# Patient Record
Sex: Male | Born: 1945 | Race: White | Hispanic: No | Smoking: Former smoker
Health system: Southern US, Community
[De-identification: ages and names within clinical notes are randomized; demographics above are authoritative.]

## PROBLEM LIST (undated history)

## (undated) DIAGNOSIS — S065XAA Traumatic subdural hemorrhage with loss of consciousness status unknown, initial encounter: Secondary | ICD-10-CM

## (undated) DIAGNOSIS — S065X9A Traumatic subdural hemorrhage with loss of consciousness of unspecified duration, initial encounter: Secondary | ICD-10-CM

## (undated) DIAGNOSIS — Z960 Presence of urogenital implants: Secondary | ICD-10-CM

## (undated) DIAGNOSIS — R569 Unspecified convulsions: Secondary | ICD-10-CM

## (undated) DIAGNOSIS — R339 Retention of urine, unspecified: Secondary | ICD-10-CM

## (undated) DIAGNOSIS — J439 Emphysema, unspecified: Secondary | ICD-10-CM

## (undated) DIAGNOSIS — F419 Anxiety disorder, unspecified: Secondary | ICD-10-CM

## (undated) DIAGNOSIS — Z978 Presence of other specified devices: Secondary | ICD-10-CM

---

## 2017-08-19 ENCOUNTER — Emergency Department
Admission: EM | Admit: 2017-08-19 | Discharge: 2017-08-19 | Disposition: A | Discharge status: Home / Self Care | Payer: Self-pay | Attending: Emergency Medicine | Admitting: Emergency Medicine

## 2017-08-19 ENCOUNTER — Encounter: Payer: Self-pay | Admitting: Emergency Medicine

## 2017-08-19 ENCOUNTER — Other Ambulatory Visit: Payer: Self-pay

## 2017-08-19 DIAGNOSIS — Z87891 Personal history of nicotine dependence: Secondary | ICD-10-CM | POA: Insufficient documentation

## 2017-08-19 DIAGNOSIS — R339 Retention of urine, unspecified: Secondary | ICD-10-CM | POA: Insufficient documentation

## 2017-08-19 LAB — URINALYSIS, COMPLETE (UACMP) WITH MICROSCOPIC
Bacteria, UA: NONE SEEN
Bilirubin Urine: NEGATIVE
GLUCOSE, UA: NEGATIVE mg/dL
HGB URINE DIPSTICK: NEGATIVE
Ketones, ur: NEGATIVE mg/dL
Leukocytes, UA: NEGATIVE
NITRITE: NEGATIVE
PH: 6 (ref 5.0–8.0)
PROTEIN: NEGATIVE mg/dL
SPECIFIC GRAVITY, URINE: 1.006 (ref 1.005–1.030)
Squamous Epithelial / LPF: NONE SEEN

## 2017-08-19 NOTE — ED Notes (Signed)
Pt stated he felt the urge to urinate. Pt taken to restroom but was unable to pass urine at this time. Pt did experience an episode of bowel incontinence before making it to the restroom.

## 2017-08-19 NOTE — ED Notes (Signed)
Bladder scan 273 ml

## 2017-08-19 NOTE — ED Notes (Signed)
  Esign not working. Pt verbalized discharge instructions and has no questions at this time. 

## 2017-08-19 NOTE — ED Triage Notes (Signed)
Pt presents to ED with urinary retention. Pt states he had a catheter removed around 1330 at the [facility] this afternoon and pt has been unable to void since then. Pt denies pain at this time.

## 2017-08-19 NOTE — ED Provider Notes (Signed)
[facility] Emergency Department Provider Note  ____________________________________________  Time seen: Approximately 11:01 PM  I have reviewed the triage vital signs and the nursing notes.   HISTORY  Chief Complaint Urinary Retention  Level 5 Caveat: Portions of the History and Physical are unable to be obtained due to patient being a poor historian   HPI Alexander Lara is a 71 y.o. male comes to the ED due to urinary retention. Patient previously had an indwelling Foley, had his Foley catheter removed today at about 1 PM [facility]. He was told if he didn't urinate within 4 hours he needed to come back to the [facility] for further care. However, after about 6 hours at home without being up to urinate, the patient came here instead. Denies any pain. No fevers chills or sweats. Otherwise at baseline, which does include confusion and PTSD. Patient has a history of enlarged prostate and according to the family member at bedside complicated history of side effects with medications.  He had been on a antibiotic for UTI for the past week, last doses today.  History reviewed. No pertinent past medical history. PTSD Enlarged prostate Urinary retention  There are no active problems to display for this patient.    History reviewed. No pertinent surgical history.   Prior to Admission medications   Not on File  Mirtazapine Tamsulosin   Allergies Patient has no known allergies.   No family history on file.  Social History Social History   Tobacco Use  . Smoking status: Former Games developermoker  . Smokeless tobacco: Never Used  Substance Use Topics  . Alcohol use: No    Frequency: Never  . Drug use: No    Review of Systems  Constitutional:   No fever or chills.  . Cardiovascular:   No chest pain or syncope. Respiratory:   No dyspnea or cough. Gastrointestinal:   Negative for abdominal pain, vomiting and diarrhea.   Musculoskeletal:   Negative for focal pain or swelling All other systems reviewed and are negative except as documented above in ROS and HPI.  ____________________________________________   PHYSICAL EXAM:  VITAL SIGNS: ED Triage Vitals  Enc Vitals Group     BP 08/19/17 2037 132/79     Pulse Rate 08/19/17 2037 78     Resp 08/19/17 2037 18     Temp 08/19/17 2037 (!) 97.5 F (36.4 C)     Temp Source 08/19/17 2037 Oral     SpO2 08/19/17 2037 100 %     Weight 08/19/17 2038 88 lb (39.9 kg)     Height 08/19/17 2038 5\' 6"  (1.676 m)     Head Circumference --      Peak Flow --      Pain Score 08/19/17 2037 0     Pain Loc --      Pain Edu? --      Excl. in GC? --     Vital signs reviewed, nursing assessments reviewed.   Constitutional:   Alert and oriented. Well appearing and in no distress. Eyes:   No scleral icterus.  EOMI. [name]. ENT   Head:   Normocephalic and atraumatic.   Nose:   No congestion/rhinnorhea.    Mouth/Throat:   MMM, no pharyngeal erythema. No peritonsillar mass.    Neck:   No meningismus. Full ROM. Hematological/Lymphatic/Immunilogical:   No cervical lymphadenopathy. Cardiovascular:   RRR. Symmetric bilateral radial and DP pulses.  No murmurs.  Respiratory:  Normal respiratory effort without tachypnea/retractions. Breath sounds are clear and equal bilaterally. No wheezes/rales/rhonchi. Gastrointestinal:   Soft and nontender. Bladder nonpalpable. Non distended. There is no CVA tenderness.  No rebound, rigidity, or guarding. Genitourinary:   deferred Musculoskeletal:   Normal range of motion in all extremities. No joint effusions.  No lower extremity tenderness.  No edema. Neurologic:   Normal speech and language.  Motor grossly intact. No gross focal neurologic deficits are appreciated.  Skin:    Skin is warm, dry and intact. No rash noted.  No petechiae, purpura, or bullae.  ____________________________________________    LABS (pertinent  positives/negatives) (all labs ordered are listed, but only abnormal results are displayed) Labs Reviewed  URINE CULTURE  URINALYSIS, COMPLETE (UACMP) WITH MICROSCOPIC   ____________________________________________   EKG    ____________________________________________    RADIOLOGY  No results found.  ____________________________________________   PROCEDURES Procedures  ____________________________________________     CLINICAL IMPRESSION / ASSESSMENT AND PLAN / ED COURSE  Pertinent labs & imaging results that were available during my care of the patient were reviewed by me and considered in my medical decision making (see chart for details).   Patient presents with urinary retention, going 8 hours without being able to urinate after removing a Foley today. Vital signs are unremarkable, patient is otherwise at apparent baseline. Bladder scan shows about 270 mL's. Patient was assisted to the bathroom a few times, was able to have bowel movements but not able to urinate. Therefore, Foley catheter was placed again. We'll send urinalysis for test of cure. Otherwise patient is suitable [facility] discharge home [facility]. Patient is not septic, he is well appearing and not in distress.      ____________________________________________   FINAL CLINICAL IMPRESSION(S) / ED DIAGNOSES    Final diagnoses:  Urinary retention      This SmartLink is deprecated. Use AVSMEDLIST instead to display the medication list for a patient.   Portions of this note were generated with dragon dictation software. Dictation errors may occur despite best attempts at proofreading.    Sharman CheekStafford, Nakya Weyand, MD 08/19/17 [phone]

## 2017-08-21 LAB — URINE CULTURE

## 2018-04-26 ENCOUNTER — Emergency Department
Admission: EM | Admit: 2018-04-26 | Discharge: 2018-04-26 | Disposition: A | Discharge status: Home / Self Care | Payer: Self-pay | Attending: Emergency Medicine | Admitting: Emergency Medicine

## 2018-04-26 ENCOUNTER — Other Ambulatory Visit: Payer: Self-pay

## 2018-04-26 DIAGNOSIS — Z466 Encounter for fitting and adjustment of urinary device: Secondary | ICD-10-CM | POA: Insufficient documentation

## 2018-04-26 DIAGNOSIS — Z87891 Personal history of nicotine dependence: Secondary | ICD-10-CM | POA: Insufficient documentation

## 2018-04-26 NOTE — ED Provider Notes (Signed)
[facility] Emergency Department Provider Note   ____________________________________________   First MD Initiated Contact with Patient 04/26/18 1341     (approximate)  I have reviewed the triage vital signs and the nursing notes.   HISTORY  Chief Complaint Urinary Retention  Chief complaint is needs Foley replaced HPI Alexander HamburgerRonnie Jamil is a 72 y.o. male who was at the nursing home he was scheduled to have his Foley catheter replaced.  They took out the open but could not get the new went into the center here.  Here in the emergency room we replaced the Foley without difficulty.  Patient tolerated procedure well.  We will send him back.   No past medical history on file.  There are no active problems to display for this patient.   No past surgical history on file.  Prior to Admission medications   Not on File    Allergies Patient has no known allergies.  No family history on file.  Social History Social History   Tobacco Use  . Smoking status: Former Games developermoker  . Smokeless tobacco: Never Used  Substance Use Topics  . Alcohol use: No    Frequency: Never  . Drug use: No    Review of Systems Constitutional patient reports he feels well.  Head patient denies sore throat.   Chest patient denies chest pain or shortness of breath.   Abdomen patient denies any significant abdominal pain.  ____________________________________________   PHYSICAL EXAM:  VITAL SIGNS: ED Triage Vitals  Enc Vitals Group     BP      Pulse      Resp      Temp      Temp src      SpO2      Weight      Height      Head Circumference      Peak Flow      Pain Score      Pain Loc      Pain Edu?      Excl. in GC?     Constitutional: Alert  Well appearing and in no acute distress. Eyes: Conjunctivae are normal. . Head: Atraumatic. Nose: No congestion/rhinnorhea. Mouth/Throat: Mucous membranes are moist.   Neck: No stridor Cardiovascular: Normal rate,  regular rhythm. Grossly normal heart sounds.  Good peripheral circulation. Respiratory: Normal respiratory effort.  No retractions. Lungs CTAB. Gastrointestinal: Soft minimal epigastric pain to deep palpation. No distention. No abdominal bruits.  Musculoskeletal: No lower extremity tenderness nor edema.   Neurologic:  Normal speech and language. No new gross focal neurologic deficits are appreciated. Skin:  Skin is warm, dry and intact.    ____________________________________________   LABS (all labs ordered are listed, but only abnormal results are displayed)  Labs Reviewed - No data to display ____________________________________________  EKG   ____________________________________________  RADIOLOGY  ED MD interpretation:   Official radiology report(s): No results found.  ____________________________________________   PROCEDURES  Procedure(s) performed:   Procedures  Critical Care performed:   ____________________________________________   INITIAL IMPRESSION / ASSESSMENT AND PLAN / ED COURSE  Foley catheter is replaced without difficulty patient tolerated procedure well we will return him to the nursing home for further care.         ____________________________________________   FINAL CLINICAL IMPRESSION(S) / ED DIAGNOSES  Final diagnoses:  Encounter for Foley catheter replacement     ED Discharge Orders    None       Note:  This document  was prepared using Conservation officer, historic buildingsDragon voice recognition software and may include unintentional dictation errors.    Arnaldo NatalMalinda, Sharon Rubis F, MD 04/26/18 [phone]

## 2018-04-26 NOTE — ED Triage Notes (Signed)
Pt arrived via ems with urinary retention in need of foley catheter placement. Pt NAD, respirations and non labored.

## 2018-04-26 NOTE — Discharge Instructions (Addendum)
Patient's Foley catheter has been replaced.  Please resume his regular care.

## 2018-05-14 ENCOUNTER — Encounter: Payer: Self-pay | Admitting: *Deleted

## 2018-05-14 ENCOUNTER — Emergency Department

## 2018-05-14 ENCOUNTER — Inpatient Hospital Stay
Admission: EM | Admit: 2018-05-14 | Discharge: 2018-05-18 | DRG: 698 | Disposition: A | Discharge status: Skilled Nursing Facility | Source: Skilled Nursing Facility | Attending: Internal Medicine | Admitting: Internal Medicine

## 2018-05-14 DIAGNOSIS — R338 Other retention of urine: Secondary | ICD-10-CM | POA: Diagnosis present

## 2018-05-14 DIAGNOSIS — G9341 Metabolic encephalopathy: Secondary | ICD-10-CM | POA: Diagnosis present

## 2018-05-14 DIAGNOSIS — N3000 Acute cystitis without hematuria: Secondary | ICD-10-CM | POA: Diagnosis present

## 2018-05-14 DIAGNOSIS — N390 Urinary tract infection, site not specified: Secondary | ICD-10-CM

## 2018-05-14 DIAGNOSIS — Y738 Miscellaneous gastroenterology and urology devices associated with adverse incidents, not elsewhere classified: Secondary | ICD-10-CM | POA: Diagnosis present

## 2018-05-14 DIAGNOSIS — Z87820 Personal history of traumatic brain injury: Secondary | ICD-10-CM

## 2018-05-14 DIAGNOSIS — E43 Unspecified severe protein-calorie malnutrition: Secondary | ICD-10-CM | POA: Diagnosis present

## 2018-05-14 DIAGNOSIS — Z79899 Other long term (current) drug therapy: Secondary | ICD-10-CM

## 2018-05-14 DIAGNOSIS — Z66 Do not resuscitate: Secondary | ICD-10-CM | POA: Diagnosis present

## 2018-05-14 DIAGNOSIS — D638 Anemia in other chronic diseases classified elsewhere: Secondary | ICD-10-CM | POA: Diagnosis present

## 2018-05-14 DIAGNOSIS — E876 Hypokalemia: Secondary | ICD-10-CM | POA: Diagnosis present

## 2018-05-14 DIAGNOSIS — Z7401 Bed confinement status: Secondary | ICD-10-CM

## 2018-05-14 DIAGNOSIS — B964 Proteus (mirabilis) (morganii) as the cause of diseases classified elsewhere: Secondary | ICD-10-CM | POA: Diagnosis present

## 2018-05-14 DIAGNOSIS — Z681 Body mass index (BMI) 19 or less, adult: Secondary | ICD-10-CM | POA: Diagnosis not present

## 2018-05-14 DIAGNOSIS — J439 Emphysema, unspecified: Secondary | ICD-10-CM | POA: Diagnosis present

## 2018-05-14 DIAGNOSIS — J189 Pneumonia, unspecified organism: Secondary | ICD-10-CM | POA: Diagnosis present

## 2018-05-14 DIAGNOSIS — G40909 Epilepsy, unspecified, not intractable, without status epilepticus: Secondary | ICD-10-CM | POA: Diagnosis present

## 2018-05-14 DIAGNOSIS — F419 Anxiety disorder, unspecified: Secondary | ICD-10-CM | POA: Diagnosis present

## 2018-05-14 DIAGNOSIS — A4159 Other Gram-negative sepsis: Secondary | ICD-10-CM | POA: Diagnosis present

## 2018-05-14 DIAGNOSIS — T83511A Infection and inflammatory reaction due to indwelling urethral catheter, initial encounter: Secondary | ICD-10-CM | POA: Diagnosis present

## 2018-05-14 DIAGNOSIS — Y846 Urinary catheterization as the cause of abnormal reaction of the patient, or of later complication, without mention of misadventure at the time of the procedure: Secondary | ICD-10-CM | POA: Diagnosis present

## 2018-05-14 DIAGNOSIS — A419 Sepsis, unspecified organism: Secondary | ICD-10-CM

## 2018-05-14 DIAGNOSIS — Z87891 Personal history of nicotine dependence: Secondary | ICD-10-CM

## 2018-05-14 DIAGNOSIS — Y95 Nosocomial condition: Secondary | ICD-10-CM | POA: Diagnosis present

## 2018-05-14 HISTORY — DX: Unspecified convulsions: R56.9

## 2018-05-14 HISTORY — DX: Retention of urine, unspecified: R33.9

## 2018-05-14 HISTORY — DX: Presence of urogenital implants: Z96.0

## 2018-05-14 HISTORY — DX: Emphysema, unspecified: J43.9

## 2018-05-14 HISTORY — DX: Presence of other specified devices: Z97.8

## 2018-05-14 HISTORY — DX: Traumatic subdural hemorrhage with loss of consciousness of unspecified duration, initial encounter: S06.5X9A

## 2018-05-14 HISTORY — DX: Anxiety disorder, unspecified: F41.9

## 2018-05-14 HISTORY — DX: Traumatic subdural hemorrhage with loss of consciousness status unknown, initial encounter: S06.5XAA

## 2018-05-14 LAB — COMPREHENSIVE METABOLIC PANEL
ALBUMIN: 2.6 g/dL — AB (ref 3.5–5.0)
ALK PHOS: 64 U/L (ref 38–126)
ALT: 15 U/L (ref 0–44)
ANION GAP: 12 (ref 5–15)
AST: 22 U/L (ref 15–41)
BILIRUBIN TOTAL: 0.5 mg/dL (ref 0.3–1.2)
BUN: 25 mg/dL — AB (ref 8–23)
CALCIUM: 8.2 mg/dL — AB (ref 8.9–10.3)
CO2: 25 mmol/L (ref 22–32)
Chloride: 108 mmol/L (ref 98–111)
Creatinine, Ser: 0.89 mg/dL (ref 0.61–1.24)
GFR calc Af Amer: >60 mL/min (ref >60)
GFR calc non Af Amer: >60 mL/min (ref >60)
GLUCOSE: 136 mg/dL — AB (ref 70–99)
Potassium: 2.6 mmol/L — CL (ref 3.5–5.1)
Sodium: 145 mmol/L (ref 135–145)
TOTAL PROTEIN: 6.2 g/dL — AB (ref 6.5–8.1)

## 2018-05-14 LAB — URINALYSIS, COMPLETE (UACMP) WITH MICROSCOPIC
Bilirubin Urine: NEGATIVE
Glucose, UA: NEGATIVE mg/dL
Ketones, ur: NEGATIVE mg/dL
Nitrite: POSITIVE — AB
PROTEIN: 100 mg/dL — AB
RBC / HPF: >50 RBC/hpf — ABNORMAL HIGH (ref 0–5)
Specific Gravity, Urine: 1.014 (ref 1.005–1.030)
Squamous Epithelial / LPF: NONE SEEN (ref 0–5)
pH: 8 (ref 5.0–8.0)

## 2018-05-14 LAB — LACTIC ACID, PLASMA: Lactic Acid, Venous: 2.3 mmol/L (ref 0.5–1.9)

## 2018-05-14 LAB — CBC WITH DIFFERENTIAL/PLATELET
Basophils Absolute: 0 10*3/uL (ref 0–0.1)
Basophils Relative: 0 %
EOS ABS: 0 10*3/uL (ref 0–0.7)
EOS PCT: 0 %
HCT: 24.3 % — ABNORMAL LOW (ref 40.0–52.0)
Hemoglobin: 8 g/dL — ABNORMAL LOW (ref 13.0–18.0)
LYMPHS ABS: 0.8 10*3/uL — AB (ref 1.0–3.6)
Lymphocytes Relative: 10 %
MCH: 24.2 pg — AB (ref 26.0–34.0)
MCHC: 32.8 g/dL (ref 32.0–36.0)
MCV: 73.8 fL — ABNORMAL LOW (ref 80.0–100.0)
MONO ABS: 0.4 10*3/uL (ref 0.2–1.0)
MONOS PCT: 5 %
Neutro Abs: 7.3 10*3/uL — ABNORMAL HIGH (ref 1.4–6.5)
Neutrophils Relative %: 85 %
Platelets: 289 10*3/uL (ref 150–440)
RBC: 3.29 MIL/uL — ABNORMAL LOW (ref 4.40–5.90)
RDW: 19.5 % — ABNORMAL HIGH (ref 11.5–14.5)
WBC: 8.5 10*3/uL (ref 3.8–10.6)

## 2018-05-14 LAB — PROTIME-INR
INR: 1.24
Prothrombin Time: 15.5 seconds — ABNORMAL HIGH (ref 11.4–15.2)

## 2018-05-14 LAB — LIPASE, BLOOD: Lipase: 23 U/L (ref 11–51)

## 2018-05-14 LAB — APTT: APTT: 38 s — AB (ref 24–36)

## 2018-05-14 LAB — TROPONIN I: Troponin I: 0.03 ng/mL (ref <0.03)

## 2018-05-14 MED ORDER — LIDOCAINE HCL URETHRAL/MUCOSAL 2 % EX GEL
CUTANEOUS | Status: AC
  Filled 2018-05-14: qty 10

## 2018-05-14 MED ORDER — VANCOMYCIN HCL IN DEXTROSE 1-5 GM/200ML-% IV SOLN
1000.0000 mg | Freq: Once | INTRAVENOUS | Status: AC
  Administered 2018-05-14: 1000 mg via INTRAVENOUS
  Filled 2018-05-14: qty 200

## 2018-05-14 MED ORDER — SODIUM CHLORIDE 0.9 % IV SOLN
2.0000 g | Freq: Once | INTRAVENOUS | Status: AC
  Administered 2018-05-14: 2 g via INTRAVENOUS
  Filled 2018-05-14: qty 2

## 2018-05-14 MED ORDER — SODIUM CHLORIDE 0.9 % IV BOLUS
1000.0000 mL | Freq: Once | INTRAVENOUS | Status: AC
  Administered 2018-05-14: 1000 mL via INTRAVENOUS

## 2018-05-14 MED ORDER — LIDOCAINE HCL URETHRAL/MUCOSAL 2 % EX GEL
1.0000 "application " | Freq: Once | CUTANEOUS | Status: AC
  Administered 2018-05-14: 1 via URETHRAL

## 2018-05-14 NOTE — ED Notes (Signed)
Foley catheter in place upon arrival draining tea-colored, cloudy urine that has a strong odor.

## 2018-05-14 NOTE — ED Notes (Signed)
Unable to insert Foley with gentle pressure with a 16Fr catheter. Blood noted on the catheter upon removal. Dr. Scotty CourtStafford aware and ordered Coude catheter.

## 2018-05-14 NOTE — Progress Notes (Signed)
CODE SEPSIS - PHARMACY COMMUNICATION  **Broad Spectrum Antibiotics should be administered within 1 hour of Sepsis diagnosis**  Time Code Sepsis Called/Page Received: @ 2048  Antibiotics Ordered: Cefepime  Time of 1st antibiotic administration: @ 2241  Additional action taken by pharmacy: N/A  If necessary, Name of Provider/Nurse Contacted: N/A  Gardner CandleSheema M Harini Dearmond, PharmD, BCPS Clinical Pharmacist 05/14/2018 10:41 PM

## 2018-05-14 NOTE — ED Provider Notes (Signed)
[facility] Emergency Department Provider Note  ____________________________________________  Time seen: Approximately 10:49 PM  I have reviewed the triage vital signs and the nursing notes.   HISTORY  Chief Complaint Dysuria  Level 5 Caveat: Portions of the History and Physical including HPI and review of systems are unable to be completely obtained due to patient being a poor historian    HPI Alexander Lara is a 72 y.o. 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  No reported acute symptoms.  Patient denies pain.  He reports feeling cold.  Catheter dysfunction started today.  At the facility they reported doing a bladder scan which showed 300 mL's in the bladder.      Past Medical History:  Diagnosis Date  . Anxiety   . Emphysema of lung (HCC)   . Foley catheter in place   . Seizures (HCC)   . Subdural hematoma (HCC)   . Urinary retention      There are no active problems to display for this patient.    History reviewed. No pertinent surgical history.   Prior to Admission medications   Not on File     Allergies Patient has no known allergies.   No family history on file.  Social History Social History   Tobacco Use  . Smoking status: Former Games developermoker  . Smokeless tobacco: Never Used  Substance Use Topics  . Alcohol use: No    Frequency: Never  . Drug use: No    Review of Systems  Constitutional:   No fever or chills.  Cardiovascular:   No chest pain or syncope. Respiratory:   No dyspnea or cough. Gastrointestinal:   Negative for abdominal pain, vomiting and diarrhea.  Musculoskeletal:   Negative for focal pain or swelling All other systems reviewed and are negative except as documented above in ROS and HPI.  ____________________________________________   PHYSICAL EXAM:  VITAL  SIGNS: ED Triage Vitals  Enc Vitals Group     BP 05/14/18 2043 (!) 153/83     Pulse Rate 05/14/18 2043 (!) 107     Resp 05/14/18 2043 20     Temp 05/14/18 2043 (!) 100.4 F (38 C)     Temp Source 05/14/18 2043 Rectal     SpO2 05/14/18 2043 96 %     Weight 05/14/18 2045 99 lb 3.3 oz (45 kg)     Height 05/14/18 2045 5\' 11"  (1.803 m)     Head Circumference --      Peak Flow --      Pain Score 05/14/18 2241 0     Pain Loc --      Pain Edu? --      Excl. in GC? --     Vital signs reviewed, nursing assessments reviewed.   Constitutional:   Alert and oriented.  Ill-appearing.  Emaciated Eyes:   Conjunctivae are normal. EOMI. PERRL. ENT      Head:   Normocephalic and atraumatic.      Nose:   No congestion/rhinnorhea.       Mouth/Throat:   Dry mucous membranes, no pharyngeal erythema. No peritonsillar mass.       Neck:   No meningismus. Full ROM. Hematological/Lymphatic/Immunilogical:   No cervical lymphadenopathy. Cardiovascular:   Tachycardia heart rate 130. Symmetric bilateral radial and DP pulses.  No murmurs. Cap refill less than 2 seconds.  Respiratory:   Normal respiratory effort without tachypnea/retractions. Breath sounds are clear and equal bilaterally. No wheezes/rales/rhonchi. Gastrointestinal:   Soft with suprapubic tenderness. Non distended. There is no CVA tenderness.  No rebound, rigidity, or guarding.  Rectal exam shows brown stool, prostate not significantly enlarged Genitourinary:   Normal, Foley catheter in place Musculoskeletal:   Normal range of motion in all extremities. No joint effusions.  No lower extremity tenderness.  No edema. Neurologic:   Dysarthric speech.  Limited language range Motor grossly intact. No acute focal neurologic deficits are appreciated.  Skin:    Skin is warm, dry and intact. No rash noted.  No petechiae, purpura, or bullae.  ____________________________________________    LABS (pertinent positives/negatives) (all labs ordered are  listed, but only abnormal results are displayed) Labs Reviewed  LACTIC ACID, PLASMA - Abnormal; Notable for the following components:      Result Value   Lactic Acid, Venous 2.3 (*)    All other components within normal limits  COMPREHENSIVE METABOLIC PANEL - Abnormal; Notable for the following components:   Potassium 2.6 (*)    Glucose, Bld 136 (*)    BUN 25 (*)    Calcium 8.2 (*)    Total Protein 6.2 (*)    Albumin 2.6 (*)    All other components within normal limits  TROPONIN I - Abnormal; Notable for the following components:   Troponin I 0.03 (*)    All other components within normal limits  CBC WITH DIFFERENTIAL/PLATELET - Abnormal; Notable for the following components:   RBC 3.29 (*)    Hemoglobin 8.0 (*)    HCT 24.3 (*)    MCV 73.8 (*)    MCH 24.2 (*)    RDW 19.5 (*)    Neutro Abs 7.3 (*)    Lymphs Abs 0.8 (*)    All other components within normal limits  APTT - Abnormal; Notable for the following components:   aPTT 38 (*)    All other components within normal limits  PROTIME-INR - Abnormal; Notable for the following components:   Prothrombin Time 15.5 (*)    All other components within normal limits  CULTURE, BLOOD (ROUTINE X 2)  CULTURE, BLOOD (ROUTINE X 2)  URINE CULTURE  LIPASE, BLOOD  LACTIC ACID, PLASMA  URINALYSIS, COMPLETE (UACMP) WITH MICROSCOPIC   ____________________________________________   EKG    ____________________________________________    RADIOLOGY  Dg Chest Port 1 View  Result Date: 05/14/2018 CLINICAL DATA:  72 year old male with difficulty with Foley catheter for several days. EXAM: PORTABLE CHEST 1 VIEW COMPARISON:  None. FINDINGS: Patchy multifocal ill-defined airspace consolidation in the right mid to lower lung. Left lung is normal. No pleural effusions. No pneumothorax. No definite suspicious appearing pulmonary nodules or masses are noted. No evidence of pulmonary edema. Heart size is normal. Aortic atherosclerosis. IMPRESSION:  1. Patchy multifocal airspace disease throughout the right mid to lower lung concerning for bronchopneumonia likely within predominantly the right lower lobe. Followup PA and lateral chest X-ray is recommended in 3-4 weeks following trial of antibiotic therapy to ensure resolution and exclude underlying malignancy. 2. Aortic atherosclerosis. Electronically Signed   By: Trudie Reed M.D.   On: 05/14/2018 21:57    ____________________________________________   PROCEDURES .Critical Care Performed by: Sharman Cheek, MD Authorized by: Sharman Cheek, MD   Critical care provider statement:    Critical care time (minutes):  35   Critical care time was exclusive of:  Separately billable procedures and treating other patients  Critical care was necessary to treat or prevent imminent or life-threatening deterioration of the following conditions:  Sepsis   Critical care was time spent personally by me on the following activities:  Development of treatment plan with patient or surrogate, discussions with consultants, evaluation of patient's response to treatment, examination of patient, obtaining history from patient or surrogate, ordering and performing treatments and interventions, ordering and review of laboratory studies, ordering and review of radiographic studies, pulse oximetry, re-evaluation of patient's condition and review of old charts   Foley catheter insertion Performed by me at 22:30pm Multiple failed attempts by nursing Under usual sterile technique, attempted to pass 16 french coude tip - unsuccessful.   Attempted to pass 20 french coude tip, which passed easily.   ____________________________________________  DIFFERENTIAL DIAGNOSIS   Urinary tract infection, pneumonia, sepsis, dehydration, Foley catheter obstruction  CLINICAL IMPRESSION / ASSESSMENT AND PLAN / ED COURSE  Pertinent labs & imaging results that were available during my care of the patient were reviewed  by me and considered in my medical decision making (see chart for details).    Patient presents with tachycardia, fever, ill-appearing, likely urinary tract infection with grossly colonized and contaminated Foley catheter tubing.  Plan to replace the catheter, sepsis work-up.  Saline bolus and cefepime for healthcare/catheter related UTI suspected.  Clinical Course as of May 14 2302  Sat May 14, 2018  2152 Labs show hypokalemia, elevated lactic acid without shock. Meets criteria for severe sepsis, likely due to cystitis. Will plan to hospitalize for further management. Prior foley removed, will attempt to replace new foley at this time.    [PS]  2243 Foley catheter inserted by me, 20 french coude tip, due to nursing unable to insert catheter and inability to pass a 16 french coude.   [PS]    Clinical Course User Index [PS] Sharman Cheek, MD     ----------------------------------------- 11:03 PM on 05/14/2018 -----------------------------------------  Chest x-ray shows right-sided pneumonia.  Will cover for H CAP by adding on vancomycin to the cefepime.  Case discussed with hospitalist.  ____________________________________________   FINAL CLINICAL IMPRESSION(S) / ED DIAGNOSES    Final diagnoses:  Acute cystitis without hematuria  Urinary tract infection associated with indwelling urethral catheter, initial encounter (HCC)  Sepsis, due to unspecified organism (HCC)  HCAP (healthcare-associated pneumonia)     ED Discharge Orders    None      Portions of this note were generated with dragon dictation software. Dictation errors may occur despite best attempts at proofreading.    Sharman Cheek, MD 05/14/18 2303

## 2018-05-14 NOTE — ED Notes (Signed)
Report from sonja, rn.  

## 2018-05-14 NOTE — ED Notes (Signed)
Unable to insert Coude due to resistance. Dr. Scotty CourtSTafford aware.

## 2018-05-14 NOTE — ED Notes (Signed)
Patient's significant other is at bedside

## 2018-05-14 NOTE — ED Triage Notes (Signed)
Per EMS report, staff at [facility] states patient has had issues with his Foley Catheter for 3 days. Bladder scan was performed at the facility and obtained findings of 300ml per EMT report.

## 2018-05-14 NOTE — ED Notes (Signed)
Dr. Scotty CourtStafford at bedside to insert Coude catheter.

## 2018-05-14 NOTE — H&P (Signed)
[facility] - [place] at [facility]   PATIENT NAME: Alexander Lara    MR#:  [id number]  DATE OF BIRTH:  [birth year]  DATE OF ADMISSION:  05/14/2018  PRIMARY CARE PHYSICIAN: [facility]   REQUESTING/REFERRING PHYSICIAN:   CHIEF COMPLAINT:   Chief Complaint  Patient presents with  . Dysuria    HISTORY OF PRESENT ILLNESS: Valmore Arabie  is a 72 y.o. male, NH resident, with a known history of COPD, subdural hematoma, chronic urinary catheter, due to urinary retention. Patient was sent to emergency room from nursing home due to urinary catheter dysfunction/obstruction  noted by nursing home staff in the past few hours.  No reports of bleeding, vomiting or diarrhea.  No recent falls or trauma. He is currently confused, obtunded, unable to provide history.  Most of the information was taken from reviewing the medical chart and from discussion with emergency room physician and patient's fiance. Blood test done emergency room are notable for hemoglobin level at 8, potassium level 2.6, lactic acid level at 2.3.  WBCs 8.5.  Temperature was 100.3 in the emergency room. UA is positive for UTI and chest x-ray shows patchy multifocal airspace disease throughout the right mid to lower lung concerning for bronchopneumonia. Patient is admitted for further evaluation and treatment.  PAST MEDICAL HISTORY:   Past Medical History:  Diagnosis Date  . Anxiety   . Emphysema of lung (HCC)   . Foley catheter in place   . Seizures (HCC)   . Subdural hematoma (HCC)   . Urinary retention     PAST SURGICAL HISTORY: History reviewed. No pertinent surgical history.  SOCIAL HISTORY:  Social History   Tobacco Use  . Smoking status: Former Games developer  . Smokeless tobacco: Never Used  Substance Use Topics  . Alcohol use: No    Frequency: Never    FAMILY HISTORY: No family history on file.  DRUG ALLERGIES: No Known Allergies  REVIEW OF SYSTEMS:   Via  MEDICATIONS AT  HOME:  Prior to Admission medications   Medication Sig Start Date End Date Taking? Authorizing Provider  levETIRAcetam (KEPPRA) 500 MG tablet Take 500 mg by mouth 2 (two) times daily.   Yes [provider]      PHYSICAL EXAMINATION:   VITAL SIGNS: Blood pressure (!) 159/74, pulse 88, temperature (!) 100.4 F (38 C), temperature source Rectal, resp. rate 20, height 5\' 11"  (1.803 m), weight 45 kg, SpO2 94 %.  GENERAL:  72 y.o.-year-old patient lying in the bed with no acute distress.  He looks chronically ill, cachectic. EYES: Pupils equal, round, reactive to light and accommodation. No scleral icterus. Extraocular muscles intact.  HEENT: Head atraumatic, normocephalic. Oropharynx and nasopharynx clear.  NECK:  Supple, no jugular venous distention. No thyroid enlargement, no tenderness.  LUNGS: Normal breath sounds bilaterally, no wheezing, rales,rhonchi or crepitation. No use of accessory muscles of respiration.  CARDIOVASCULAR: S1, S2 normal. No S3/S4.  ABDOMEN: Soft, nontender, nondistended. Bowel sounds present. No organomegaly or mass.  EXTREMITIES: No pedal edema, cyanosis, or clubbing.  NEUROLOGIC EXAM: Is limited due to patient's altered mental status.  He is moving all his extremities, no focal weakness appreciated. PSYCHIATRIC: The patient is obtunded. SKIN: No obvious rash, lesion, or ulcer.   LABORATORY PANEL:   CBC Recent Labs  Lab 05/14/18 2056  WBC 8.5  HGB 8.0*  HCT 24.3*  PLT 289  MCV 73.8*  MCH 24.2*  MCHC 32.8  RDW 19.5*  LYMPHSABS 0.8*  MONOABS 0.4  EOSABS 0.0  BASOSABS 0.0   ------------------------------------------------------------------------------------------------------------------  Chemistries  Recent Labs  Lab 05/14/18 2056  NA 145  K 2.6*  CL 108  CO2 25  GLUCOSE 136*  BUN 25*  CREATININE 0.89  CALCIUM 8.2*  AST 22  ALT 15  ALKPHOS 64  BILITOT 0.5    ------------------------------------------------------------------------------------------------------------------ estimated creatinine clearance is 47.1 mL/min (by C-G formula based on SCr of 0.89 mg/dL). ------------------------------------------------------------------------------------------------------------------ No results for input(s): TSH, T4TOTAL, T3FREE, THYROIDAB in the last 72 hours.  Invalid input(s): FREET3   Coagulation profile Recent Labs  Lab 05/14/18 2056  INR 1.24   ------------------------------------------------------------------------------------------------------------------- No results for input(s): DDIMER in the last 72 hours. -------------------------------------------------------------------------------------------------------------------  Cardiac Enzymes Recent Labs  Lab 05/14/18 2056  TROPONINI 0.03*   ------------------------------------------------------------------------------------------------------------------ Invalid input(s): POCBNP  ---------------------------------------------------------------------------------------------------------------  Urinalysis    Component Value Date/Time   COLORURINE AMBER (A) 05/14/2018 2232   APPEARANCEUR CLOUDY (A) 05/14/2018 2232   LABSPEC 1.014 05/14/2018 2232   PHURINE 8.0 05/14/2018 2232   GLUCOSEU NEGATIVE 05/14/2018 2232   HGBUR MODERATE (A) 05/14/2018 2232   BILIRUBINUR NEGATIVE 05/14/2018 2232   KETONESUR NEGATIVE 05/14/2018 2232   PROTEINUR 100 (A) 05/14/2018 2232   NITRITE POSITIVE (A) 05/14/2018 2232   LEUKOCYTESUR LARGE (A) 05/14/2018 2232     RADIOLOGY: Dg Chest Port 1 View  Result Date: 05/14/2018 CLINICAL DATA:  72 year old male with difficulty with Foley catheter for several days. EXAM: PORTABLE CHEST 1 VIEW COMPARISON:  None. FINDINGS: Patchy multifocal ill-defined airspace consolidation in the right mid to lower lung. Left lung is normal. No pleural effusions. No pneumothorax.  No definite suspicious appearing pulmonary nodules or masses are noted. No evidence of pulmonary edema. Heart size is normal. Aortic atherosclerosis. IMPRESSION: 1. Patchy multifocal airspace disease throughout the right mid to lower lung concerning for bronchopneumonia likely within predominantly the right lower lobe. Followup PA and lateral chest X-ray is recommended in 3-4 weeks following trial of antibiotic therapy to ensure resolution and exclude underlying malignancy. 2. Aortic atherosclerosis. Electronically Signed   By: Trudie Reed M.D.   On: 05/14/2018 21:57    EKG: Orders placed or performed during the hospital encounter of 05/14/18  . EKG 12-Lead  . EKG 12-Lead    IMPRESSION AND PLAN:  1.  Acute encephalopathy, likely secondary to sepsis.  We will continue to monitor clinically closely while treating the underlying disorder.  Will start resuscitation with IV fluids and broad-spectrum IV antibiotics. 2.  HCAP, will start treatment with cefepime and vancomycin IV, nebulizer treatments and oxygen therapy. 3.  Acute UTI.  Will treat with broad-spectrum IV antibiotics while waiting for urine culture result. 4.  Hypokalemia.  Will replace potassium per protocol. 5.  History of recent subdural hematoma.  Avoid anticoagulants.   All the records are reviewed and case discussed with ED provider. Management plans discussed with the patient, family and they are in agreement.  CODE STATUS: DNR Advance Directive Documentation     Most Recent Value  Type of Advance Directive  Out of facility DNR (pink MOST or yellow form)  Pre-existing out of facility DNR order (yellow form or pink MOST form)  -  "MOST" Form in Place?  -       TOTAL TIME TAKING CARE OF THIS PATIENT: 50 minutes.    Cammy Copa M.D on 05/14/2018 at 11:30 PM  Between 7am to 6pm - Pager - [phone]  After 6pm go to [email or web address] - password [email or web address] [facility]  [place] [facility]  Regional Office  [phone]  CC: Primary care physician; [facility]

## 2018-05-14 NOTE — ED Notes (Signed)
Bladder scan shows .

## 2018-05-15 ENCOUNTER — Other Ambulatory Visit: Payer: Self-pay

## 2018-05-15 LAB — MAGNESIUM
MAGNESIUM: 2.3 mg/dL (ref 1.7–2.4)
Magnesium: 1.6 mg/dL — ABNORMAL LOW (ref 1.7–2.4)

## 2018-05-15 LAB — CBC
HCT: 22.2 % — ABNORMAL LOW (ref 40.0–52.0)
Hemoglobin: 7.6 g/dL — ABNORMAL LOW (ref 13.0–18.0)
MCH: 25.1 pg — AB (ref 26.0–34.0)
MCHC: 34.1 g/dL (ref 32.0–36.0)
MCV: 73.4 fL — AB (ref 80.0–100.0)
Platelets: 253 10*3/uL (ref 150–440)
RBC: 3.02 MIL/uL — ABNORMAL LOW (ref 4.40–5.90)
RDW: 19.9 % — AB (ref 11.5–14.5)
WBC: 9 10*3/uL (ref 3.8–10.6)

## 2018-05-15 LAB — BASIC METABOLIC PANEL
Anion gap: 8 (ref 5–15)
BUN: 21 mg/dL (ref 8–23)
CO2: 23 mmol/L (ref 22–32)
CREATININE: 0.71 mg/dL (ref 0.61–1.24)
Calcium: 7.9 mg/dL — ABNORMAL LOW (ref 8.9–10.3)
Chloride: 112 mmol/L — ABNORMAL HIGH (ref 98–111)
GFR calc Af Amer: >60 mL/min (ref >60)
GFR calc non Af Amer: >60 mL/min (ref >60)
Glucose, Bld: 98 mg/dL (ref 70–99)
POTASSIUM: 3.9 mmol/L (ref 3.5–5.1)
Sodium: 143 mmol/L (ref 135–145)

## 2018-05-15 LAB — PHOSPHORUS: PHOSPHORUS: 2.2 mg/dL — AB (ref 2.5–4.6)

## 2018-05-15 LAB — MRSA PCR SCREENING: MRSA by PCR: NEGATIVE

## 2018-05-15 LAB — GLUCOSE, CAPILLARY: Glucose-Capillary: 89 mg/dL (ref 70–99)

## 2018-05-15 LAB — LACTIC ACID, PLASMA: LACTIC ACID, VENOUS: 1.7 mmol/L (ref 0.5–1.9)

## 2018-05-15 MED ORDER — DOCUSATE SODIUM 100 MG PO CAPS
100.0000 mg | ORAL_CAPSULE | Freq: Two times a day (BID) | ORAL | Status: DC
  Administered 2018-05-15 – 2018-05-16 (×2): 100 mg via ORAL
  Filled 2018-05-15 (×3): qty 1

## 2018-05-15 MED ORDER — LEVETIRACETAM 100 MG/ML PO SOLN
500.0000 mg | Freq: Two times a day (BID) | ORAL | Status: DC
  Administered 2018-05-15 – 2018-05-18 (×7): 500 mg via ORAL
  Filled 2018-05-15 (×9): qty 5

## 2018-05-15 MED ORDER — IPRATROPIUM-ALBUTEROL 0.5-2.5 (3) MG/3ML IN SOLN
3.0000 mL | Freq: Four times a day (QID) | RESPIRATORY_TRACT | Status: DC
  Administered 2018-05-15 (×2): 3 mL via RESPIRATORY_TRACT
  Filled 2018-05-15 (×2): qty 3

## 2018-05-15 MED ORDER — IPRATROPIUM-ALBUTEROL 0.5-2.5 (3) MG/3ML IN SOLN: 3.0000 mL | RESPIRATORY_TRACT | Status: DC | PRN

## 2018-05-15 MED ORDER — HYDROCODONE-ACETAMINOPHEN 5-325 MG PO TABS
1.0000 | ORAL_TABLET | ORAL | Status: DC | PRN
  Administered 2018-05-16: 2 via ORAL
  Administered 2018-05-16: 1 via ORAL
  Administered 2018-05-16: 2 via ORAL
  Administered 2018-05-17: 1 via ORAL
  Administered 2018-05-17 – 2018-05-18 (×2): 2 via ORAL
  Filled 2018-05-15 (×4): qty 2
  Filled 2018-05-15: qty 1
  Filled 2018-05-15: qty 2

## 2018-05-15 MED ORDER — TAMSULOSIN HCL 0.4 MG PO CAPS
0.4000 mg | ORAL_CAPSULE | Freq: Every day | ORAL | Status: DC
  Administered 2018-05-15 – 2018-05-18 (×4): 0.4 mg via ORAL
  Filled 2018-05-15 (×4): qty 1

## 2018-05-15 MED ORDER — VITAMIN C 500 MG PO TABS
250.0000 mg | ORAL_TABLET | Freq: Two times a day (BID) | ORAL | Status: DC
  Administered 2018-05-15 – 2018-05-18 (×7): 250 mg via ORAL
  Filled 2018-05-15 (×8): qty 0.5

## 2018-05-15 MED ORDER — LEVETIRACETAM 500 MG PO TABS
500.0000 mg | ORAL_TABLET | Freq: Two times a day (BID) | ORAL | Status: DC
  Filled 2018-05-15: qty 1

## 2018-05-15 MED ORDER — POTASSIUM CHLORIDE 10 MEQ/100ML IV SOLN
10.0000 meq | INTRAVENOUS | Status: AC
  Administered 2018-05-15 (×6): 10 meq via INTRAVENOUS
  Filled 2018-05-15 (×6): qty 100

## 2018-05-15 MED ORDER — SODIUM CHLORIDE 0.9 % IV SOLN
2.0000 g | INTRAVENOUS | Status: DC
  Administered 2018-05-15 – 2018-05-16 (×2): 2 g via INTRAVENOUS
  Filled 2018-05-15 (×3): qty 2

## 2018-05-15 MED ORDER — IPRATROPIUM-ALBUTEROL 0.5-2.5 (3) MG/3ML IN SOLN
3.0000 mL | Freq: Three times a day (TID) | RESPIRATORY_TRACT | Status: DC
  Administered 2018-05-15 – 2018-05-18 (×10): 3 mL via RESPIRATORY_TRACT
  Filled 2018-05-15 (×10): qty 3

## 2018-05-15 MED ORDER — BISACODYL 5 MG PO TBEC: 5.0000 mg | DELAYED_RELEASE_TABLET | Freq: Every day | ORAL | Status: DC | PRN

## 2018-05-15 MED ORDER — ADULT MULTIVITAMIN W/MINERALS CH
1.0000 | ORAL_TABLET | Freq: Every day | ORAL | Status: DC
  Administered 2018-05-15 – 2018-05-17 (×2): 1 via ORAL
  Filled 2018-05-15 (×3): qty 1

## 2018-05-15 MED ORDER — TRAZODONE HCL 50 MG PO TABS
25.0000 mg | ORAL_TABLET | Freq: Every evening | ORAL | Status: DC | PRN
  Administered 2018-05-16 – 2018-05-17 (×2): 25 mg via ORAL
  Filled 2018-05-15 (×2): qty 1

## 2018-05-15 MED ORDER — HEPARIN SODIUM (PORCINE) 5000 UNIT/ML IJ SOLN
5000.0000 [IU] | Freq: Three times a day (TID) | INTRAMUSCULAR | Status: DC
  Administered 2018-05-15 – 2018-05-16 (×4): 5000 [IU] via SUBCUTANEOUS
  Filled 2018-05-15 (×8): qty 1

## 2018-05-15 MED ORDER — ONDANSETRON HCL 4 MG PO TABS: 4.0000 mg | ORAL_TABLET | Freq: Four times a day (QID) | ORAL | Status: DC | PRN

## 2018-05-15 MED ORDER — ACETAMINOPHEN 650 MG RE SUPP: 650.0000 mg | Freq: Four times a day (QID) | RECTAL | Status: DC | PRN

## 2018-05-15 MED ORDER — MAGNESIUM SULFATE 2 GM/50ML IV SOLN
2.0000 g | Freq: Once | INTRAVENOUS | Status: AC
  Administered 2018-05-15: 2 g via INTRAVENOUS
  Filled 2018-05-15: qty 50

## 2018-05-15 MED ORDER — ACETAMINOPHEN 325 MG PO TABS
650.0000 mg | ORAL_TABLET | Freq: Four times a day (QID) | ORAL | Status: DC | PRN
  Administered 2018-05-15: 650 mg via ORAL
  Filled 2018-05-15: qty 2

## 2018-05-15 MED ORDER — ONDANSETRON HCL 4 MG/2ML IJ SOLN: 4.0000 mg | Freq: Four times a day (QID) | INTRAMUSCULAR | Status: DC | PRN

## 2018-05-15 MED ORDER — SODIUM CHLORIDE 0.9 % IV SOLN
INTRAVENOUS | Status: DC
  Administered 2018-05-15 – 2018-05-17 (×5): via INTRAVENOUS

## 2018-05-15 MED ORDER — NEPRO/CARBSTEADY PO LIQD
237.0000 mL | Freq: Two times a day (BID) | ORAL | Status: DC
  Administered 2018-05-16 – 2018-05-18 (×5): 237 mL via ORAL

## 2018-05-15 MED ORDER — VANCOMYCIN HCL IN DEXTROSE 750-5 MG/150ML-% IV SOLN
750.0000 mg | INTRAVENOUS | Status: DC
  Administered 2018-05-15: 750 mg via INTRAVENOUS
  Filled 2018-05-15 (×2): qty 150

## 2018-05-15 MED ORDER — POTASSIUM PHOSPHATE MONOBASIC 500 MG PO TABS
500.0000 mg | ORAL_TABLET | ORAL | Status: AC
  Administered 2018-05-15 – 2018-05-16 (×4): 500 mg via ORAL
  Filled 2018-05-15 (×4): qty 1

## 2018-05-15 NOTE — Progress Notes (Signed)
DNR form was sent up from ED - came with patient from facility. Spoke to patient and fiance, Levon Hedger, about the form due to conflicting "full code" order in computer. Patient stated that he did not want to be a DNR - he wanted "full code" status and understood clearly what that meant when discussed. Full code is currently what is in the system - placed DNR form in chart to be sent back with patient upon discharge.

## 2018-05-15 NOTE — Consult Note (Signed)
PHARMACY CONSULT NOTE - INITIAL   Pharmacy Consult for Electrolyte Monitoring and Replacement   Indication: Hypokalemia   Labs: Recent Labs    05/14/18 2056  WBC 8.5  HGB 8.0*  HCT 24.3*  PLT 289  APTT 38*  CREATININE 0.89  MG 1.6*  ALBUMIN 2.6*  PROT 6.2*  AST 22  ALT 15  ALKPHOS 64  BILITOT 0.5   Potassium (mmol/L)  Date Value  05/14/2018 2.6 (LL)   Magnesium (mg/dL)  Date Value  09/98/3382 1.6 (L)   Calcium (mg/dL)  Date Value  50/53/9767 8.2 (L)   Albumin (g/dL)  Date Value  34/19/3790 2.6 (L)  ] Estimated Creatinine Clearance: 47.1 mL/min (by C-G formula based on SCr of 0.89 mg/dL).  Assessment: Pharmacy consulted for electrolyte monitoring and replacement in 1 male admitted with sepsis and found to have hypokalemia.   Goal of Therapy:  Electrolyte WNL   Plan:  8/31 PM Labs: K: 2.6, Mg: 1.6  Will order Magnesium 2g IV x 1 dose and KCL IV x 6.  Will recheck electrolyte @ 1000 on 9/1.  Pharmacy will continue to follow labs and replace electrolyte as needed.   Gardner Candle, PharmD, BCPS Clinical Pharmacist 05/15/2018 12:32 AM

## 2018-05-15 NOTE — Consult Note (Signed)
Pharmacy Antibiotic Note  Alexander Lara is a 72 y.o. male admitted on 05/14/2018 with sepsis secondary to PNA and UTI.  Pharmacy has been consulted for Vancomycin and Cefepime dosing. Patient received Vancomycin 1g IV and Cefepime 2g IV x 2 dose in ED.   Plan: Start Vancomycin 750 IV every 24 hours with 16 hour stack dosing.  Goal trough 15-20 mcg/mL. Calculated trough @ Css is 17. Trough level prior to 4th dose. Will monitor renal function and adjust dose as needed. MRSA PCR ordered- if negative, recommend discontinuation of vancomycin   Start Cefepime 2g IV every 24 hours.   Height: 5\' 11"  (180.3 cm) Weight: 99 lb 3.3 oz (45 kg) IBW/kg (Calculated) : 75.3  Temp (24hrs), Avg:100.4 F (38 C), Min:100.4 F (38 C), Max:100.4 F (38 C)  Recent Labs  Lab 05/14/18 2056  WBC 8.5  CREATININE 0.89  LATICACIDVEN 2.3*    Estimated Creatinine Clearance: 47.1 mL/min (by C-G formula based on SCr of 0.89 mg/dL).    No Known Allergies  Antimicrobials this admission: 8/31  Cefepime >>  8/31 Vancomycin >>   Microbiology results: 8/31 BCx: pending  8/31 UCx: pending 8/31 MRSA PCR: pending  Thank you for allowing pharmacy to be a part of this patient's care.  Gardner Candle, PharmD, BCPS Clinical Pharmacist 05/15/2018 12:27 AM

## 2018-05-15 NOTE — Progress Notes (Signed)
[facility] - [place] at [facility]                                                                                                                                                                                  Patient Demographics   Markevius Trombetta, is a 72 y.o. male, DOB - 10-24-1945, WUJ:[id number]  Admit date - 05/14/2018   Admitting Physician Cammy Copa, MD  Outpatient Primary MD [facility] the [facility]   LOS - 1  Subjective: Pt admitted with urinary retention due to foley being clogged, according to patient's fianc she had asked not to have his Foley changed at the facility due to patient having trouble with Foley placement before.  Patient this morning is more awake complains of just not feeling well.  Does not have any specific complaints    Review of Systems:   CONSTITUTIONAL: No documented fever. No fatigue, weakness. No weight gain, no weight loss.  EYES: No blurry or double vision.  ENT: No tinnitus. No postnasal drip. No redness of the oropharynx.  RESPIRATORY: No cough, no wheeze, no hemoptysis. No dyspnea.  CARDIOVASCULAR: No chest pain. No orthopnea. No palpitations. No syncope.  GASTROINTESTINAL: No nausea, no vomiting or diarrhea. No abdominal pain. No melena or hematochezia.  GENITOURINARY: No dysuria or hematuria.  ENDOCRINE: No polyuria or nocturia. No heat or cold intolerance.  HEMATOLOGY: No anemia. No bruising. No bleeding.  INTEGUMENTARY: No rashes. No lesions.  MUSCULOSKELETAL: No arthritis. No swelling. No gout.  NEUROLOGIC: No numbness, tingling, or ataxia. No seizure-type activity.  PSYCHIATRIC: No anxiety. No insomnia. No ADD.    Vitals:   Vitals:   05/15/18 0119 05/15/18 0425 05/15/18 0809 05/15/18 1218  BP: (!) 151/81 133/72  (!) 141/75  Pulse: 90 94 98 92  Resp: 18 18 16 18   Temp: 97.8 F (36.6 C) 98.5 F (36.9 C)  98.5 F (36.9 C)  TempSrc: Oral Oral    SpO2: 99% 97% 98% 98%  Weight:  43 kg     Height:        Wt Readings from Last 3 Encounters:  05/15/18 43 kg  04/26/18 45.4 kg  08/19/17 39.9 kg     Intake/Output Summary (Last 24 hours) at 05/15/2018 1241 Last data filed at 05/15/2018 9562 Gross per 24 hour  Intake 1674.87 ml  Output 550 ml  Net 1124.87 ml    Physical Exam:   GENERAL:chronically ill-appearing HEAD, EYES, EARS, NOSE AND THROAT: Atraumatic, normocephalic. Extraocular muscles are intact. Pupils equal and reactive to light. Sclerae anicteric. No conjunctival injection. No oro-pharyngeal erythema.  NECK: Supple. There is no jugular venous distention. No bruits, no lymphadenopathy,  no thyromegaly.  HEART: Regular rate and rhythm,. No murmurs, no rubs, no clicks.  LUNGS: Clear to auscultation bilaterally. No rales or rhonchi. No wheezes.  ABDOMEN: Soft, flat, nontender, nondistended. Has good bowel sounds. No hepatosplenomegaly appreciated.  EXTREMITIES: No evidence of any cyanosis, clubbing, or peripheral edema.  +2 pedal and radial pulses bilaterally.  NEUROLOGIC: The patient is alert, awake, and oriented x3 with no focal motor or sensory deficits appreciated bilaterally.  SKIN: Moist and warm with no rashes appreciated.  Psych: Not anxious, depressed LN: No inguinal LN enlargement    Antibiotics   Anti-infectives (From admission, onward)   Start     Dose/Rate Route Frequency Ordered Stop   05/15/18 2200  ceFEPIme (MAXIPIME) 2 g in sodium chloride 0.9 % 100 mL IVPB     2 g 200 mL/hr over 30 Minutes Intravenous Every 24 hours 05/15/18 0022     05/15/18 1600  vancomycin (VANCOCIN) IVPB 750 mg/150 ml premix     750 mg 150 mL/hr over 60 Minutes Intravenous Every 24 hours 05/15/18 0022     05/14/18 2315  vancomycin (VANCOCIN) IVPB 1000 mg/200 mL premix     1,000 mg 200 mL/hr over 60 Minutes Intravenous  Once 05/14/18 2302 05/15/18 0031   05/14/18 2100  ceFEPIme (MAXIPIME) 2 g in sodium chloride 0.9 % 100 mL IVPB     2 g 200 mL/hr over 30 Minutes  Intravenous  Once 05/14/18 2048 05/14/18 2253      Medications   Scheduled Meds: . docusate sodium  100 mg Oral BID  . feeding supplement (NEPRO CARB STEADY)  237 mL Oral BID BM  . heparin  5,000 Units Subcutaneous Q8H  . ipratropium-albuterol  3 mL Nebulization Q6H  . levETIRAcetam  500 mg Oral BID  . multivitamin with minerals  1 tablet Oral Daily  . tamsulosin  0.4 mg Oral Daily  . vitamin C  250 mg Oral BID   Continuous Infusions: . sodium chloride Stopped (05/15/18 0235)  . ceFEPime (MAXIPIME) IV    . potassium chloride 10 mEq (05/15/18 1226)  . vancomycin     PRN Meds:.acetaminophen **OR** acetaminophen, bisacodyl, HYDROcodone-acetaminophen, ondansetron **OR** ondansetron (ZOFRAN) IV, traZODone   Data Review:   Micro Results Recent Results (from the past 240 hour(s))  Blood Culture (routine x 2)     Status: None (Preliminary result)   Collection Time: 05/14/18  8:56 PM  Result Value Ref Range Status   Specimen Description BLOOD LEFT FOREARM  Final   Special Requests   Final    BOTTLES DRAWN AEROBIC AND ANAEROBIC Blood Culture adequate volume   Culture   Final    NO GROWTH < 12 HOURS Performed at [facility], [address] [place], [place] [address]    Report Status PENDING  Incomplete  Blood Culture (routine x 2)     Status: None (Preliminary result)   Collection Time: 05/14/18  9:15 PM  Result Value Ref Range Status   Specimen Description BLOOD RIGHT FOREARM  Final   Special Requests   Final    BOTTLES DRAWN AEROBIC AND ANAEROBIC Blood Culture adequate volume   Culture   Final    NO GROWTH < 12 HOURS Performed at [facility], [address], [place], [place] [address]    Report Status PENDING  Incomplete  MRSA PCR Screening     Status: None   Collection Time: 05/15/18  2:00 AM  Result Value Ref Range Status   MRSA by PCR  NEGATIVE NEGATIVE Final    Comment:        The GeneXpert MRSA Assay (FDA approved for NASAL specimens only),  is one component of a comprehensive MRSA colonization surveillance program. It is not intended to diagnose MRSA infection nor to guide or monitor treatment for MRSA infections. Performed at [facility], [address], [place], [place] [address]     Radiology Reports Dg Chest [place] 1 View  Result Date: 05/14/2018 CLINICAL DATA:  72 year old male with difficulty with Foley catheter for several days. EXAM: PORTABLE CHEST 1 VIEW COMPARISON:  None. FINDINGS: Patchy multifocal ill-defined airspace consolidation in the right mid to lower lung. Left lung is normal. No pleural effusions. No pneumothorax. No definite suspicious appearing pulmonary nodules or masses are noted. No evidence of pulmonary edema. Heart size is normal. Aortic atherosclerosis. IMPRESSION: 1. Patchy multifocal airspace disease throughout the right mid to lower lung concerning for bronchopneumonia likely within predominantly the right lower lobe. Followup PA and lateral chest X-ray is recommended in 3-4 weeks following trial of antibiotic therapy to ensure resolution and exclude underlying malignancy. 2. Aortic atherosclerosis. Electronically Signed   By: Trudie Reed M.D.   On: 05/14/2018 21:57     CBC Recent Labs  Lab 05/14/18 2056  WBC 8.5  HGB 8.0*  HCT 24.3*  PLT 289  MCV 73.8*  MCH 24.2*  MCHC 32.8  RDW 19.5*  LYMPHSABS 0.8*  MONOABS 0.4  EOSABS 0.0  BASOSABS 0.0    Chemistries  Recent Labs  Lab 05/14/18 2056  NA 145  K 2.6*  CL 108  CO2 25  GLUCOSE 136*  BUN 25*  CREATININE 0.89  CALCIUM 8.2*  MG 1.6*  AST 22  ALT 15  ALKPHOS 64  BILITOT 0.5   ------------------------------------------------------------------------------------------------------------------ estimated creatinine clearance is 45 mL/min (by C-G formula based on SCr of 0.89 mg/dL). ------------------------------------------------------------------------------------------------------------------ No results for  input(s): HGBA1C in the last 72 hours. ------------------------------------------------------------------------------------------------------------------ No results for input(s): CHOL, HDL, LDLCALC, TRIG, CHOLHDL, LDLDIRECT in the last 72 hours. ------------------------------------------------------------------------------------------------------------------ No results for input(s): TSH, T4TOTAL, T3FREE, THYROIDAB in the last 72 hours.  Invalid input(s): FREET3 ------------------------------------------------------------------------------------------------------------------ No results for input(s): VITAMINB12, FOLATE, FERRITIN, TIBC, IRON, RETICCTPCT in the last 72 hours.  Coagulation profile Recent Labs  Lab 05/14/18 2056  INR 1.24    No results for input(s): DDIMER in the last 72 hours.  Cardiac Enzymes Recent Labs  Lab 05/14/18 2056  TROPONINI 0.03*   ------------------------------------------------------------------------------------------------------------------ Invalid input(s): POCBNP    Assessment & Plan  Patient is 72 year old admitted with acute encephalopathy  1.  Acute encephalopathy due to sepsis-mental status now improved 2.    Sepsis due to H CAP as well as UTI 3. HCAP,  continue cefepime and vancomycin IV, nebulizer treatments and oxygen therapy. 4.  Acute UTI.    Await urine cultures I will start Flomax 4.  Hypokalemia.    Pharmacy consult for replacement of potassium electrolyte 5.  History of recent subdural hematoma.  Avoid anticoagulants.   Stable     Code Status Orders  (From admission, onward)         Start     Ordered   05/15/18 1121  Full code  Continuous     05/15/18 1120        Code Status History    Date Active Date Inactive Code Status Order ID Comments User Context   05/15/2018 0125 05/15/2018 1120 Full Code [id number]  Cammy Copa, MD Inpatient    Advance Directive  Documentation     Most Recent Value  Type of Advance Directive   Healthcare Power of Attorney  Pre-existing out of facility DNR order (yellow form or pink MOST form)  -  "MOST" Form in Place?  -           Consults none  DVT Prophylaxis SCDs  Lab Results  Component Value Date   PLT 289 05/14/2018     Time Spent in minutes   35 minutes greater than 50% of time spent in care coordination and counseling patient regarding the condition and plan of care.   Auburn Bilberry M.D on 05/15/2018 at 12:41 PM  Between 7am to 6pm - Pager - [phone]  After 6pm go to [email or web address] - Social research officer, government  [facility]   Office  [phone]

## 2018-05-15 NOTE — Consult Note (Signed)
PHARMACY CONSULT NOTE - INITIAL   Pharmacy Consult for Electrolyte Monitoring and Replacement   Indication: Hypokalemia   Labs: Recent Labs    05/14/18 2056 05/15/18 1235  WBC 8.5 9.0  HGB 8.0* 7.6*  HCT 24.3* 22.2*  PLT 289 253  APTT 38*  --   CREATININE 0.89 0.71  MG 1.6*  --   PHOS  --  2.2*  ALBUMIN 2.6*  --   PROT 6.2*  --   AST 22  --   ALT 15  --   ALKPHOS 64  --   BILITOT 0.5  --    Potassium (mmol/L)  Date Value  05/15/2018 3.9   Magnesium (mg/dL)  Date Value  06/06/3006 1.6 (L)   Calcium (mg/dL)  Date Value  62/26/3335 7.9 (L)   Albumin (g/dL)  Date Value  45/62/5638 2.6 (L)   Phosphorus (mg/dL)  Date Value  93/73/4287 2.2 (L)  ] Estimated Creatinine Clearance: 50 mL/min (by C-G formula based on SCr of 0.71 mg/dL).  Assessment: Pharmacy consulted for electrolyte monitoring and replacement in 45 male admitted with sepsis and found to have hypokalemia.   Goal of Therapy:  Electrolyte WNL   Plan:  Repeat BMP shows K now 3.9, phos 2.2 Will give KPhos neutra tab 500 mg po Q4H x 4 doses and recheck all electrolytes tomorrow with AM labs.   Carola Frost, PharmD, BCPS Clinical Pharmacist 05/15/2018 1:43 PM

## 2018-05-15 NOTE — Progress Notes (Signed)
Initial Nutrition Assessment  DOCUMENTATION CODES:   Severe malnutrition in context of chronic illness  INTERVENTION:   Recommend check iron/anemia labs including iron, transferrin, TIBC and ferritin.  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  GOAL:   Patient will meet greater than or equal to 90% of their needs  MONITOR:   PO intake, Supplement acceptance, Labs, Weight trends, Skin, I & O's  REASON FOR ASSESSMENT:   Malnutrition Screening Tool    ASSESSMENT:   72y.o. [facility], with a known history of COPD, subdural hematoma, chronic urinary catheter admitted for UTI, HCAP with sepsis    Met with pt in room today. Pt is a poor historian but reports good appetite and oral intake at baseline. Pt reports that he does drink chocolate Ensure. Per chart, pt appears to have weight gain pta but there is no recent weight history in chart to confirm. RD will order supplements and vitamins to help pt meet his estimated needs. Suspect pt is at high refeeding risk given his severe malnutrition. Pt would likely benefit from PEG tube placement and nutrition support. Pt noted to have microcytic anemia; recommend check iron/anemia labs including iron, transferrin, TIBC and ferritin to r/t deficiency.   Medications reviewed and include: colace, heparin, NaCl _0 /hr, cefepime, vancomycin  Labs reviewed: K 2.6(L), BUN 25(H), Ca 8.2(L), Mg 1.6(L) Hgb 8.0(L), Hct 24.3(L), MCV 73.8(L), MCH 24.2(L)  NUTRITION - FOCUSED PHYSICAL EXAM:    Most Recent Value  Orbital Region  Severe depletion  Upper Arm Region   Severe depletion  Thoracic and Lumbar Region  Severe depletion  Buccal Region  Severe depletion  Temple Region  Severe depletion  Clavicle Bone Region  Severe depletion  Clavicle and Acromion Bone Region  Severe depletion  Scapular Bone Region  Severe depletion  Dorsal Hand  Severe depletion  Patellar Region  Severe depletion  Anterior Thigh Region  Severe depletion  Posterior Calf Region  Severe depletion  Edema (RD Assessment)  None  Hair  Reviewed  Eyes  Reviewed  Mouth  Reviewed  Skin  Reviewed [[email or web address]]  Nails  Reviewed     Diet Order:   Diet Order            DIET - DYS 1 Room service appropriate? Yes; Fluid consistency: Nectar Thick  Diet effective now             EDUCATION NEEDS:   No education needs have been identified at this time  Skin:  Skin Assessment: Reviewed RN Assessment(ecchymosis )  Last BM:  8/29  Height:   Ht Readings from Last 1 Encounters:  05/14/18 _1  (1.803 m)    Weight:   Wt Readings from Last 1 Encounters:  05/15/18 43 kg    Ideal Body Weight:  78.2 kg  BMI:  Body mass index is 13.22 kg/m.  Estimated Nutritional Needs:   Kcal:  1600-1800kcal/day   Protein:  65-73g/day   Fluid:  >1.2L/day   CKoleen DistanceMS, RD, LDN Pager #- [phone]#- [phone] Hours Pager: [phone]

## 2018-05-15 NOTE — Progress Notes (Signed)
Advanced care plan.  Purpose of the Encounter: CODE STATUS  Parties in Attendance: Patient and his fiance  Patient's Decision Capacity: Intact  Subjective/Patient's story: Patient has a history of subdural hematoma, who is at nursing facility  I was requested to review patient's CODE STATUS by patient and fianc he was previously DNR  Objective/Medical story  I discussed with the patient regarding his desire for cardiac and pulmonary resuscitation  Goals of care determination: Full code    CODE STATUS: Patient reversed his CODE STATUS to full code   Time spent discussing advanced care planning: 16 minutes

## 2018-05-16 LAB — BASIC METABOLIC PANEL
Anion gap: 7 (ref 5–15)
BUN: 19 mg/dL (ref 8–23)
CALCIUM: 7.9 mg/dL — AB (ref 8.9–10.3)
CO2: 23 mmol/L (ref 22–32)
CREATININE: 0.69 mg/dL (ref 0.61–1.24)
Chloride: 113 mmol/L — ABNORMAL HIGH (ref 98–111)
GFR calc non Af Amer: >60 mL/min (ref >60)
Glucose, Bld: 95 mg/dL (ref 70–99)
Potassium: 3.8 mmol/L (ref 3.5–5.1)
SODIUM: 143 mmol/L (ref 135–145)

## 2018-05-16 LAB — PHOSPHORUS: PHOSPHORUS: 2.2 mg/dL — AB (ref 2.5–4.6)

## 2018-05-16 LAB — GLUCOSE, CAPILLARY: Glucose-Capillary: 77 mg/dL (ref 70–99)

## 2018-05-16 LAB — MAGNESIUM: MAGNESIUM: 2.1 mg/dL (ref 1.7–2.4)

## 2018-05-16 MED ORDER — POTASSIUM PHOSPHATES 15 MMOLE/5ML IV SOLN
20.0000 mmol | Freq: Once | INTRAVENOUS | Status: AC
  Administered 2018-05-16: 20 mmol via INTRAVENOUS
  Filled 2018-05-16: qty 6.67

## 2018-05-16 MED ORDER — MEGESTROL ACETATE 400 MG/10ML PO SUSP
800.0000 mg | Freq: Every day | ORAL | Status: DC
  Administered 2018-05-16 – 2018-05-18 (×3): 800 mg via ORAL
  Filled 2018-05-16 (×3): qty 20

## 2018-05-16 MED ORDER — DOCUSATE SODIUM 50 MG/5ML PO LIQD
100.0000 mg | Freq: Two times a day (BID) | ORAL | Status: DC
  Administered 2018-05-16 – 2018-05-18 (×4): 100 mg via ORAL
  Filled 2018-05-16 (×5): qty 10

## 2018-05-16 NOTE — Consult Note (Signed)
PHARMACY CONSULT NOTE  Pharmacy Consult for Electrolyte Monitoring and Replacement   Indication: Hypokalemia   Labs: Recent Labs    05/14/18 2056 05/15/18 1235 05/16/18 0359  WBC 8.5 9.0  --   HGB 8.0* 7.6*  --   HCT 24.3* 22.2*  --   PLT 289 253  --   APTT 38*  --   --   CREATININE 0.89 0.71 0.69  MG 1.6* 2.3 2.1  PHOS  --  2.2* 2.2*  ALBUMIN 2.6*  --   --   PROT 6.2*  --   --   AST 22  --   --   ALT 15  --   --   ALKPHOS 64  --   --   BILITOT 0.5  --   --    Potassium (mmol/L)  Date Value  05/16/2018 3.8   Magnesium (mg/dL)  Date Value  26/71/2458 2.1   Calcium (mg/dL)  Date Value  09/98/3382 7.9 (L)   Albumin (g/dL)  Date Value  50/53/9767 2.6 (L)   Phosphorus (mg/dL)  Date Value  34/19/3790 2.2 (L)  ] Estimated Creatinine Clearance: 43.6 mL/min (by C-G formula based on SCr of 0.69 mg/dL).  Assessment: Pharmacy consulted for electrolyte monitoring and replacement in 57 male admitted with sepsis and found to have hypokalemia.   Goal of Therapy:  Electrolyte WNL   Plan:  Repeat BMP shows K now 3.9, phos 2.2 Will give KPhos neutra tab 500 mg po Q4H x 4 doses and recheck all electrolytes tomorrow with AM labs.   9/2  K 3.8 Phos 2.2 Mag 2.1  Scr 0.69 Patient received all doses of previous PO KPHos order. Will order Potassium Phosphate 20 mmol IV x 1. Patient on Dysphagia 1 diet. Will f/u with am labs  Angelique Blonder, PharmD, BCPS Clinical Pharmacist 05/16/2018 7:32 AM

## 2018-05-16 NOTE — NC FL2 (Addendum)
[place] MEDICAID FL2 LEVEL OF CARE SCREENING TOOL     IDENTIFICATION  Patient Name: Alexander Lara Birthdate: [birth year] Sex: male Admission Date (Current Location): 05/14/2018  [place] and [place] Number:  Chiropodist and Address:  [facility], [address], [place], [place] [address]      Provider Number: [id number]  Attending Physician Name and Address:  Auburn Bilberry, MD  Relative Name and Phone Number:       Current Level of Care: Hospital Recommended Level of Care: Skilled Nursing Facility Prior Approval Number:    Date Approved/Denied:   PASRR Number:    Discharge Plan: SNF    Current Diagnoses: Patient Active Problem List   Diagnosis Date Noted  . Sepsis (HCC) 05/14/2018    Orientation RESPIRATION BLADDER Height & Weight     Self  Normal Incontinent Weight: 82 lb 10.8 oz (37.5 kg) Height:  5\' 11"  (180.3 cm)  BEHAVIORAL SYMPTOMS/MOOD NEUROLOGICAL BOWEL NUTRITION STATUS  (none) (none) Incontinent Diet(dysphagia 1)  AMBULATORY STATUS COMMUNICATION OF NEEDS Skin   Extensive Assist Verbally Normal                       Personal Care Assistance Level of Assistance  Dressing, Feeding, Bathing Bathing Assistance: Maximum assistance Feeding assistance: Maximum assistance Dressing Assistance: Maximum assistance     Functional Limitations Info             SPECIAL CARE FACTORS FREQUENCY                       Contractures Contractures Info: Not present    Additional Factors Info  Code Status Code Status Info: full             Current Medications (05/16/2018):  This is the current hospital active medication list Current Facility-Administered Medications  Medication Dose Route Frequency Provider Last Rate Last Dose  . 0.9 %  sodium chloride infusion   Intravenous Continuous Cammy Copa, MD 75 mL/hr at 05/16/18 0747    . acetaminophen (TYLENOL) tablet 650 mg  650 mg Oral Q6H PRN Cammy Copa, MD    650 mg at 05/15/18 1330   Or  . acetaminophen (TYLENOL) suppository 650 mg  650 mg Rectal Q6H PRN Cammy Copa, MD      . bisacodyl (DULCOLAX) EC tablet 5 mg  5 mg Oral Daily PRN Cammy Copa, MD      . ceFEPIme (MAXIPIME) 2 g in sodium chloride 0.9 % 100 mL IVPB  2 g Intravenous Q24H Gardner Candle, RPH   Stopped at 05/15/18 2320  . docusate sodium (COLACE) capsule 100 mg  100 mg Oral BID Cammy Copa, MD   100 mg at 05/16/18 1130  . feeding supplement (NEPRO CARB STEADY) liquid 237 mL  237 mL Oral BID BM Auburn Bilberry, MD   237 mL at 05/16/18 1131  . heparin injection 5,000 Units  5,000 Units Subcutaneous Q8H Cammy Copa, MD   5,000 Units at 05/16/18 1347  . HYDROcodone-acetaminophen (NORCO/VICODIN) 5-325 MG per tablet 1-2 tablet  1-2 tablet Oral Q4H PRN Cammy Copa, MD   2 tablet at 05/16/18 0457  . ipratropium-albuterol (DUONEB) 0.5-2.5 (3) MG/3ML nebulizer solution 3 mL  3 mL Nebulization TID Auburn Bilberry, MD   3 mL at 05/16/18 1343  . ipratropium-albuterol (DUONEB) 0.5-2.5 (3) MG/3ML nebulizer solution 3 mL  3 mL Nebulization Q4H PRN Auburn Bilberry, MD      . levETIRAcetam (KEPPRA)  100 MG/ML solution 500 mg  500 mg Oral BID Auburn Bilberry, MD   500 mg at 05/16/18 1131  . megestrol (MEGACE) 400 MG/10ML suspension 800 mg  800 mg Oral Daily Auburn Bilberry, MD   800 mg at 05/16/18 1130  . multivitamin with minerals tablet 1 tablet  1 tablet Oral Daily Auburn Bilberry, MD   1 tablet at 05/15/18 1224  . ondansetron (ZOFRAN) tablet 4 mg  4 mg Oral Q6H PRN Cammy Copa, MD       Or  . ondansetron [facility] injection 4 mg  4 mg Intravenous Q6H PRN Cammy Copa, MD      . potassium PHOSPHATE 20 mmol in dextrose 5 % 500 mL infusion  20 mmol Intravenous Once Auburn Bilberry, MD 84 mL/hr at 05/16/18 0836 20 mmol at 05/16/18 0836  . tamsulosin (FLOMAX) capsule 0.4 mg  0.4 mg Oral Daily Auburn Bilberry, MD   0.4 mg at 05/16/18 1130  . traZODone (DESYREL) tablet 25 mg  25 mg Oral QHS PRN  Cammy Copa, MD      . vitamin C (ASCORBIC ACID) tablet 250 mg  250 mg Oral BID Auburn Bilberry, MD   250 mg at 05/16/18 1130     Discharge Medications: Please see discharge summary for a list of discharge medications.  Relevant Imaging Results:  Relevant Lab Results:   Additional Information Followed By [facility], LCSW

## 2018-05-16 NOTE — Progress Notes (Signed)
[employer] [facility] [place] [facility]                                                                                                                                                                                  Patient Demographics   Alexander Lara, is a 72 y.o. male, DOB - 1946-02-20, QZE:[id number]  Admit date - 05/14/2018   Admitting Physician Cammy Copa, MD  Outpatient Primary MD for the [facility] is [facility]   LOS - 2  Subjective: Patient doing better currently has no complaints Still has some cough  Review of Systems:   CONSTITUTIONAL: No documented fever. No fatigue, weakness. No weight gain, no weight loss.  EYES: No blurry or double vision.  ENT: No tinnitus. No postnasal drip. No redness of the oropharynx.  RESPIRATORY: No cough, no wheeze, no hemoptysis. No dyspnea.  CARDIOVASCULAR: No chest pain. No orthopnea. No palpitations. No syncope.  GASTROINTESTINAL: No nausea, no vomiting or diarrhea. No abdominal pain. No melena or hematochezia.  GENITOURINARY: No dysuria or hematuria.  ENDOCRINE: No polyuria or nocturia. No heat or cold intolerance.  HEMATOLOGY: No anemia. No bruising. No bleeding.  INTEGUMENTARY: No rashes. No lesions.  MUSCULOSKELETAL: No arthritis. No swelling. No gout.  NEUROLOGIC: No numbness, tingling, or ataxia. No seizure-type activity.  PSYCHIATRIC: No anxiety. No insomnia. No ADD.    Vitals:   Vitals:   05/15/18 2203 05/16/18 0434 05/16/18 0810 05/16/18 1146  BP: 138/79 (!) 159/84  (!) 146/87  Pulse: 85 87  89  Resp: 20 20  12   Temp: 97.8 F (36.6 C) 98.3 F (36.8 C)  97.8 F (36.6 C)  TempSrc: Oral Oral  Oral  SpO2: 99% 99% 99% 97%  Weight:  37.5 kg    Height:        Wt Readings from Last 3 Encounters:  05/16/18 37.5 kg  04/26/18 45.4 kg  08/19/17 39.9 kg     Intake/Output Summary (Last 24 hours) at 05/16/2018 1302 Last data filed at 05/16/2018 1150 Gross per 24 hour  Intake 1645.74 ml  Output 1000  ml  Net 645.74 ml    Physical Exam:   GENERAL:chronically ill-appearing HEAD, EYES, EARS, NOSE AND THROAT: Atraumatic, normocephalic. Extraocular muscles are intact. Pupils equal and reactive to light. Sclerae anicteric. No conjunctival injection. No oro-pharyngeal erythema.  NECK: Supple. There is no jugular venous distention. No bruits, no lymphadenopathy, no thyromegaly.  HEART: Regular rate and rhythm,. No murmurs, no rubs, no clicks.  LUNGS: Clear to auscultation bilaterally. No rales or rhonchi. No wheezes.  ABDOMEN: Soft, flat, nontender, nondistended. Has good bowel sounds. No hepatosplenomegaly appreciated.  EXTREMITIES: No evidence of any cyanosis,  clubbing, or peripheral edema.  +2 pedal and radial pulses bilaterally.  NEUROLOGIC: The patient is alert, awake, and oriented x3 with no focal motor or sensory deficits appreciated bilaterally.  SKIN: Moist and warm with no rashes appreciated.  Psych: Not anxious, depressed LN: No inguinal LN enlargement    Antibiotics   Anti-infectives (From admission, onward)   Start     Dose/Rate Route Frequency Ordered Stop   05/15/18 2200  ceFEPIme (MAXIPIME) 2 g in sodium chloride 0.9 % 100 mL IVPB     2 g 200 mL/hr over 30 Minutes Intravenous Every 24 hours 05/15/18 0022     05/15/18 1600  vancomycin (VANCOCIN) IVPB 750 mg/150 ml premix  Status:  Discontinued     750 mg 150 mL/hr over 60 Minutes Intravenous Every 24 hours 05/15/18 0022 05/16/18 1023   05/14/18 2315  vancomycin (VANCOCIN) IVPB 1000 mg/200 mL premix     1,000 mg 200 mL/hr over 60 Minutes Intravenous  Once 05/14/18 2302 05/15/18 0031   05/14/18 2100  ceFEPIme (MAXIPIME) 2 g in sodium chloride 0.9 % 100 mL IVPB     2 g 200 mL/hr over 30 Minutes Intravenous  Once 05/14/18 2048 05/14/18 2253      Medications   Scheduled Meds: . docusate sodium  100 mg Oral BID  . feeding supplement (NEPRO CARB STEADY)  237 mL Oral BID BM  . heparin  5,000 Units Subcutaneous Q8H  .  ipratropium-albuterol  3 mL Nebulization TID  . levETIRAcetam  500 mg Oral BID  . megestrol  800 mg Oral Daily  . multivitamin with minerals  1 tablet Oral Daily  . tamsulosin  0.4 mg Oral Daily  . vitamin C  250 mg Oral BID   Continuous Infusions: . sodium chloride 75 mL/hr at 05/16/18 0747  . ceFEPime (MAXIPIME) IV Stopped (05/15/18 2320)  . potassium PHOSPHATE IVPB (in mmol) 20 mmol (05/16/18 0836)   PRN Meds:.acetaminophen **OR** acetaminophen, bisacodyl, HYDROcodone-acetaminophen, ipratropium-albuterol, ondansetron **OR** ondansetron (ZOFRAN) IV, traZODone   Data Review:   Micro Results Recent Results (from the past 240 hour(s))  Blood Culture (routine x 2)     Status: None (Preliminary result)   Collection Time: 05/14/18  8:56 PM  Result Value Ref Range Status   Specimen Description BLOOD LEFT FOREARM  Final   Special Requests   Final    BOTTLES DRAWN AEROBIC AND ANAEROBIC Blood Culture adequate volume   Culture   Final    NO GROWTH 2 DAYS Performed at [facility], [address], [place], [place] [address]    Report Status PENDING  Incomplete  Blood Culture (routine x 2)     Status: None (Preliminary result)   Collection Time: 05/14/18  9:15 PM  Result Value Ref Range Status   Specimen Description BLOOD RIGHT FOREARM  Final   Special Requests   Final    BOTTLES DRAWN AEROBIC AND ANAEROBIC Blood Culture adequate volume   Culture   Final    NO GROWTH 2 DAYS Performed at [facility], [address] [place], [place] [address]    Report Status PENDING  Incomplete  Urine culture     Status: Abnormal (Preliminary result)   Collection Time: 05/14/18 10:32 PM  Result Value Ref Range Status   Specimen Description   Final    URINE, RANDOM Performed at [facility], [address] [place], [place] [address]    Special Requests   Final    NONE Performed at [facility], [address], [place], [place] [address]    Culture (A)   Final    >=100,000 COLONIES/mL PROTEUS MIRABILIS SUSCEPTIBILITIES TO FOLLOW Performed at [facility], [address] [place], [place] [address]    Report Status PENDING  Incomplete  MRSA PCR Screening     Status: None   Collection Time: 05/15/18  2:00 AM  Result Value Ref Range Status   MRSA by PCR NEGATIVE NEGATIVE Final    Comment:        The GeneXpert MRSA Assay (FDA approved for NASAL specimens only), is one component of a comprehensive MRSA colonization surveillance program. It is not intended to diagnose MRSA infection nor to guide or monitor treatment for MRSA infections. Performed at [facility], [address], [place], [place] [address]     Radiology Reports Dg Chest [place] 1 View  Result Date: 05/14/2018 CLINICAL DATA:  72 year old male with difficulty with Foley catheter for several days. EXAM: PORTABLE CHEST 1 VIEW COMPARISON:  None. FINDINGS: Patchy multifocal ill-defined airspace consolidation in the right mid to lower lung. Left lung is normal. No pleural effusions. No pneumothorax. No definite suspicious appearing pulmonary nodules or masses are noted. No evidence of pulmonary edema. Heart size is normal. Aortic atherosclerosis. IMPRESSION: 1. Patchy multifocal airspace disease throughout the right mid to lower lung concerning for bronchopneumonia likely within predominantly the right lower lobe. Followup PA and lateral chest X-ray is recommended in 3-4 weeks following trial of antibiotic therapy to ensure resolution and exclude underlying malignancy. 2. Aortic atherosclerosis. Electronically Signed   By: Trudie Reed M.D.   On: 05/14/2018 21:57     CBC Recent Labs  Lab 05/14/18 2056 05/15/18 1235  WBC 8.5 9.0  HGB 8.0* 7.6*  HCT 24.3* 22.2*  PLT 289 253  MCV 73.8* 73.4*  MCH 24.2* 25.1*  MCHC 32.8 34.1  RDW 19.5* 19.9*  LYMPHSABS 0.8*  --   MONOABS 0.4  --   EOSABS 0.0  --   BASOSABS 0.0  --     Chemistries  Recent Labs  Lab  05/14/18 2056 05/15/18 1235 05/16/18 0359  NA 145 143 143  K 2.6* 3.9 3.8  CL 108 112* 113*  CO2 25 23 23   GLUCOSE 136* 98 95  BUN 25* 21 19  CREATININE 0.89 0.71 0.69  CALCIUM 8.2* 7.9* 7.9*  MG 1.6* 2.3 2.1  AST 22  --   --   ALT 15  --   --   ALKPHOS 64  --   --   BILITOT 0.5  --   --    ------------------------------------------------------------------------------------------------------------------ estimated creatinine clearance is 43.6 mL/min (by C-G formula based on SCr of 0.69 mg/dL). ------------------------------------------------------------------------------------------------------------------ No results for input(s): HGBA1C in the last 72 hours. ------------------------------------------------------------------------------------------------------------------ No results for input(s): CHOL, HDL, LDLCALC, TRIG, CHOLHDL, LDLDIRECT in the last 72 hours. ------------------------------------------------------------------------------------------------------------------ No results for input(s): TSH, T4TOTAL, T3FREE, THYROIDAB in the last 72 hours.  Invalid input(s): FREET3 ------------------------------------------------------------------------------------------------------------------ No results for input(s): VITAMINB12, FOLATE, FERRITIN, TIBC, IRON, RETICCTPCT in the last 72 hours.  Coagulation profile Recent Labs  Lab 05/14/18 2056  INR 1.24    No results for input(s): DDIMER in the last 72 hours.  Cardiac Enzymes Recent Labs  Lab 05/14/18 2056  TROPONINI 0.03*   ------------------------------------------------------------------------------------------------------------------ Invalid input(s): POCBNP    Assessment & Plan  Patient is 72 year old admitted with acute encephalopathy  1.  Acute encephalopathy due to sepsis-mental status now improved 2.    Sepsis  due to H CAP as well as UTI 3. HCAP,  continue cefepime discontinue vancomycin. 4.  Acute UTI.     With Proteus sensitive to cefepime continue that 4.  Hypokalemia.    Now replaced continue to monitor 5.  History of recent subdural hematoma.  Avoid full dose anticoagulants.   Stable     Code Status Orders  (From admission, onward)         Start     Ordered   05/15/18 1121  Full code  Continuous     05/15/18 1120        Code Status History    Date Active Date Inactive Code Status Order ID Comments User Context   05/15/2018 0125 05/15/2018 1120 Full Code [id number]  Cammy Copa, MD Inpatient    Advance Directive Documentation     Most Recent Value  Type of Advance Directive  Healthcare Power of Attorney  Pre-existing out of facility DNR order (yellow form or pink MOST form)  -  "MOST" Form in Place?  -           Consults none  DVT Prophylaxis heparin  Lab Results  Component Value Date   PLT 253 05/15/2018     Time Spent in minutes   35 minutes greater than 50% of time spent in care coordination and counseling patient regarding the condition and plan of care.   Auburn Bilberry M.D on 05/16/2018 at 1:02 PM  Between 7am to 6pm - Pager - [phone]  After 6pm go to [email or web address] - Social research officer, government  [employer]   Office  [phone]

## 2018-05-16 NOTE — Care Management (Signed)
Informed that Levon Hedger, significant other, would like Mr. Stupp transferred to [facility]. Spoke with Ms. Womack at the bedside. States that [employer], [place] (son) has power of attorney. States that son lives in [place], unable to find son's telephone number.  States that  Mr Gregori fell last November 2018 and was independent at that time. Larey Seat again March 2018. He became dependant on others for his basic needs at that time. Mr. Mahoney was at [facility] from March 19 th until June 12 th. He could get up and walk around at that time. States he gained 10 pounds, now at 76 pounds. Transferred to [facility] June 12 th.  [facility] is in place at [facility]. States that Hospice is suppose to release their services by the end of September. [facility] could [facility].  Spoke with Mr. Derbyshire at the bedside. Discussed transfer. Unable to make a decision at this time. Mr Bisbee hasn't been declared incompetent .  States that if Mr. Moreta can't transfer to [facility], she is O.K with going back to [facility] with an appetite stimulate. Gwenette Greet RN MSN CCM Care Management [phone]

## 2018-05-16 NOTE — Progress Notes (Signed)
Pt unable to tolerate incentive spirometer.

## 2018-05-16 NOTE — Evaluation (Signed)
Clinical/Bedside Swallow Evaluation Patient Details  Name: Alexander Lara MRN: [id number] Date of Birth: 03/29/1946  Today's Date: 05/16/2018 Time: SLP Start Time (ACUTE ONLY): 1010 SLP Stop Time (ACUTE ONLY): 1110 SLP Time Calculation (min) (ACUTE ONLY): 60 min  Past Medical History:  Past Medical History:  Diagnosis Date  . Anxiety   . Emphysema of lung (HCC)   . Foley catheter in place   . Seizures (HCC)   . Subdural hematoma (HCC)   . Urinary retention    Past Surgical History: History reviewed. No pertinent surgical history. HPI:   Pt is a 72 y.o. male, NH resident, with a known history of COPD, subdural hematoma, chronic urinary catheter, due to urinary retention, seizures, anxiety. UA is positive for UTI and chest x-ray shows patchy multifocal airspace disease throughout the right mid to lower lung concerning for bronchopneumonia. Pt has a baseline of [facility]roph[facility] a dysphagia diet.   Assessment / Plan / Recommendation Clinical Impression  Pt appears to present w/ moderate oropharyngeal phase dysphagia which suspect is pt's baseline as per family's description of his status at the SNF. Per family, pt had been declining since late 2018 then another fall and subdural hematoma w/ chronic urinary catheter due to urinary retention ~5 months ago. Pt has been receiving full care for ADLs since then at SNF; significant weight loss w/ discussion of PEG placement for nutritional support per Dietician note, Hospice services at [facility]. Per family member, pt has been taking both Nectar and Honey consistency liquids at SNF, puree foods w/ feeding assistance. Pt is unable to wear his dentures. Pt consumed few TSP trials of Nectar liquids by TSP and purees w/ no immediate, overt s/s of aspiration and no decline in respiratory presentation during/post po trials. Pt was nonverbal so unable to assess vocal quality. Pt exhibited moderate oral phase deficits as well c/b increased  oral phase time w/ bolus trials, lingual smacking in effort to transfer boluses posteriorly, oral holding and/or pharyngeal holding d/t delay in pharyngeal swallow initiation. Pt was able to achieve oral clearing it appeared given time and increased lingual movements and swallowing. Slight anterior leakage noted d/t lower labial protrusion/forward position at baseline. A pharyngeal swallow was appreciated w/ all boluses but delayed but pt did require verbal/tactile cues x2 to complete/attend to the swallow. Pt was sleepy during session, nonverbal. NO unilateral oromotor weakness noted during bolus trials; generalized weakness and decreased attention to task noted. Recommend continue dysphagia lvel 1 (PUREE) w/ NECTAR liquids by tsp/cup w/ strict aspiration precautions; Pills Crushed in puree; full feeding assistance. Recommend f/u by Dietician for potential need for alternative means of feeding to meet nutritional needs safely, fully (SLP agrees). Recommend f/u by Palliative Care services for goals of care. MD/NSG updated.  SLP Visit Diagnosis: Dysphagia, oropharyngeal phase (R13.12)    Aspiration Risk  Moderate aspiration risk;Risk for inadequate nutrition/hydration(baseline per report)    Diet Recommendation     Medication Administration: Crushed with puree(for safer swallowing)    Other  Recommendations Recommended Consults: (Dietician f/u; Palliative Care/Hospice services) Oral Care Recommendations: Oral care BID;Staff/trained caregiver to provide oral care Other Recommendations: Order thickener from pharmacy;Prohibited food (jello, ice cream, thin soups);Remove water pitcher;Have oral suction available   Follow up Recommendations Skilled Nursing facility      Frequency and Duration min 2x/week  1 week       Prognosis Prognosis for Safe Diet Advancement: Guarded Barriers to Reach Goals: Cognitive deficits;Severity of deficits;Time post onset  Swallow Study   General Date of Onset:  05/14/18 Type of Study: Bedside Swallow Evaluation Previous Swallow Assessment: during his admission to the [place]; then at [facility](SNF) Diet Prior to this Study: Dysphagia 1 (puree);Nectar-thick liquids(baseline ) Temperature Spikes Noted: No(wbc 9.0) Respiratory Status: Room air History of Recent Intubation: No Behavior/Cognition: Cooperative;Confused;Lethargic/Drowsy;Distractible;Requires cueing(non verbal) Oral Cavity Assessment: Dry Oral Care Completed by SLP: Recent completion by staff Oral Cavity - Dentition: Missing dentition(no upper dentition; not wearing dentures d/t weight loss) Vision: (n/a) Self-Feeding Abilities: Total assist Patient Positioning: Upright in bed(positioned w/ pillows) Baseline Vocal Quality: (nonverbal) Volitional Cough: Cognitively unable to elicit Volitional Swallow: Unable to elicit    Oral/Motor/Sensory Function Overall Oral Motor/Sensory Function: Generalized oral weakness(baseline lower lip protrusion, chin forward position) Facial Symmetry: (appeared wfl) Lingual ROM: Within Functional Limits(appeared) Lingual Symmetry: Within Functional Limits(appeared)   Ice Chips Ice chips: Not tested   Thin Liquid Thin Liquid: Not tested    Nectar Thick Nectar Thick Liquid: Impaired Presentation: Spoon(6 trials) Oral Phase Impairments: Poor awareness of bolus(increased lingual smacking movements; holding) Oral phase functional implications: Oral holding;Prolonged oral transit Pharyngeal Phase Impairments: Suspected delayed Swallow(delayed swallowing post pharyngeal holding suspected)   Honey Thick Honey Thick Liquid: Not tested   Puree Puree: Impaired Presentation: Spoon(fed; 4 trials) Oral Phase Impairments: Poor awareness of bolus(increased lingual smacking; similar to trials of Nectar) Oral Phase Functional Implications: Oral holding;Prolonged oral transit Pharyngeal Phase Impairments: Suspected delayed Swallow   Solid     Solid: Not tested       Watson,Katherine 05/16/2018,11:19 AM

## 2018-05-16 NOTE — Clinical Social Work Note (Signed)
Clinical Social Work Assessment  Patient Details  Name: Alexander Lara MRN: [id number] Date of Birth: 1945/10/17  Date of referral:  05/16/18               Reason for consult:  Discharge Planning                Permission sought to share information with:    Permission granted to share information::     Name::        Agency::     Relationship::     Contact Information:     Housing/Transportation Living arrangements for the past 2 months:  Skilled Nursing Facility Source of Information:  Facility Patient Interpreter Needed:  None Criminal Activity/Legal Involvement Pertinent to Current Situation/Hospitalization:  No - Comment as needed Significant Relationships:  Siblings, Significant Other Lives with:  Facility Resident Do you feel safe going back to the place where you live?  Yes Need for family participation in patient care:  Yes (Comment)  Care giving concerns:  Patient is long term care at [facility]. Patient also is followed by hospice.   Social Worker assessment / plan:  Patient is from [facility] and will return at discharge with hospice services already in place. Alexander Lara at [facility] states [facility] and [facility]. Alexander Lara stated that patient's significant other is: Alexander Lara: [phone]. And that his sister is: Alexander Lara: [phone].  Employment status:    Insurance information:    PT Recommendations:  Not assessed at this time Information / Referral to community resources:     Patient/Family's Response to care:  Patient's significant other was wanting to try and get patient transferred to the [place] and spoke to RN CM but was told that the [place] is on diversion.  Patient/Family's Understanding of and Emotional Response to Diagnosis, Current Treatment, and Prognosis:  She understands patient's prognosis.  Emotional Assessment Appearance:  Appears stated age Attitude/Demeanor/Rapport:    Affect (typically observed):    Orientation:     Alcohol / Substance use:  Not Applicable Psych involvement (Current and /or in the community):  No (Comment)  Discharge Needs  Concerns to be addressed:  Care Coordination Readmission within the last 30 days:  No Current discharge risk:  None Barriers to Discharge:  No Barriers Identified   [name], LCSW 05/16/2018, 2:24 PM

## 2018-05-17 DIAGNOSIS — E43 Unspecified severe protein-calorie malnutrition: Secondary | ICD-10-CM

## 2018-05-17 LAB — URINE CULTURE

## 2018-05-17 LAB — BASIC METABOLIC PANEL
Anion gap: 6 (ref 5–15)
BUN: 14 mg/dL (ref 8–23)
CO2: 21 mmol/L — AB (ref 22–32)
CREATININE: 0.73 mg/dL (ref 0.61–1.24)
Calcium: 7.6 mg/dL — ABNORMAL LOW (ref 8.9–10.3)
Chloride: 115 mmol/L — ABNORMAL HIGH (ref 98–111)
GFR calc Af Amer: >60 mL/min (ref >60)
GFR calc non Af Amer: >60 mL/min (ref >60)
GLUCOSE: 89 mg/dL (ref 70–99)
Potassium: 3.8 mmol/L (ref 3.5–5.1)
Sodium: 142 mmol/L (ref 135–145)

## 2018-05-17 LAB — GLUCOSE, CAPILLARY: Glucose-Capillary: 84 mg/dL (ref 70–99)

## 2018-05-17 LAB — PHOSPHORUS: Phosphorus: 2.8 mg/dL (ref 2.5–4.6)

## 2018-05-17 MED ORDER — CEPHALEXIN 500 MG PO CAPS
500.0000 mg | ORAL_CAPSULE | Freq: Two times a day (BID) | ORAL | Status: DC
  Administered 2018-05-17: 500 mg via ORAL
  Filled 2018-05-17: qty 1

## 2018-05-17 MED ORDER — CEFDINIR 300 MG PO CAPS
300.0000 mg | ORAL_CAPSULE | Freq: Two times a day (BID) | ORAL | Status: DC
  Administered 2018-05-17 – 2018-05-18 (×2): 300 mg via ORAL
  Filled 2018-05-17 (×3): qty 1

## 2018-05-17 NOTE — Clinical Social Work Note (Signed)
Patient's girlfriend requested to speak with CSW this morning. CSW met with the girlfriend at patient's bedside. She wanted to discuss the fact that she wanted patient to transfer over the the [facility] hospital for treatment and potential feeding tube because she said "the dietician told me he needs a feeding tube." She kept focusing on patient needing to get to [place], the RN CM, had already spoken with patient's girlfriend about this yesterday and told her that the [place] is on diversion. Patient's girlfriend also brought up the fact that patient's sister would be arriving this afternoon and the sister wanted to assign her as healthcare power of attorney. She then stated that for now, she knew patient could make his own decisions. CSW had noted that patient would answer "yes" to all of patient's girlfriend's questions. CSW asked patient if he knew where he was and he did not respond. I let him know that he was in the hospital. Patient's girlfriend then asked patient what hospital. Patient replied "[place] [facility]." CSW stated that currently patient was confused. CSW informed patient's girlfriend that the responsible party for patient would be his sister and not her because they are not married. CSW informed girlfriend that we would speak with patient's sister to discuss decision making. Patient's girlfriend verbalized understanding. CSW updated patient's physician.  Shela Leff MSW,LCSW [phone]

## 2018-05-17 NOTE — Clinical Social Work Note (Signed)
CSW spoke with patient's sister this afternoon and she admitted that patient's girlfriend is very controlling and that she has felt intimidated by her for many years. Sister has decided at this time that she will not allow her to control the situation with her brother and has spoken with the physician. She is aware patient will discharge tomorrow back to [facility] with hospice. She stated that she does not want patient to transfer to [place] and does not want patient to have a feeding tube. CSW spent much time with patient's sister discussing and allowing her to process with everything she has been dealing with . Patient's sister was very grateful for CSW's visit. [name] MSW,LCSW

## 2018-05-17 NOTE — Consult Note (Signed)
PHARMACY CONSULT NOTE  Pharmacy Consult for Electrolyte Monitoring and Replacement   Indication: Hypokalemia   Labs: Recent Labs    05/14/18 2056 05/15/18 1235 05/16/18 0359 05/17/18 0558  WBC 8.5 9.0  --   --   HGB 8.0* 7.6*  --   --   HCT 24.3* 22.2*  --   --   PLT 289 253  --   --   APTT 38*  --   --   --   CREATININE 0.89 0.71 0.69 0.73  MG 1.6* 2.3 2.1  --   PHOS  --  2.2* 2.2* 2.8  ALBUMIN 2.6*  --   --   --   PROT 6.2*  --   --   --   AST 22  --   --   --   ALT 15  --   --   --   ALKPHOS 64  --   --   --   BILITOT 0.5  --   --   --    Potassium (mmol/L)  Date Value  05/17/2018 3.8   Magnesium (mg/dL)  Date Value  20/35/5974 2.1   Calcium (mg/dL)  Date Value  16/38/4536 7.6 (L)   Albumin (g/dL)  Date Value  46/80/3212 2.6 (L)   Phosphorus (mg/dL)  Date Value  24/82/5003 2.8  ] Estimated Creatinine Clearance: 43.6 mL/min (by C-G formula based on SCr of 0.73 mg/dL).  Assessment: Pharmacy consulted for electrolyte monitoring and replacement in 32 male admitted with sepsis and found to have hypokalemia.   Goal of Therapy:  Electrolyte WNL   Plan:  K 3.8, Phos 2.8 - no supplementation needed at this time.  Will f/u with am labs  Marty Heck, PharmD, BCPS Clinical Pharmacist 05/17/2018 12:06 PM

## 2018-05-17 NOTE — Progress Notes (Signed)
[facility] [place] at [facility]   PATIENT NAME: Alexander Lara    MR#:  [id number]  DATE OF BIRTH:  1945-11-04  SUBJECTIVE:   Pt's sister phyliis in the room. Pt oriented to self and person. Does not know the year. Tells me it is 1970 and keep repeating his name Nim several times Eating little at a time.   REVIEW OF SYSTEMS:   Review of Systems  Unable to perform ROS: Mental acuity   Tolerating Diet:some Tolerating PT: bed bound  DRUG ALLERGIES:  No Known Allergies  VITALS:  Blood pressure (!) 148/80, pulse 90, temperature 98.2 F (36.8 C), temperature source Oral, resp. rate 16, height 5\' 11"  (1.803 m), weight 37.5 kg, SpO2 95 %.  PHYSICAL EXAMINATION:   Physical Exam  GENERAL:  72 y.o.-year-old patient lying in the bed with no acute distress. Thin, cachectic EYES: Pupils equal, round, reactive to light and accommodation. No scleral icterus. Extraocular muscles intact.  HEENT: Head atraumatic, normocephalic. Oropharynx and nasopharynx clear.  NECK:  Supple, no jugular venous distention. No thyroid enlargement, no tenderness.  LUNGS: Normal breath sounds bilaterally, no wheezing, rales, rhonchi. No use of accessory muscles of respiration.  CARDIOVASCULAR: S1, S2 normal. No murmurs, rubs, or gallops.  ABDOMEN: Soft, nontender, nondistended. Bowel sounds present. No organomegaly or mass. Chronic foley+ EXTREMITIES: No cyanosis, clubbing or edema b/l.    NEUROLOGIC: moves all extremities ok PSYCHIATRIC:  patient is alert and oriented x 1  SKIN: No obvious rash, lesion, or ulcer.   LABORATORY PANEL:  CBC Recent Labs  Lab 05/15/18 1235  WBC 9.0  HGB 7.6*  HCT 22.2*  PLT 253    Chemistries  Recent Labs  Lab 05/14/18 2056  05/16/18 0359 05/17/18 0558  NA 145   < > 143 142  K 2.6*   < > 3.8 3.8  CL 108   < > 113* 115*  CO2 25   < > 23 21*  GLUCOSE 136*   < > 95 89  BUN 25*   < > 19 14  CREATININE 0.89   < > 0.69 0.73  CALCIUM 8.2*    < > 7.9* 7.6*  MG 1.6*   < > 2.1  --   AST 22  --   --   --   ALT 15  --   --   --   ALKPHOS 64  --   --   --   BILITOT 0.5  --   --   --    < > = values in this interval not displayed.   Cardiac Enzymes Recent Labs  Lab 05/14/18 2056  TROPONINI 0.03*   RADIOLOGY:  No results found. ASSESSMENT AND PLAN:  Lord Badley  is a 72 y.o. male, NH resident, with a known history of COPD, subdural hematoma, chronic urinary catheter, due to urinary retention. Patient was sent to emergency room from nursing home due to urinary catheter dysfunction/obstruction  noted by nursing home staff in the past few hours.  No reports of bleeding, vomiting or diarrhea.  No recent falls or trauma.  1. Sepsis due to Proteus Mirabilis UTI due to indwelling chronic foley catheter and pneumonia -IV cefepime---po cephalosporins -sats 100% on RA -cont to follow aspiration precautions  2. Severe protein calorie malnutrition -pt seen by dietitian.  -per Hospice who follows pt at [facility] pt is slowly gaining weight -he is currently NOT a candidate for PEG tube placement. This was discussed with his sister  phyllis since she is next of kin. -started on megace also -pt has a working gut and need to cont encourage him to eat.  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 Anemia of chronic disease/ IDA -will cont MVI -hgb stable at 7.6--8.0 -will start po iron polysaccharide - defer further anemia w/u to PCP  5. H/o Subdural hematoma and h/o seizure do Cont keppra  D/w sister at length that I will be discussing any issues with her. Her fiance is legally not married. Sister agrees.  Pt will d/c to [facility] in am if remains stable. Sister acknowledged the plan. CSW notified  Case discussed with Care Management/Social Worker. Management plans discussed with the patient, family and they are in agreement.  CODE STATUS: full  DVT Prophylaxis: sister refuses heparin  TOTAL TIME  TAKING CARE OF THIS PATIENT: *30* minutes.  >50% time spent on counselling and coordination of care  POSSIBLE D/C IN *1* DAYS, DEPENDING ON CLINICAL CONDITION.  Note: This dictation was prepared with Dragon dictation along with smaller phrase technology. Any transcriptional errors that result from this process are unintentional.  Enedina Finner M.D on 05/17/2018 at 5:07 PM  Between 7am to 6pm - Pager - [phone]  After 6pm go to [email or web address] - Social research officer, government  [facility]  Office  [phone]  CC: Primary care physician; [facility] Medical[facility] ID: [facility], male   DOB: Jan 19, 1946, 72 y.o.   MRN: [id number]

## 2018-05-17 NOTE — Progress Notes (Signed)
Visit made. Patient is currently followed by [facility] with a hospice diagnosis of abnormal weight loss. He is a Full code. He was sent to the [facility] ED for evaluation of his foley catheter and was admitted for sepsis and has been receiving IV antibiotics.  Patient seen lying in bed, eyes open, did greet RN, but then closed his eyes and appeared to be sleeping. He did receive a PRN dose of Vicodin prior to writer's visit. Sister [name] at bedside. Writer explained her role and [name] voiced understanding. Per discussion with attending physician Dr. Allena Katz, and chart note review, plan is for  discharge back to [facility] tomorrow. No Peg tube. No transfer to [facility] at this time. He will continue on Megace. Will continue to follow and update hospice team. Dayna Barker RN, BSN, [facility] and [facility] [place], hospital liaison  [phone]

## 2018-05-17 NOTE — Consult Note (Signed)
Pharmacy Antibiotic Note  Alexander Lara is a 72 y.o. male admitted on 05/14/2018 with sepsis secondary to PNA and UTI.  Pharmacy has been consulted for Vancomycin and Cefepime dosing. Patient received Vancomycin 1g IV and Cefepime 2g IV x 2 dose in ED.   Plan: Continue Cefepime 2g IV every 24 hours.   Height: 5\' 11"  (180.3 cm) Weight: 82 lb 10.8 oz (37.5 kg) IBW/kg (Calculated) : 75.3  Temp (24hrs), Avg:98.1 F (36.7 C), Min:97.4 F (36.3 C), Max:98.8 F (37.1 C)  Recent Labs  Lab 05/14/18 2056 05/15/18 0003 05/15/18 1235 05/16/18 0359 05/17/18 0558  WBC 8.5  --  9.0  --   --   CREATININE 0.89  --  0.71 0.69 0.73  LATICACIDVEN 2.3* 1.7  --   --   --     Estimated Creatinine Clearance: 43.6 mL/min (by C-G formula based on SCr of 0.73 mg/dL).    No Known Allergies  Antimicrobials this admission: 8/31  Cefepime >>  8/31 Vancomycin >> 9/2  Microbiology results: 8/31 BCx: NGTD 8/31 UCx: >100k proteus mirabilis 8/31 MRSA PCR: neg  Thank you for allowing pharmacy to be a part of this patient's care.  Marty Heck, PharmD, BCPS Clinical Pharmacist 05/17/2018 12:09 PM

## 2018-05-18 LAB — BASIC METABOLIC PANEL
Anion gap: 6 (ref 5–15)
BUN: 11 mg/dL (ref 8–23)
CALCIUM: 7.7 mg/dL — AB (ref 8.9–10.3)
CO2: 22 mmol/L (ref 22–32)
CREATININE: 0.72 mg/dL (ref 0.61–1.24)
Chloride: 112 mmol/L — ABNORMAL HIGH (ref 98–111)
GFR calc Af Amer: >60 mL/min (ref >60)
GFR calc non Af Amer: >60 mL/min (ref >60)
Glucose, Bld: 80 mg/dL (ref 70–99)
Potassium: 3.6 mmol/L (ref 3.5–5.1)
Sodium: 140 mmol/L (ref 135–145)

## 2018-05-18 LAB — CBC
HCT: 19.9 % — ABNORMAL LOW (ref 40.0–52.0)
Hemoglobin: 6.7 g/dL — ABNORMAL LOW (ref 13.0–18.0)
MCH: 24.7 pg — AB (ref 26.0–34.0)
MCHC: 33.6 g/dL (ref 32.0–36.0)
MCV: 73.6 fL — ABNORMAL LOW (ref 80.0–100.0)
Platelets: 240 10*3/uL (ref 150–440)
RBC: 2.7 MIL/uL — ABNORMAL LOW (ref 4.40–5.90)
RDW: 20.2 % — AB (ref 11.5–14.5)
WBC: 6 10*3/uL (ref 3.8–10.6)

## 2018-05-18 LAB — HEMOGLOBIN AND HEMATOCRIT, BLOOD
HEMATOCRIT: 29.5 % — AB (ref 40.0–52.0)
HEMOGLOBIN: 10 g/dL — AB (ref 13.0–18.0)

## 2018-05-18 LAB — PREPARE RBC (CROSSMATCH)

## 2018-05-18 LAB — ABO/RH: ABO/RH(D): A POS

## 2018-05-18 LAB — GLUCOSE, CAPILLARY
GLUCOSE-CAPILLARY: 82 mg/dL (ref 70–99)
GLUCOSE-CAPILLARY: 130 mg/dL — AB (ref 70–99)
Glucose-Capillary: 91 mg/dL (ref 70–99)

## 2018-05-18 LAB — HEMOGLOBIN: Hemoglobin: 7.8 g/dL — ABNORMAL LOW (ref 13.0–18.0)

## 2018-05-18 LAB — MAGNESIUM: MAGNESIUM: 1.7 mg/dL (ref 1.7–2.4)

## 2018-05-18 LAB — PHOSPHORUS: PHOSPHORUS: 2.2 mg/dL — AB (ref 2.5–4.6)

## 2018-05-18 MED ORDER — SODIUM CHLORIDE 0.9% IV SOLUTION: Freq: Once | INTRAVENOUS | Status: DC

## 2018-05-18 MED ORDER — ASCORBIC ACID 250 MG PO TABS: 250.0000 mg | ORAL_TABLET | Freq: Two times a day (BID) | ORAL | 1 refills | Status: AC

## 2018-05-18 MED ORDER — MAGNESIUM OXIDE 400 (241.3 MG) MG PO TABS
400.0000 mg | ORAL_TABLET | Freq: Every day | ORAL | Status: DC
  Administered 2018-05-18: 400 mg via ORAL
  Filled 2018-05-18: qty 1

## 2018-05-18 MED ORDER — MAGNESIUM SULFATE 2 GM/50ML IV SOLN
2.0000 g | Freq: Once | INTRAVENOUS | Status: AC
  Administered 2018-05-18: 2 g via INTRAVENOUS
  Filled 2018-05-18: qty 50

## 2018-05-18 MED ORDER — NEPRO/CARBSTEADY PO LIQD: 237.0000 mL | Freq: Two times a day (BID) | ORAL | 0 refills | Status: DC

## 2018-05-18 MED ORDER — MAGNESIUM OXIDE 400 (241.3 MG) MG PO TABS: 400.0000 mg | ORAL_TABLET | Freq: Every day | ORAL | 0 refills | Status: AC

## 2018-05-18 MED ORDER — POTASSIUM PHOSPHATES 15 MMOLE/5ML IV SOLN
15.0000 mmol | Freq: Once | INTRAVENOUS | Status: DC
  Administered 2018-05-18: 15 mmol via INTRAVENOUS
  Filled 2018-05-18: qty 5

## 2018-05-18 MED ORDER — SODIUM CHLORIDE 0.9% IV SOLUTION
Freq: Once | INTRAVENOUS | Status: AC
  Administered 2018-05-18: 11:00:00 via INTRAVENOUS

## 2018-05-18 MED ORDER — K PHOS MONO-SOD PHOS DI & MONO 155-852-130 MG PO TABS: 500.0000 mg | ORAL_TABLET | Freq: Every day | ORAL | 0 refills | Status: AC

## 2018-05-18 MED ORDER — ADULT MULTIVITAMIN W/MINERALS CH: 1.0000 | ORAL_TABLET | Freq: Every day | ORAL | 0 refills | Status: DC

## 2018-05-18 MED ORDER — K PHOS MONO-SOD PHOS DI & MONO 155-852-130 MG PO TABS: 500.0000 mg | ORAL_TABLET | Freq: Every day | ORAL | Status: DC

## 2018-05-18 MED ORDER — CEFDINIR 300 MG PO CAPS: 300.0000 mg | ORAL_CAPSULE | Freq: Two times a day (BID) | ORAL | 0 refills | Status: DC

## 2018-05-18 MED ORDER — MEGESTROL ACETATE 400 MG/10ML PO SUSP: 800.0000 mg | Freq: Every day | ORAL | 0 refills | Status: AC

## 2018-05-18 MED ORDER — TAMSULOSIN HCL 0.4 MG PO CAPS: 0.4000 mg | ORAL_CAPSULE | Freq: Every day | ORAL | 0 refills | Status: AC

## 2018-05-18 NOTE — Progress Notes (Signed)
Visit made. Patient seen sitting up in bed, eyes closed, easily awakened to voice. No family present at bedside. Patient given a small sip of Nepro at his request, delayed cough noted. He denied pain, no nonverbal s/s of pain noted. Per chart note review he did take oral medications as scheduled this morning. Per chart review one dose of PRN Vicodin was given overnight. Plan per discussion with attending physician Dr. Allena Katz is for patient to receive one unit of blood for a hemoglobin of 6.7 and electrolytes prior to discharge today. Will continue to follow through discharge and update hospice team. Dayna Barker RN, BSN, [facility] and [facility] [place], hospital Liaison [phone]

## 2018-05-18 NOTE — Progress Notes (Signed)
Gave report to Johnny Bridge at [facility] [phone]

## 2018-05-18 NOTE — Consult Note (Signed)
PHARMACY CONSULT NOTE  Pharmacy Consult for Electrolyte Monitoring and Replacement   Indication: Hypokalemia   Labs: Recent Labs    05/15/18 1235 05/16/18 0359 05/17/18 0558 05/18/18 0449  WBC 9.0  --   --  6.0  HGB 7.6*  --   --  6.7*  HCT 22.2*  --   --  19.9*  PLT 253  --   --  240  CREATININE 0.71 0.69 0.73 0.72  MG 2.3 2.1  --  1.7  PHOS 2.2* 2.2* 2.8 2.2*   Potassium (mmol/L)  Date Value  05/18/2018 3.6   Magnesium (mg/dL)  Date Value  36/14/4315 1.7   Calcium (mg/dL)  Date Value  40/04/6760 7.7 (L)   Albumin (g/dL)  Date Value  95/05/3266 2.6 (L)   Phosphorus (mg/dL)  Date Value  12/45/8099 2.2 (L)  ] Estimated Creatinine Clearance: 43.6 mL/min (by C-G formula based on SCr of 0.72 mg/dL).  Assessment: Pharmacy consulted for electrolyte monitoring and replacement in 79 male admitted with sepsis and found to have hypokalemia.   Pt previously received PO Phos on 9/1 with no change in phos level, then received IV KPhos on 9/2.  Goal of Therapy:  Electrolyte WNL   Plan:  K 3.6, Mag 1.7, Phos 2.2 Will order KPhos 15 mmol IV x1 (low body weight), will provide ~20 mEq K as well Will order Mag sulfate 2 g IV x1 ( lab trending down to borderline) Will f/u with am labs   Marty Heck, PharmD, BCPS Clinical Pharmacist 05/18/2018 7:39 AM

## 2018-05-18 NOTE — Discharge Summary (Addendum)
[facility] - [place] at [facility]   PATIENT NAME: Alexander Lara    MR#:  [id number]  DATE OF BIRTH:  [birth year]  DATE OF ADMISSION:  05/14/2018 ADMITTING PHYSICIAN: Cammy Copa, MD  DATE OF DISCHARGE: 05/18/2018  PRIMARY CARE PHYSICIAN: [facility]    ADMISSION DIAGNOSIS:  Acute cystitis without hematuria [N30.00] HCAP (healthcare-associated pneumonia) [J18.9] Sepsis, due to unspecified organism [facility]) [A41.9] Urinary tract infection associated with indwelling urethral catheter, initial encounter (HCC) [T83.511A, N39.0]  DISCHARGE DIAGNOSIS:  Sepsis due to Acute UTI and Right side pneumonia Protein calorie malnutrition Bed bound Chronic Foley catheter SECONDARY DIAGNOSIS:   Past Medical History:  Diagnosis Date  . Anxiety   . Emphysema of lung (HCC)   . Foley catheter in place   . Seizures (HCC)   . Subdural hematoma (HCC)   . Urinary retention     HOSPITAL COURSE:   RonnieHaleyis a72 y.o.male, NHresident,with a known history of COPD, subdural hematoma,chronic urinary catheter, due to urinary retention. Patient was sent to emergency room from nursing home due to urinary catheter dysfunction/obstruction noted by nursing home staff in the past few hours. No reports of bleeding, vomiting or diarrhea. No recent falls or trauma.  1. Sepsis on admission--resolved now due to Proteus Mirabilis UTI due to indwelling chronic foley catheter and pneumonia -IV cefepime--- change to po cephalosporins -sats 100% on RA -cont to follow aspiration precautions  2. Severe protein calorie malnutrition with electrolyte abnormality -pt seen by dietitian.  -per Hospice who follows pt at [facility] pt is slowly gaining weight -he is currently NOT a candidate for PEG tube placement. This was discussed with his sister phyllis since she is next of kin. -started on megace also -pt has a working gut and need to cont encourage him to eat. 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 Anemia of chronic disease/ IDA -will cont MVI -hgb stable at 6.7 today -will start po iron polysaccharide - defer further anemia w/u to PCP -d/w sister phyllis about giving 1 unit BT and she is ok with it. Risks and benefits discussed with sister  5. H/o Subdural hematoma and h/o seizure do Cont keppra  D/w sister at length that I will be discussing any issues with her about the patient Her fiance is legally not married. Sister agrees. HOSPICE WILL CONTINUE TO FOLLOW AT [facility].  Pt will d/c to [facility] later today if remains stable. Sister acknowledged the plan. CSW notified CONSULTS OBTAINED:    DRUG ALLERGIES:  No Known Allergies  DISCHARGE MEDICATIONS:   Allergies as of 05/18/2018   No Known Allergies     Medication List    TAKE these medications   ascorbic acid 250 MG tablet Commonly known as:  VITAMIN C Take 1 tablet (250 mg total) by mouth 2 (two) times daily.   cefdinir 300 MG capsule Commonly known as:  OMNICEF Take 1 capsule (300 mg total) by mouth every 12 (twelve) hours.   feeding supplement (NEPRO CARB STEADY) Liqd Take 237 mLs by mouth 2 (two) times daily between meals. Start taking on:  05/19/2018   levETIRAcetam 500 MG tablet Commonly known as:  KEPPRA Take 500 mg by mouth 2 (two) times daily.   magnesium oxide 400 (241.3 Mg) MG tablet Commonly known as:  MAG-OX Take 1 tablet (400 mg  total) by mouth daily. Start taking on:  05/19/2018   megestrol 400 MG/10ML suspension Commonly known as:  MEGACE Take 20 mLs (800 mg total) by mouth daily. Start taking on:  05/19/2018   multivitamin with minerals Tabs tablet Take 1 tablet by mouth daily. Start taking on:  05/19/2018   phosphorus 155-852-130 MG tablet Commonly known as:  K PHOS NEUTRAL Take 2 tablets (500 mg total) by mouth  daily. Start taking on:  05/19/2018   tamsulosin 0.4 MG Caps capsule Commonly known as:  FLOMAX Take 1 capsule (0.4 mg total) by mouth daily. Start taking on:  05/19/2018       If you experience worsening of your admission symptoms, develop shortness of breath, life threatening emergency, suicidal or homicidal thoughts you must seek medical attention immediately by calling 911 or calling your MD immediately  if symptoms less severe.  You Must read complete instructions/literature along with all the possible adverse reactions/side effects for all the Medicines you take and that have been prescribed to you. Take any new Medicines after you have completely understood and accept all the possible adverse reactions/side effects.   Please note  You were cared for by a hospitalist during your hospital stay. If you have any questions about your discharge medications or the care you received while you were in the hospital after you are discharged, you can call the unit and asked to speak with the hospitalist on call if the hospitalist that took care of you is not available. Once you are discharged, your primary care physician will handle any further medical issues. Please note that NO REFILLS for any discharge medications will be authorized once you are discharged, as it is imperative that you return to your primary care physician (or establish a relationship with a primary care physician if you do not have one) for your aftercare needs so that they can reassess your need for medications and monitor your lab values.  DATA REVIEW:   CBC  Recent Labs  Lab 05/18/18 0449 05/18/18 0930  WBC 6.0  --   HGB 6.7* 7.8*  HCT 19.9*  --   PLT 240  --     Chemistries  Recent Labs  Lab 05/14/18 2056  05/18/18 0449  NA 145   < > 140  K 2.6*   < > 3.6  CL 108   < > 112*  CO2 25   < > 22  GLUCOSE 136*   < > 80  BUN 25*   < > 11  CREATININE 0.89   < > 0.72  CALCIUM 8.2*   < > 7.7*  MG 1.6*   < > 1.7   AST 22  --   --   ALT 15  --   --   ALKPHOS 64  --   --   BILITOT 0.5  --   --    < > = values in this interval not displayed.    Microbiology Results   Recent Results (from the past 240 hour(s))  Blood Culture (routine x 2)     Status: None (Preliminary result)   Collection Time: 05/14/18  8:56 PM  Result Value Ref Range Status   Specimen Description BLOOD LEFT FOREARM  Final   Special Requests   Final    BOTTLES DRAWN AEROBIC AND ANAEROBIC Blood Culture adequate volume   Culture   Final    NO GROWTH 4 DAYS Performed at [facility], [address], [place], [place] [address]  Report Status PENDING  Incomplete  Blood Culture (routine x 2)     Status: None (Preliminary result)   Collection Time: 05/14/18  9:15 PM  Result Value Ref Range Status   Specimen Description BLOOD RIGHT FOREARM  Final   Special Requests   Final    BOTTLES DRAWN AEROBIC AND ANAEROBIC Blood Culture adequate volume   Culture   Final    NO GROWTH 4 DAYS Performed at [facility], [address], [place], [place] [address]    Report Status PENDING  Incomplete  Urine culture     Status: Abnormal   Collection Time: 05/14/18 10:32 PM  Result Value Ref Range Status   Specimen Description   Final    URINE, RANDOM Performed at [facility], [address], [place], [place] [address]    Special Requests   Final    NONE Performed at [facility], [address], [place], [place] [address]    Culture >=100,000 COLONIES/mL PROTEUS MIRABILIS (A)  Final   Report Status 05/17/2018 FINAL  Final   Organism ID, Bacteria PROTEUS MIRABILIS (A)  Final      Susceptibility   Proteus mirabilis - MIC*    AMPICILLIN <=2 SENSITIVE Sensitive     CEFAZOLIN <=4 SENSITIVE Sensitive     CEFTRIAXONE <=1 SENSITIVE Sensitive     CIPROFLOXACIN <=0.25 SENSITIVE Sensitive     GENTAMICIN <=1 SENSITIVE Sensitive     IMIPENEM 4 SENSITIVE Sensitive     NITROFURANTOIN 128 RESISTANT  Resistant     TRIMETH/SULFA <=20 SENSITIVE Sensitive     AMPICILLIN/SULBACTAM <=2 SENSITIVE Sensitive     PIP/TAZO <=4 SENSITIVE Sensitive     * >=100,000 COLONIES/mL PROTEUS MIRABILIS  MRSA PCR Screening     Status: None   Collection Time: 05/15/18  2:00 AM  Result Value Ref Range Status   MRSA by PCR NEGATIVE NEGATIVE Final    Comment:        The GeneXpert MRSA Assay (FDA approved for NASAL specimens only), is one component of a comprehensive MRSA colonization surveillance program. It is not intended to diagnose MRSA infection nor to guide or monitor treatment for MRSA infections. Performed at [facility], [address], [place], [place] [address]     RADIOLOGY:  No results found.   Management plans discussed with the patient, family and they are in agreement.  CODE STATUS:     Code Status Orders  (From admission, onward)         Start     Ordered   05/15/18 1121  Full code  Continuous     05/15/18 1120        Code Status History    Date Active Date Inactive Code Status Order ID Comments User Context   05/15/2018 0125 05/15/2018 1120 Full Code [id number]  Cammy Copa, MD Inpatient    [place] Directive Documentation     Most Recent Value  Type of [place] Directive  Healthcare Power of Attorney  Pre-existing out of facility DNR order (yellow form or pink MOST form)  -  "MOST" Form in Place?  -      TOTAL TIME TAKING CARE OF THIS PATIENT: *40 minutes.    Enedina Finner M.D on 05/18/2018 at 4:06 PM  Between 7am to 6pm - Pager - [phone] After 6pm go to [email or web address] - Social research officer, government  [facility]  Office  [phone]  CC: Primary care physician; [facility]

## 2018-05-18 NOTE — Progress Notes (Signed)
Patient's sister called to ask how Alexander Lara was doing. Relayed to sister that patient was tolerating blood infusion and would be discharging back to [facility] after infusion was complete and Hgb was stable.

## 2018-05-18 NOTE — Progress Notes (Signed)
Patient will D/C back to [facility] today. Clinical Child psychotherapist (CSW) sent D/C summary to [place]. RN will call report and arrange EMS for transport. Deborah admissions coordinator at [facility] is aware of above.   [employer], LCSW [phone]

## 2018-05-18 NOTE — Progress Notes (Signed)
[facility] - [place] at [facility] [place]   PATIENT NAME: Alexander Lara    MR#:  [id number]  DATE OF BIRTH:  22-Aug-1946  SUBJECTIVE:    Pt oriented to self and person. Does not know the year. Eating little at a time.  Intermittently gets irritable  REVIEW OF SYSTEMS:   Review of Systems  Unable to perform ROS: Mental acuity   Tolerating Diet:some Tolerating PT: bed bound  DRUG ALLERGIES:  No Known Allergies  VITALS:  Blood pressure 136/77, pulse 84, temperature 99.1 F (37.3 C), temperature source Oral, resp. rate 18, height 5\' 11"  (1.803 m), weight 37.5 kg, SpO2 93 %.  PHYSICAL EXAMINATION:   Physical Exam  GENERAL:  72 y.o.-year-old patient lying in the bed with no acute distress. Thin, cachectic EYES: Pupils equal, round, reactive to light and accommodation. No scleral icterus. Extraocular muscles intact.  HEENT: Head atraumatic, normocephalic. Oropharynx and nasopharynx clear.  NECK:  Supple, no jugular venous distention. No thyroid enlargement, no tenderness.  LUNGS: Normal breath sounds bilaterally, no wheezing, rales, rhonchi. No use of accessory muscles of respiration.  CARDIOVASCULAR: S1, S2 normal. No murmurs, rubs, or gallops.  ABDOMEN: Soft, nontender, nondistended. Bowel sounds present. No organomegaly or mass. Chronic foley+ EXTREMITIES: No cyanosis, clubbing or edema b/l.    NEUROLOGIC: moves all extremities ok PSYCHIATRIC:  patient is alert and oriented x 1  SKIN: No obvious rash, lesion, or ulcer.   LABORATORY PANEL:  CBC Recent Labs  Lab 05/18/18 0449  WBC 6.0  HGB 6.7*  HCT 19.9*  PLT 240    Chemistries  Recent Labs  Lab 05/14/18 2056  05/18/18 0449  NA 145   < > 140  K 2.6*   < > 3.6  CL 108   < > 112*  CO2 25   < > 22  GLUCOSE 136*   < > 80  BUN 25*   < > 11  CREATININE 0.89   < > 0.72  CALCIUM 8.2*   < > 7.7*  MG 1.6*   < > 1.7  AST 22  --   --   ALT 15  --   --   ALKPHOS 64  --   --   BILITOT 0.5  --   --     < > = values in this interval not displayed.   Cardiac Enzymes Recent Labs  Lab 05/14/18 2056  TROPONINI 0.03*   RADIOLOGY:  No results found. ASSESSMENT AND PLAN:  Alexander Lara  is a 72 y.o. male, NH resident, with a known history of COPD, subdural hematoma, chronic urinary catheter, due to urinary retention. Patient was sent to emergency room from nursing home due to urinary catheter dysfunction/obstruction  noted by nursing home staff in the past few hours.  No reports of bleeding, vomiting or diarrhea.  No recent falls or trauma.  1. Sepsis on admission--resolved now due to Proteus Mirabilis UTI due to indwelling chronic foley catheter and pneumonia -IV cefepime---po cephalosporins -sats 100% on RA -cont to follow aspiration precautions  2. Severe protein calorie malnutrition with electrolyte abnormality -pt seen by dietitian.  -per Hospice who follows pt at [facility] pt is slowly gaining weight -he is currently NOT a candidate for PEG tube placement. This was discussed with his sister phyllis since she is next of kin. -started on megace also -pt has a working gut and need to cont encourage him to eat. 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 Anemia of chronic disease/ IDA -will cont MVI -hgb stable at 6.7 today -will start po iron polysaccharide - defer further anemia w/u to PCP -d/w sister phyllis about giving 1 unit BT and she is ok with it. Risks and benefits discussed with sister  5. H/o Subdural hematoma and h/o seizure do Cont keppra  D/w sister at length that I will be discussing any issues with her about the patient Her fiance is legally not married. Sister agrees.  Pt will d/c to [facility] later today if remains stable. Sister acknowledged the plan. CSW notified  Case discussed with Care Management/Social Worker. Management  plans discussed with the patient, family and they are in agreement.  CODE STATUS: full  DVT Prophylaxis: sister refuses heparin  TOTAL TIME TAKING CARE OF THIS PATIENT: *30* minutes.  >50% time spent on counselling and coordination of care  POSSIBLE D/C IN *1* DAYS, DEPENDING ON CLINICAL CONDITION.  Note: This dictation was prepared with Dragon dictation along with smaller phrase technology. Any transcriptional errors that result from this process are unintentional.  Enedina Finner M.D on 05/18/2018 at 9:12 AM  Between 7am to 6pm - Pager - [phone]  After 6pm go to [email or web address] - Social research officer, government  [facility] [place] [facility]  Office  [phone]  CC: Primary care physician; [facility] ID: Jeevan Lara, male   DOB: Feb 20, 1946, 72 y.o.   MRN: [id number]

## 2018-05-18 NOTE — Progress Notes (Signed)
Called EMS for transport patient to [facility]

## 2018-05-19 LAB — TYPE AND SCREEN
ABO/RH(D): A POS
Antibody Screen: NEGATIVE
Unit division: 0

## 2018-05-19 LAB — BPAM RBC
Blood Product Expiration Date: 201909282359
ISSUE DATE / TIME: 201909041427
Unit Type and Rh: 6200

## 2018-05-19 LAB — CULTURE, BLOOD (ROUTINE X 2)
CULTURE: NO GROWTH
CULTURE: NO GROWTH
SPECIAL REQUESTS: ADEQUATE
Special Requests: ADEQUATE

## 2018-06-08 ENCOUNTER — Other Ambulatory Visit: Payer: Self-pay

## 2018-06-08 ENCOUNTER — Encounter: Payer: Self-pay | Admitting: *Deleted

## 2018-06-08 ENCOUNTER — Emergency Department: Payer: Medicare Other

## 2018-06-08 ENCOUNTER — Inpatient Hospital Stay
Admission: EM | Admit: 2018-06-08 | Discharge: 2018-07-15 | DRG: 981 | Disposition: E | Payer: Medicare Other | Attending: Internal Medicine | Admitting: Internal Medicine

## 2018-06-08 DIAGNOSIS — M879 Osteonecrosis, unspecified: Secondary | ICD-10-CM | POA: Diagnosis present

## 2018-06-08 DIAGNOSIS — R1110 Vomiting, unspecified: Secondary | ICD-10-CM

## 2018-06-08 DIAGNOSIS — T82868A Thrombosis of vascular prosthetic devices, implants and grafts, initial encounter: Secondary | ICD-10-CM | POA: Diagnosis not present

## 2018-06-08 DIAGNOSIS — Z66 Do not resuscitate: Secondary | ICD-10-CM | POA: Diagnosis present

## 2018-06-08 DIAGNOSIS — N210 Calculus in bladder: Secondary | ICD-10-CM | POA: Diagnosis present

## 2018-06-08 DIAGNOSIS — N390 Urinary tract infection, site not specified: Secondary | ICD-10-CM | POA: Diagnosis present

## 2018-06-08 DIAGNOSIS — J95821 Acute postprocedural respiratory failure: Secondary | ICD-10-CM | POA: Diagnosis not present

## 2018-06-08 DIAGNOSIS — K567 Ileus, unspecified: Secondary | ICD-10-CM

## 2018-06-08 DIAGNOSIS — D509 Iron deficiency anemia, unspecified: Secondary | ICD-10-CM | POA: Diagnosis present

## 2018-06-08 DIAGNOSIS — T83518A Infection and inflammatory reaction due to other urinary catheter, initial encounter: Secondary | ICD-10-CM | POA: Diagnosis not present

## 2018-06-08 DIAGNOSIS — F419 Anxiety disorder, unspecified: Secondary | ICD-10-CM | POA: Diagnosis present

## 2018-06-08 DIAGNOSIS — L89152 Pressure ulcer of sacral region, stage 2: Secondary | ICD-10-CM | POA: Diagnosis present

## 2018-06-08 DIAGNOSIS — Z8701 Personal history of pneumonia (recurrent): Secondary | ICD-10-CM

## 2018-06-08 DIAGNOSIS — B964 Proteus (mirabilis) (morganii) as the cause of diseases classified elsewhere: Secondary | ICD-10-CM | POA: Diagnosis present

## 2018-06-08 DIAGNOSIS — I82B12 Acute embolism and thrombosis of left subclavian vein: Secondary | ICD-10-CM | POA: Diagnosis not present

## 2018-06-08 DIAGNOSIS — Y95 Nosocomial condition: Secondary | ICD-10-CM | POA: Diagnosis present

## 2018-06-08 DIAGNOSIS — D638 Anemia in other chronic diseases classified elsewhere: Secondary | ICD-10-CM | POA: Diagnosis present

## 2018-06-08 DIAGNOSIS — A4159 Other Gram-negative sepsis: Secondary | ICD-10-CM | POA: Diagnosis present

## 2018-06-08 DIAGNOSIS — L8990 Pressure ulcer of unspecified site, unspecified stage: Secondary | ICD-10-CM

## 2018-06-08 DIAGNOSIS — T380X5A Adverse effect of glucocorticoids and synthetic analogues, initial encounter: Secondary | ICD-10-CM | POA: Diagnosis present

## 2018-06-08 DIAGNOSIS — I469 Cardiac arrest, cause unspecified: Secondary | ICD-10-CM | POA: Diagnosis not present

## 2018-06-08 DIAGNOSIS — I480 Paroxysmal atrial fibrillation: Secondary | ICD-10-CM | POA: Diagnosis present

## 2018-06-08 DIAGNOSIS — R05 Cough: Secondary | ICD-10-CM

## 2018-06-08 DIAGNOSIS — Z87440 Personal history of urinary (tract) infections: Secondary | ICD-10-CM

## 2018-06-08 DIAGNOSIS — J439 Emphysema, unspecified: Secondary | ICD-10-CM | POA: Diagnosis present

## 2018-06-08 DIAGNOSIS — Z01818 Encounter for other preprocedural examination: Secondary | ICD-10-CM

## 2018-06-08 DIAGNOSIS — Z87891 Personal history of nicotine dependence: Secondary | ICD-10-CM

## 2018-06-08 DIAGNOSIS — R4702 Dysphasia: Secondary | ICD-10-CM | POA: Diagnosis present

## 2018-06-08 DIAGNOSIS — J690 Pneumonitis due to inhalation of food and vomit: Secondary | ICD-10-CM | POA: Diagnosis present

## 2018-06-08 DIAGNOSIS — S3633XA Laceration of stomach, initial encounter: Secondary | ICD-10-CM | POA: Diagnosis present

## 2018-06-08 DIAGNOSIS — Z880 Allergy status to penicillin: Secondary | ICD-10-CM

## 2018-06-08 DIAGNOSIS — Z515 Encounter for palliative care: Secondary | ICD-10-CM

## 2018-06-08 DIAGNOSIS — K631 Perforation of intestine (nontraumatic): Secondary | ICD-10-CM

## 2018-06-08 DIAGNOSIS — Z681 Body mass index (BMI) 19 or less, adult: Secondary | ICD-10-CM

## 2018-06-08 DIAGNOSIS — Z978 Presence of other specified devices: Secondary | ICD-10-CM

## 2018-06-08 DIAGNOSIS — E876 Hypokalemia: Secondary | ICD-10-CM | POA: Diagnosis present

## 2018-06-08 DIAGNOSIS — Z9911 Dependence on respirator [ventilator] status: Secondary | ICD-10-CM

## 2018-06-08 DIAGNOSIS — R627 Adult failure to thrive: Secondary | ICD-10-CM

## 2018-06-08 DIAGNOSIS — K9429 Other complications of gastrostomy: Secondary | ICD-10-CM | POA: Diagnosis not present

## 2018-06-08 DIAGNOSIS — K653 Choleperitonitis: Secondary | ICD-10-CM | POA: Diagnosis not present

## 2018-06-08 DIAGNOSIS — R6520 Severe sepsis without septic shock: Secondary | ICD-10-CM | POA: Diagnosis present

## 2018-06-08 DIAGNOSIS — E871 Hypo-osmolality and hyponatremia: Secondary | ICD-10-CM | POA: Diagnosis present

## 2018-06-08 DIAGNOSIS — N400 Enlarged prostate without lower urinary tract symptoms: Secondary | ICD-10-CM | POA: Diagnosis present

## 2018-06-08 DIAGNOSIS — R0902 Hypoxemia: Secondary | ICD-10-CM

## 2018-06-08 DIAGNOSIS — J9690 Respiratory failure, unspecified, unspecified whether with hypoxia or hypercapnia: Secondary | ICD-10-CM

## 2018-06-08 DIAGNOSIS — E872 Acidosis: Secondary | ICD-10-CM | POA: Diagnosis not present

## 2018-06-08 DIAGNOSIS — Z09 Encounter for follow-up examination after completed treatment for conditions other than malignant neoplasm: Secondary | ICD-10-CM

## 2018-06-08 DIAGNOSIS — N3289 Other specified disorders of bladder: Secondary | ICD-10-CM | POA: Diagnosis present

## 2018-06-08 DIAGNOSIS — Y846 Urinary catheterization as the cause of abnormal reaction of the patient, or of later complication, without mention of misadventure at the time of the procedure: Secondary | ICD-10-CM | POA: Diagnosis present

## 2018-06-08 DIAGNOSIS — J9600 Acute respiratory failure, unspecified whether with hypoxia or hypercapnia: Secondary | ICD-10-CM

## 2018-06-08 DIAGNOSIS — Z0189 Encounter for other specified special examinations: Secondary | ICD-10-CM

## 2018-06-08 DIAGNOSIS — S065X9A Traumatic subdural hemorrhage with loss of consciousness of unspecified duration, initial encounter: Secondary | ICD-10-CM | POA: Diagnosis present

## 2018-06-08 DIAGNOSIS — K668 Other specified disorders of peritoneum: Secondary | ICD-10-CM

## 2018-06-08 DIAGNOSIS — R059 Cough, unspecified: Secondary | ICD-10-CM

## 2018-06-08 DIAGNOSIS — E43 Unspecified severe protein-calorie malnutrition: Secondary | ICD-10-CM | POA: Diagnosis present

## 2018-06-08 DIAGNOSIS — K255 Chronic or unspecified gastric ulcer with perforation: Secondary | ICD-10-CM | POA: Diagnosis present

## 2018-06-08 DIAGNOSIS — K9171 Accidental puncture and laceration of a digestive system organ or structure during a digestive system procedure: Secondary | ICD-10-CM | POA: Diagnosis not present

## 2018-06-08 DIAGNOSIS — R109 Unspecified abdominal pain: Secondary | ICD-10-CM

## 2018-06-08 DIAGNOSIS — Z7401 Bed confinement status: Secondary | ICD-10-CM

## 2018-06-08 DIAGNOSIS — R600 Localized edema: Secondary | ICD-10-CM

## 2018-06-08 DIAGNOSIS — J479 Bronchiectasis, uncomplicated: Secondary | ICD-10-CM | POA: Diagnosis present

## 2018-06-08 DIAGNOSIS — N136 Pyonephrosis: Secondary | ICD-10-CM | POA: Diagnosis present

## 2018-06-08 DIAGNOSIS — R64 Cachexia: Secondary | ICD-10-CM | POA: Diagnosis present

## 2018-06-08 DIAGNOSIS — E878 Other disorders of electrolyte and fluid balance, not elsewhere classified: Secondary | ICD-10-CM | POA: Diagnosis present

## 2018-06-08 DIAGNOSIS — R0602 Shortness of breath: Secondary | ICD-10-CM

## 2018-06-08 DIAGNOSIS — E860 Dehydration: Secondary | ICD-10-CM | POA: Diagnosis present

## 2018-06-08 DIAGNOSIS — R609 Edema, unspecified: Secondary | ICD-10-CM

## 2018-06-08 DIAGNOSIS — Z79899 Other long term (current) drug therapy: Secondary | ICD-10-CM

## 2018-06-08 DIAGNOSIS — Y848 Other medical procedures as the cause of abnormal reaction of the patient, or of later complication, without mention of misadventure at the time of the procedure: Secondary | ICD-10-CM | POA: Diagnosis not present

## 2018-06-08 DIAGNOSIS — Y92238 Other place in hospital as the place of occurrence of the external cause: Secondary | ICD-10-CM | POA: Diagnosis not present

## 2018-06-08 DIAGNOSIS — Z452 Encounter for adjustment and management of vascular access device: Secondary | ICD-10-CM

## 2018-06-08 DIAGNOSIS — G40909 Epilepsy, unspecified, not intractable, without status epilepticus: Secondary | ICD-10-CM | POA: Diagnosis present

## 2018-06-08 LAB — CBC WITH DIFFERENTIAL/PLATELET
Basophils Absolute: 0 10*3/uL (ref 0–0.1)
Basophils Relative: 0 %
EOS ABS: 0 10*3/uL (ref 0–0.7)
EOS PCT: 0 %
HCT: 28.9 % — ABNORMAL LOW (ref 40.0–52.0)
Hemoglobin: 9.9 g/dL — ABNORMAL LOW (ref 13.0–18.0)
Lymphocytes Relative: 11 %
Lymphs Abs: 1.3 10*3/uL (ref 1.0–3.6)
MCH: 26.5 pg (ref 26.0–34.0)
MCHC: 34.2 g/dL (ref 32.0–36.0)
MCV: 77.7 fL — ABNORMAL LOW (ref 80.0–100.0)
MONO ABS: 0.5 10*3/uL (ref 0.2–1.0)
MONOS PCT: 5 %
Neutro Abs: 9.7 10*3/uL — ABNORMAL HIGH (ref 1.4–6.5)
Neutrophils Relative %: 84 %
PLATELETS: 316 10*3/uL (ref 150–440)
RBC: 3.72 MIL/uL — ABNORMAL LOW (ref 4.40–5.90)
RDW: 25.8 % — AB (ref 11.5–14.5)
WBC: 11.5 10*3/uL — ABNORMAL HIGH (ref 3.8–10.6)

## 2018-06-08 LAB — URINALYSIS, COMPLETE (UACMP) WITH MICROSCOPIC
BILIRUBIN URINE: NEGATIVE
GLUCOSE, UA: NEGATIVE mg/dL
HGB URINE DIPSTICK: NEGATIVE
Ketones, ur: NEGATIVE mg/dL
NITRITE: POSITIVE — AB
PH: 8 (ref 5.0–8.0)
Protein, ur: >=300 mg/dL — AB
Specific Gravity, Urine: 1.02 (ref 1.005–1.030)
Squamous Epithelial / HPF: NONE SEEN (ref 0–5)
WBC, UA: >50 WBC/hpf — ABNORMAL HIGH (ref 0–5)

## 2018-06-08 LAB — BASIC METABOLIC PANEL
ANION GAP: 10 (ref 5–15)
BUN: 38 mg/dL — ABNORMAL HIGH (ref 8–23)
CALCIUM: 8.5 mg/dL — AB (ref 8.9–10.3)
CHLORIDE: 112 mmol/L — AB (ref 98–111)
CO2: 22 mmol/L (ref 22–32)
Creatinine, Ser: 1 mg/dL (ref 0.61–1.24)
GFR calc non Af Amer: >60 mL/min (ref >60)
GLUCOSE: 108 mg/dL — AB (ref 70–99)
POTASSIUM: 3.8 mmol/L (ref 3.5–5.1)
Sodium: 144 mmol/L (ref 135–145)

## 2018-06-08 MED ORDER — SODIUM CHLORIDE 0.9 % IV SOLN
1.0000 g | Freq: Once | INTRAVENOUS | Status: AC
  Administered 2018-06-09: 1 g via INTRAVENOUS
  Filled 2018-06-08: qty 10

## 2018-06-08 NOTE — ED Provider Notes (Signed)
[facility] Emergency Department Provider Note   ____________________________________________   I have reviewed the triage vital signs and the nursing notes.   HISTORY  Chief Complaint Urinary Retention   History limited by and level 5 caveat due to: patients inability to give history   HPI Alexander Lara is a 72 y.o. male who presents to the emergency department today because of family concern for change in urination. [facility] coming from [facility]. The patient himself cannot give any history. He does have a history of acute cystitis.     Per medical record review patient has a history of seizures, subdural hematoma.   Past Medical History:  Diagnosis Date  . Anxiety   . Emphysema of lung (HCC)   . Foley catheter in place   . Seizures (HCC)   . Subdural hematoma (HCC)   . Urinary retention     Patient Active Problem List   Diagnosis Date Noted  . Protein-calorie malnutrition, severe 05/17/2018  . Sepsis (HCC) 05/14/2018    History reviewed. No pertinent surgical history.  Prior to Admission medications   Medication Sig Start Date End Date Taking? Authorizing Provider  cefdinir (OMNICEF) 300 MG capsule Take 1 capsule (300 mg total) by mouth every 12 (twelve) hours. 05/18/18   Enedina Finner, MD  levETIRAcetam (KEPPRA) 500 MG tablet Take 500 mg by mouth 2 (two) times daily.    [provider]  magnesium oxide (MAG-OX) 400 (241.3 Mg) MG tablet Take 1 tablet (400 mg total) by mouth daily. 05/19/18   Enedina Finner, MD  megestrol (MEGACE) 400 MG/10ML suspension Take 20 mLs (800 mg total) by mouth daily. 05/19/18   Enedina Finner, MD  Multiple Vitamin (MULTIVITAMIN WITH MINERALS) TABS tablet Take 1 tablet by mouth daily. 05/19/18   Enedina Finner, MD  Nutritional Supplements (FEEDING SUPPLEMENT, NEPRO CARB STEADY,) LIQD Take 237 mLs by mouth 2 (two) times daily between meals. 05/19/18   Enedina Finner, MD  phosphorus (K PHOS NEUTRAL) [id number] MG tablet  Take 2 tablets (500 mg total) by mouth daily. 05/19/18   Enedina Finner, MD  tamsulosin (FLOMAX) 0.4 MG CAPS capsule Take 1 capsule (0.4 mg total) by mouth daily. 05/19/18   Enedina Finner, MD  vitamin C (VITAMIN C) 250 MG tablet Take 1 tablet (250 mg total) by mouth 2 (two) times daily. 05/18/18   Enedina Finner, MD    Allergies Patient has no known allergies.  No family history on file.  Social History Social History   Tobacco Use  . Smoking status: Former Games developer  . Smokeless tobacco: Never Used  Substance Use Topics  . Alcohol use: No    Frequency: Never  . Drug use: No    Review of Systems Constitutional: No fever/chills Eyes: No visual changes. ENT: No sore throat. Cardiovascular: Denies chest pain. Respiratory: Denies shortness of breath. Gastrointestinal: No abdominal pain.  No nausea, no vomiting.  No diarrhea.   Genitourinary: Negative for dysuria. Musculoskeletal: Negative for back pain. Skin: Negative for rash. Neurological: Negative for headaches, focal weakness or numbness.  ____________________________________________   PHYSICAL EXAM:  VITAL SIGNS: ED Triage Vitals  Enc Vitals Group     BP 05/27/2018 1942 (!) 128/96     Pulse Rate 05/25/2018 1942 92     Resp 06/04/2018 1942 16     Temp 05/25/2018 1942 98.6 F (37 C)     Temp Source 06/04/2018 1942 Oral     SpO2 06/13/2018 1942 97 %  Weight 06/07/2018 1948 82 lb 10.8 oz (37.5 kg)     Height 05/25/2018 1948 5\' 11"  (1.803 m)     Head Circumference --      Peak Flow --      Pain Score 06/03/2018 1948 0   Constitutional: Awake and alert.  Eyes: Conjunctivae are normal.  ENT      Head: Normocephalic and atraumatic.      Nose: No congestion/rhinnorhea.      Mouth/Throat: Mucous membranes are moist.      Neck: No stridor. Hematological/Lymphatic/Immunilogical: No cervical lymphadenopathy. Cardiovascular: Normal rate, regular rhythm.  No murmurs, rubs, or gallops.  Respiratory: Normal respiratory effort without tachypnea nor  retractions. Breath sounds are clear and equal bilaterally. No wheezes/rales/rhonchi. Gastrointestinal: Soft and non tender. No rebound. No guarding.  Genitourinary: Deferred Musculoskeletal: Atrophic extremities. Neurologic:  Sequelae of brain trauma. No gross focal neurologic deficits are appreciated.  Skin:  Skin is warm, dry and intact. No rash noted. ____________________________________________    LABS (pertinent positives/negatives)  BMP na 144, k 3.8, glu 108, cr 1.00 CBC wbc 11.5, hgb 9.9, plt 316 UA turbid, >300, nitrite positive, leukocytes large ____________________________________________   EKG  None  ____________________________________________    RADIOLOGY  CXR Clearing of recent pneumonia  ____________________________________________   PROCEDURES  Procedures  ____________________________________________   INITIAL IMPRESSION / ASSESSMENT AND PLAN / ED COURSE  Pertinent labs & imaging results that were available during my care of the patient were reviewed by me and considered in my medical decision making (see chart for details).   Presented to the emergency department today because of concerns for recurrent urinary tract infection.  Urine is concerning for urinary tract infection.  It sounds like patient is having some failure to thrive per family.  Will plan on IV antibiotics and admission.  Discussed with hospitalist service for admission.  ____________________________________________   FINAL CLINICAL IMPRESSION(S) / ED DIAGNOSES  Final diagnoses:  Lower urinary tract infectious disease     Note: This dictation was prepared with Dragon dictation. Any transcriptional errors that result from this process are unintentional     [name], MD 06/10/2018 2344

## 2018-06-08 NOTE — ED Triage Notes (Signed)
Per EMS pt had dark cloudy urine and family wanted him seen. He is a resident at [facility]

## 2018-06-08 NOTE — ED Notes (Signed)
Family came out to desk and states pts face and head got really red and it scared them. Blood pressure is elevated and pt states his head hurts. Dr Derrill Kay notified.

## 2018-06-09 ENCOUNTER — Other Ambulatory Visit: Payer: Self-pay

## 2018-06-09 ENCOUNTER — Encounter: Payer: Self-pay | Admitting: Internal Medicine

## 2018-06-09 DIAGNOSIS — E43 Unspecified severe protein-calorie malnutrition: Secondary | ICD-10-CM | POA: Diagnosis present

## 2018-06-09 DIAGNOSIS — M879 Osteonecrosis, unspecified: Secondary | ICD-10-CM | POA: Diagnosis present

## 2018-06-09 DIAGNOSIS — J95821 Acute postprocedural respiratory failure: Secondary | ICD-10-CM | POA: Diagnosis not present

## 2018-06-09 DIAGNOSIS — K567 Ileus, unspecified: Secondary | ICD-10-CM | POA: Diagnosis present

## 2018-06-09 DIAGNOSIS — Y846 Urinary catheterization as the cause of abnormal reaction of the patient, or of later complication, without mention of misadventure at the time of the procedure: Secondary | ICD-10-CM | POA: Diagnosis present

## 2018-06-09 DIAGNOSIS — K255 Chronic or unspecified gastric ulcer with perforation: Secondary | ICD-10-CM | POA: Diagnosis present

## 2018-06-09 DIAGNOSIS — D638 Anemia in other chronic diseases classified elsewhere: Secondary | ICD-10-CM | POA: Diagnosis present

## 2018-06-09 DIAGNOSIS — E871 Hypo-osmolality and hyponatremia: Secondary | ICD-10-CM | POA: Diagnosis present

## 2018-06-09 DIAGNOSIS — R64 Cachexia: Secondary | ICD-10-CM | POA: Diagnosis present

## 2018-06-09 DIAGNOSIS — Z515 Encounter for palliative care: Secondary | ICD-10-CM

## 2018-06-09 DIAGNOSIS — T83518A Infection and inflammatory reaction due to other urinary catheter, initial encounter: Secondary | ICD-10-CM | POA: Diagnosis present

## 2018-06-09 DIAGNOSIS — K653 Choleperitonitis: Secondary | ICD-10-CM | POA: Diagnosis not present

## 2018-06-09 DIAGNOSIS — Y92238 Other place in hospital as the place of occurrence of the external cause: Secondary | ICD-10-CM | POA: Diagnosis not present

## 2018-06-09 DIAGNOSIS — E872 Acidosis: Secondary | ICD-10-CM | POA: Diagnosis not present

## 2018-06-09 DIAGNOSIS — B964 Proteus (mirabilis) (morganii) as the cause of diseases classified elsewhere: Secondary | ICD-10-CM | POA: Diagnosis present

## 2018-06-09 DIAGNOSIS — S065X9A Traumatic subdural hemorrhage with loss of consciousness of unspecified duration, initial encounter: Secondary | ICD-10-CM | POA: Diagnosis present

## 2018-06-09 DIAGNOSIS — Y848 Other medical procedures as the cause of abnormal reaction of the patient, or of later complication, without mention of misadventure at the time of the procedure: Secondary | ICD-10-CM | POA: Diagnosis not present

## 2018-06-09 DIAGNOSIS — N136 Pyonephrosis: Secondary | ICD-10-CM | POA: Diagnosis present

## 2018-06-09 DIAGNOSIS — R6520 Severe sepsis without septic shock: Secondary | ICD-10-CM | POA: Diagnosis present

## 2018-06-09 DIAGNOSIS — D509 Iron deficiency anemia, unspecified: Secondary | ICD-10-CM | POA: Diagnosis present

## 2018-06-09 DIAGNOSIS — R627 Adult failure to thrive: Secondary | ICD-10-CM

## 2018-06-09 DIAGNOSIS — Z681 Body mass index (BMI) 19 or less, adult: Secondary | ICD-10-CM | POA: Diagnosis not present

## 2018-06-09 DIAGNOSIS — J690 Pneumonitis due to inhalation of food and vomit: Secondary | ICD-10-CM | POA: Diagnosis present

## 2018-06-09 DIAGNOSIS — N390 Urinary tract infection, site not specified: Secondary | ICD-10-CM

## 2018-06-09 DIAGNOSIS — A4159 Other Gram-negative sepsis: Secondary | ICD-10-CM | POA: Diagnosis present

## 2018-06-09 DIAGNOSIS — S3633XA Laceration of stomach, initial encounter: Secondary | ICD-10-CM | POA: Diagnosis present

## 2018-06-09 DIAGNOSIS — T82868A Thrombosis of vascular prosthetic devices, implants and grafts, initial encounter: Secondary | ICD-10-CM | POA: Diagnosis not present

## 2018-06-09 DIAGNOSIS — I82B12 Acute embolism and thrombosis of left subclavian vein: Secondary | ICD-10-CM | POA: Diagnosis not present

## 2018-06-09 DIAGNOSIS — K9171 Accidental puncture and laceration of a digestive system organ or structure during a digestive system procedure: Secondary | ICD-10-CM | POA: Diagnosis not present

## 2018-06-09 DIAGNOSIS — Y95 Nosocomial condition: Secondary | ICD-10-CM | POA: Diagnosis present

## 2018-06-09 LAB — BLOOD CULTURE ID PANEL (REFLEXED)
ACINETOBACTER BAUMANNII: NOT DETECTED
CANDIDA ALBICANS: NOT DETECTED
CANDIDA KRUSEI: NOT DETECTED
CANDIDA PARAPSILOSIS: NOT DETECTED
Candida glabrata: NOT DETECTED
Candida tropicalis: NOT DETECTED
ENTEROCOCCUS SPECIES: NOT DETECTED
Enterobacter cloacae complex: NOT DETECTED
Enterobacteriaceae species: NOT DETECTED
Escherichia coli: NOT DETECTED
Haemophilus influenzae: NOT DETECTED
KLEBSIELLA OXYTOCA: NOT DETECTED
KLEBSIELLA PNEUMONIAE: NOT DETECTED
LISTERIA MONOCYTOGENES: NOT DETECTED
Neisseria meningitidis: NOT DETECTED
Proteus species: NOT DETECTED
Pseudomonas aeruginosa: NOT DETECTED
SERRATIA MARCESCENS: NOT DETECTED
STAPHYLOCOCCUS AUREUS BCID: NOT DETECTED
STREPTOCOCCUS PYOGENES: NOT DETECTED
Staphylococcus species: NOT DETECTED
Streptococcus agalactiae: NOT DETECTED
Streptococcus pneumoniae: NOT DETECTED
Streptococcus species: NOT DETECTED

## 2018-06-09 LAB — HEPATIC FUNCTION PANEL
ALK PHOS: 51 U/L (ref 38–126)
ALT: 11 U/L (ref 0–44)
AST: 14 U/L — AB (ref 15–41)
Albumin: 2.7 g/dL — ABNORMAL LOW (ref 3.5–5.0)
BILIRUBIN DIRECT: 0.2 mg/dL (ref 0.0–0.2)
BILIRUBIN INDIRECT: 0.4 mg/dL (ref 0.3–0.9)
BILIRUBIN TOTAL: 0.6 mg/dL (ref 0.3–1.2)
Total Protein: 6.5 g/dL (ref 6.5–8.1)

## 2018-06-09 LAB — PHOSPHORUS: Phosphorus: 3.9 mg/dL (ref 2.5–4.6)

## 2018-06-09 LAB — MAGNESIUM
Magnesium: 1.9 mg/dL (ref 1.7–2.4)
Magnesium: 1.8 mg/dL (ref 1.7–2.4)

## 2018-06-09 LAB — POTASSIUM: POTASSIUM: 3.9 mmol/L (ref 3.5–5.1)

## 2018-06-09 LAB — PREALBUMIN: Prealbumin: 21.6 mg/dL (ref 18–38)

## 2018-06-09 MED ORDER — LACTATED RINGERS IV SOLN
INTRAVENOUS | Status: AC
  Administered 2018-06-09: 02:00:00 via INTRAVENOUS

## 2018-06-09 MED ORDER — BISACODYL 5 MG PO TBEC
5.0000 mg | DELAYED_RELEASE_TABLET | Freq: Every day | ORAL | Status: DC | PRN
  Administered 2018-06-10 – 2018-06-13 (×3): 5 mg via ORAL
  Filled 2018-06-09 (×4): qty 1

## 2018-06-09 MED ORDER — NEPRO/CARBSTEADY PO LIQD
237.0000 mL | Freq: Two times a day (BID) | ORAL | Status: DC
  Administered 2018-06-09 (×2): 237 mL via ORAL

## 2018-06-09 MED ORDER — ALBUTEROL SULFATE (2.5 MG/3ML) 0.083% IN NEBU
2.5000 mg | INHALATION_SOLUTION | RESPIRATORY_TRACT | Status: DC | PRN
  Administered 2018-06-09 – 2018-06-24 (×6): 2.5 mg via RESPIRATORY_TRACT
  Filled 2018-06-09 (×7): qty 3

## 2018-06-09 MED ORDER — NEPRO/CARBSTEADY PO LIQD
237.0000 mL | Freq: Three times a day (TID) | ORAL | Status: DC
  Administered 2018-06-09 – 2018-06-15 (×10): 237 mL via ORAL

## 2018-06-09 MED ORDER — TAMSULOSIN HCL 0.4 MG PO CAPS
0.4000 mg | ORAL_CAPSULE | Freq: Every day | ORAL | Status: DC
  Administered 2018-06-09 – 2018-06-10 (×2): 0.4 mg via ORAL
  Filled 2018-06-09 (×2): qty 1

## 2018-06-09 MED ORDER — LEVETIRACETAM 500 MG PO TABS
500.0000 mg | ORAL_TABLET | Freq: Two times a day (BID) | ORAL | Status: DC
  Filled 2018-06-09: qty 1

## 2018-06-09 MED ORDER — ENOXAPARIN SODIUM 40 MG/0.4ML ~~LOC~~ SOLN
30.0000 mg | SUBCUTANEOUS | Status: DC
  Filled 2018-06-09 (×2): qty 0.4

## 2018-06-09 MED ORDER — MEGESTROL ACETATE 400 MG/10ML PO SUSP
800.0000 mg | Freq: Every day | ORAL | Status: DC
  Administered 2018-06-09 – 2018-06-10 (×2): 800 mg via ORAL
  Filled 2018-06-09 (×3): qty 20

## 2018-06-09 MED ORDER — SODIUM CHLORIDE 0.9 % IV SOLN
1.0000 g | INTRAVENOUS | Status: DC
  Administered 2018-06-09: 1 g via INTRAVENOUS
  Filled 2018-06-09: qty 1
  Filled 2018-06-09: qty 10

## 2018-06-09 MED ORDER — ONDANSETRON HCL 4 MG/2ML IJ SOLN
4.0000 mg | Freq: Four times a day (QID) | INTRAMUSCULAR | Status: DC | PRN
  Administered 2018-06-26 – 2018-06-29 (×2): 4 mg via INTRAVENOUS
  Filled 2018-06-09 (×3): qty 2

## 2018-06-09 MED ORDER — ACETAMINOPHEN 650 MG RE SUPP: 650.0000 mg | Freq: Four times a day (QID) | RECTAL | Status: DC | PRN

## 2018-06-09 MED ORDER — LEVETIRACETAM 100 MG/ML PO SOLN
500.0000 mg | Freq: Two times a day (BID) | ORAL | Status: DC
  Administered 2018-06-09 – 2018-06-15 (×15): 500 mg via ORAL
  Filled 2018-06-09 (×18): qty 5

## 2018-06-09 MED ORDER — ACETAMINOPHEN 325 MG PO TABS
650.0000 mg | ORAL_TABLET | Freq: Four times a day (QID) | ORAL | Status: DC | PRN
  Administered 2018-06-09 – 2018-06-14 (×5): 650 mg via ORAL
  Filled 2018-06-09 (×8): qty 2

## 2018-06-09 MED ORDER — LEVETIRACETAM 100 MG/ML PO SOLN
500.0000 mg | Freq: Two times a day (BID) | ORAL | Status: DC
  Filled 2018-06-09 (×3): qty 5

## 2018-06-09 MED ORDER — ADULT MULTIVITAMIN W/MINERALS CH
1.0000 | ORAL_TABLET | Freq: Every day | ORAL | Status: DC
  Administered 2018-06-09 – 2018-06-10 (×2): 1 via ORAL
  Filled 2018-06-09 (×2): qty 1

## 2018-06-09 MED ORDER — SENNOSIDES-DOCUSATE SODIUM 8.6-50 MG PO TABS
1.0000 | ORAL_TABLET | Freq: Every evening | ORAL | Status: DC | PRN
  Administered 2018-06-10: 1 via ORAL
  Filled 2018-06-09: qty 1

## 2018-06-09 MED ORDER — ONDANSETRON HCL 4 MG PO TABS: 4.0000 mg | ORAL_TABLET | Freq: Four times a day (QID) | ORAL | Status: DC | PRN

## 2018-06-09 MED ORDER — MELATONIN 5 MG PO TABS
5.0000 mg | ORAL_TABLET | Freq: Every day | ORAL | Status: DC
  Administered 2018-06-09 – 2018-06-14 (×6): 5 mg via ORAL
  Filled 2018-06-09 (×9): qty 1

## 2018-06-09 MED ORDER — SODIUM CHLORIDE 0.9% FLUSH
3.0000 mL | Freq: Two times a day (BID) | INTRAVENOUS | Status: DC
  Administered 2018-06-10 – 2018-06-18 (×8): 3 mL via INTRAVENOUS

## 2018-06-09 NOTE — Progress Notes (Signed)
Talked to Dr. Cherlynn Kaiser about patient's hcpoa (sister) requesting for his DNR status to be change to Full code. Dr. Cherlynn Kaiser talked to patient's sister and explained his status and will keep his DNR status for now. RN will continue to monitor.

## 2018-06-09 NOTE — Progress Notes (Signed)
Patient was transferred to the unit following diagnosis of UTI. Although patient was unable to provide much information about what is going on, he was A&O X4 ,had a chronic foley  catheter in place. Most admission documentation was provided by the patient fiancee and sisters. malodorous odor of urine was dumped from patient's foley. Per family patient was suppose to be on a puree diet and nectar thick liquid. Patient was proved some nectar thick liquid and he tolerated it well. Patient was repositioned Q2.

## 2018-06-09 NOTE — Progress Notes (Signed)
Talked to Dr. Cherlynn Kaiser about patient's HR went to 170, also has wide QRS was non sustaining, was in AF RVR, other VSS, BP was 133/75. Patient's HR now was fluctuating to 120's-110's unable to assess patient if his in pain, only answer yes or now question. Orders to check magnesium and potassium. RN will continue to monitor.

## 2018-06-09 NOTE — Progress Notes (Signed)
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  GOAL:   Patient will meet greater than or equal to 90% of their needs  MONITOR:   PO intake, Weight trends, Labs, Supplement acceptance  REASON FOR ASSESSMENT:   Malnutrition Screening Tool    ASSESSMENT:   Pt is resident of [facility] admitted 9/25 for urinary retention w/ foley catheter in place and failure to thrive. Previous admit 8/31PMH: emphysema, seizures, subdural heamtoma   Pt awake at time of visit w/ lunch at bedside. Pt is non-verbal but was able to respond to a few questions by nodding. Pt appears very malnourished and when I asked if pt was hungry he shook his head no, pt did shake head yes to liking the Nepro that sat on his lunch tray. Per MD note, pt is not a candidate for PEG placement and pt taking appetite stimulant to assist with PO intake. Wt continues to trend down since last admission.  Palliative Care note reports pts partner is considering alternate SNF placement or taking pt home with out of pocket caregivers.   Medications Reviewed: Megace, MVI, Nepro Labs Reviewed: Glucose 108, Cr 38 (H), Ca for low albumin corrects 8.8 (L), Mag 1.9 (L). Phos 3.9(L)  NUTRITION - FOCUSED PHYSICAL EXAM:    Most Recent Value  Orbital Region  Severe depletion  Upper Arm Region  Severe depletion  Thoracic and Lumbar Region  Severe depletion  Buccal Region  Severe depletion  Temple Region  Severe depletion  Clavicle Bone Region  Severe depletion  Clavicle and Acromion Bone Region  Unable to assess  Scapular Bone Region  Unable to assess  Dorsal Hand  Moderate depletion  Patellar  Region  Severe depletion  Anterior Thigh Region  Unable to assess  Posterior Calf Region  Unable to assess  Edema (RD Assessment)  None  Hair  Reviewed  Eyes  Reviewed  Mouth  Reviewed  Skin  Reviewed  Nails  Reviewed       Diet Order:   Diet Order            DIET DYS 2 Room service appropriate? Yes; Fluid consistency: Nectar Thick  Diet effective now              EDUCATION NEEDS:      Skin:  Skin Assessment: Reviewed RN Assessment(bilateral arm ecchymosis)  Last BM:  9/24(Per 9/26 RN note )  Height:   Ht Readings from Last 1 Encounters:  05/17/2018 5\' 11"  (1.803 m)    Weight:   Wt Readings from Last 1 Encounters:  06/09/18 28.9 kg    Ideal Body Weight:  78.2 kg  BMI:  Body mass index is 8.9 kg/m.  Estimated Nutritional Needs:   Kcal:  1600-1800kcal/day  Protein:  65-73g/day  Fluid:  1.2L/day    Lars Masson, RD, LDN  After Hours/Weekend Pager: [phone]

## 2018-06-09 NOTE — Progress Notes (Signed)
   [facility] - [place] at [facility]   Advance care planning  Hospital Day: 0 days Alexander Lara is a 72 y.o. 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   Had a discussion with the patient's power of attorney about goals of care.  Discussed with the patient's POA about difference between DNR, full code and comfort care.  Patient's POA wanted to reverse the CODE STATUS to full code but after my discussions patient remains a DNR.  Palliative care consult has been placed, patient would benefit from hospice home services and this was discussed with the POA.  Will re-discuss this tomorrow morning with patient's POA and patient's girlfriend.  Advance care planning discussed with patient  with additional Family at bedside. All questions in regards to overall condition and expected prognosis answered. The decision was made to continue current code status  CODE STATUS: dnr Time spent: 16 minutes

## 2018-06-09 NOTE — H&P (Signed)
[facility] - [place] at [facility]   PATIENT NAME: Alexander Lara    MR#:  [id number]  DATE OF BIRTH:  14-Dec-1945  DATE OF ADMISSION:  05/25/2018  PRIMARY CARE PHYSICIAN: [facility]   REQUESTING/REFERRING PHYSICIAN: [name], MD  CHIEF COMPLAINT:   Chief Complaint  Patient presents with  . Urinary Retention    HISTORY OF PRESENT ILLNESS:  Cleaven Lara  is a 72 y.o. 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 Pt resides at [facility]. I am told his family was with him, but they are not present at the time of my assessment. The pt does not provide Hx/ROS. He is asleep, but arouses easily and remains alert. When I ask him questions, he says, "Leave me alone." He is cooperative for examination and follows commands. He denies pain, but does not answer any other questions.  Per my understanding of pt's Hx, pt was fairly well up until ~57mo ago, at which time he sustained a fall w/ subdural hematoma. I am told he was hospitalized in the [place] ICU shortly thereafter. He has apparently deteriorated dramatically since that time. I believe pt's sister Alexander Lara is next of kin. Pt apperas severely malnourished. On his last admission, pt was determined to not be a PEG tube candidate.  Pt appears comfortable and is not in distress. He appears chronically ill, but does not appear septic/toxic.  PAST MEDICAL HISTORY:   Past Medical History:  Diagnosis Date  . Anxiety   . Emphysema of lung (HCC)   . Foley catheter in place   . Seizures (HCC)   . Subdural hematoma (HCC)   . Urinary retention     PAST SURGICAL HISTORY:  History reviewed. No pertinent surgical history.  SOCIAL HISTORY:   Social History   Tobacco Use  . Smoking status: Former Games developer    . Smokeless tobacco: Never Used  Substance Use Topics  . Alcohol use: No    Frequency: Never    FAMILY HISTORY:  History reviewed. No pertinent family history.  DRUG ALLERGIES:  No Known Allergies  REVIEW OF SYSTEMS:   Review of Systems  Unable to perform ROS: Patient nonverbal   Pt says, "Leave me alone," and does not answer questions. Possibly volitional, unclear. MEDICATIONS AT HOME:   Prior to Admission medications   Medication Sig Start Date End Date Taking? Authorizing Provider  acetaminophen (TYLENOL) 325 MG tablet Take 650 mg by mouth every 6 (six) hours as needed for mild pain.   Yes [provider]  docusate (COLACE) 50 MG/5ML liquid Take 100 mg by mouth daily.   Yes [provider]  ENSURE PLUS (ENSURE PLUS) LIQD Take 237 mLs by mouth 2 (two) times daily between meals.   Yes [provider]  ferrous sulfate 220 (44 Fe) MG/5ML solution Take 330 mg by mouth 2 (two) times daily with a meal.   Yes [provider]  levETIRAcetam (KEPPRA) 100 MG/ML solution Take 500 mg by mouth 2 (two) times daily.   Yes [provider]  magnesium oxide (MAG-OX) 400 (241.3 Mg) MG tablet Take 1 tablet (400 mg total) by mouth daily. 05/19/18  Yes Enedina Finner, MD  megestrol (MEGACE) 400 MG/10ML suspension Take 20 mLs (800 mg total) by mouth daily. 05/19/18  Yes Enedina Finner, MD  Multiple Vitamin (MULTIVITAMIN) LIQD Take 15 mLs by mouth daily.   Yes [provider]  phosphorus (K PHOS NEUTRAL) 155-852-130 MG tablet Take 2 tablets (500 mg total) by mouth daily. 05/19/18  Yes Enedina Finner, MD  tamsulosin (FLOMAX) 0.4 MG CAPS capsule Take 1 capsule (0.4 mg total) by mouth daily. 05/19/18  Yes Enedina Finner, MD  vitamin C (VITAMIN C) 250 MG tablet Take 1 tablet (250 mg total) by mouth 2 (two) times daily. 05/18/18  Yes Enedina Finner, MD      VITAL SIGNS:  Blood pressure 138/76, pulse (!) 105, temperature 98.7 F (37.1 C), temperature source Oral, resp. rate  15, height 5\' 11"  (1.803 m), weight 37.5 kg, SpO2 97 %.  PHYSICAL EXAMINATION:  Physical Exam  Constitutional: He appears well-developed. He appears cachectic. He is active and cooperative.  Non-toxic appearance. He has a sickly appearance. He appears ill. No distress. He is not intubated.  HENT:  Head: Atraumatic.  Mouth/Throat: Oropharynx is clear and moist. No oropharyngeal exudate.  Eyes: Conjunctivae, EOM and lids are normal. No scleral icterus.  Neck: Neck supple. No JVD present. No thyromegaly present.  Cardiovascular: Regular rhythm, S1 normal, S2 normal and normal heart sounds. Tachycardia present. Exam reveals no gallop, no S3, no S4, no distant heart sounds and no friction rub.  No murmur heard. Pulmonary/Chest: Effort normal. No accessory muscle usage or stridor. No apnea, no tachypnea and no bradypnea. He is not intubated. No respiratory distress. He has decreased breath sounds in the right lower field and the left lower field. He has no wheezes. He has no rhonchi. He has no rales.  Abdominal: Soft. He exhibits no distension. Bowel sounds are decreased. There is no tenderness. There is no rigidity, no rebound and no guarding.  Musculoskeletal: He exhibits no edema or tenderness.  Lymphadenopathy:    He has no cervical adenopathy.  Neurological: He is alert.  Skin: Skin is warm and dry. He is not diaphoretic. No erythema. There is pallor.  Psychiatric: His affect is not angry. He is withdrawn. He is not agitated, not aggressive and not combative. He is noncommunicative. He is attentive.   LABORATORY PANEL:   CBC Recent Labs  Lab 05/26/2018 2045  WBC 11.5*  HGB 9.9*  HCT 28.9*  PLT 316   ------------------------------------------------------------------------------------------------------------------  Chemistries  Recent Labs  Lab 06/01/2018 2045  NA 144  K 3.8  CL 112*  CO2 22  GLUCOSE 108*  BUN 38*  CREATININE 1.00  CALCIUM 8.5*  MG 1.9  AST 14*  ALT 11    ALKPHOS 51  BILITOT 0.6   ------------------------------------------------------------------------------------------------------------------  Cardiac Enzymes No results for input(s): TROPONINI in the last 168 hours. ------------------------------------------------------------------------------------------------------------------  RADIOLOGY:  Dg Chest Portable 1 View  Result Date: 05/22/2018 CLINICAL DATA:  Cough today with cloudy urine and recent diagnosis of right lower lobe pneumonia 3 weeks ago. History of emphysema and former smoker. EXAM: PORTABLE CHEST 1 VIEW COMPARISON:  None. FINDINGS: Emphysematous hyperinflation of lungs. The patient is slightly rotated on this study accentuating the paramediastinal soft tissues on the right. Partial clearing of previously noted right lower lobe pneumonia. Minimal right upper lobe pneumonia is also noted, similar in appearance to prior abutting the minor fissure. No new pulmonary consolidations. No effusion or pneumothorax. Mild aortic atherosclerosis. No acute osseous abnormality. IMPRESSION: Partial clearing of right sided pneumonia in the background of emphysema. Electronically Signed   By: Tollie Eth M.D.   On: 05/30/2018 23:32   IMPRESSION AND  PLAN:   A/P: 11M recurrent UTI (complicated; in setting of urinary retention, w/ chronic indwelling urinary catheter), severe protein calorie malnutrition/FTT. Hyperchloremia, prerenal azotemia, dehydration, hypocalcemia, hypoalbuminemia, leukocytosis, microcytic anemia. -Recurrent UTI, complicated UTI, chronic urinary retention, chronic indwelling urinary catheter: Recent admit for UTI. Reported to have dark cloudy urine. U/A (+) nitrite, leukocyte esterase, protein, bacteria, RBC, WBC. (+) leukocytosis, WBC < 12, afebrile, tachycardic, SIRS (-). BCx, UCx pending. Ceftriaxone. May benefit from chronic ABx suppressive therapy on D/C. -Protein calorie malnutrition/FTT, hypoalbuminemia: Prealbumin. Not a PEG  candidate per recent D/C documentation. On Megace, cont'd. -Hyperchloremia, prerenal azotemia, dehydration: Clinically dehydrated. Cr 1.0 on present admission, baseline 0.6-0.8, (-) AKI, (+) dehydration, BUN/Cr ratio > 20. Gentle IVF. -Hypocalcemia: Ionized calcium. -Microcytic anemia: Hgb 9.9, stable. MCV 77.7. Likely anemia of chronic disease + iron deficiency anemia. May benefit from iron infusion. -c/w home meds. -FEN/GI: Regular diet. -DVT PPx: Lovenox. -Code status: DNR/DNI. -Disposition: Admission, > 2 midnights.   All the records are reviewed and case discussed with ED provider. Management plans discussed with the patient, family and they are in agreement.  CODE STATUS: DNR/DNI.  TOTAL TIME TAKING CARE OF THIS PATIENT: 75 minutes.    Barbaraann Rondo M.D on 06/09/2018 at 1:41 AM  Between 7am to 6pm - Pager - [phone]  After 6pm go to [email or web address] - Social research officer, government  [facility] [place] [facility]  Office  [phone]  CC: Primary care physician; [facility]   Note: This dictation was prepared with Dragon dictation along with smaller phrase technology. Any transcriptional errors that result from this process are unintentional.

## 2018-06-09 NOTE — Clinical Social Work Note (Signed)
Clinical Social Work Assessment  Patient Details  Name: Dat Derksen MRN: [id number] Date of Birth: [birth year]  Date of referral:  06/09/18               Reason for consult:  End of Life/Hospice, Other (Comment Required)(From [facility] )                Permission sought to share information with:  Facility Industrial/product designer granted to share information::  Yes, Verbal Permission Granted  Name::      [facility]   Agency::     Relationship::     Contact Information:     Housing/Transportation Living arrangements for the past 2 months:  Skilled Nursing Facility Source of Information:  Other (Comment Required)(Fiance ) Patient Interpreter Needed:  None Criminal Activity/Legal Involvement Pertinent to Current Situation/Hospitalization:  No - Comment as needed Significant Relationships:  Siblings, Significant Other Lives with:  Facility Resident Do you feel safe going back to the place where you live?  Yes Need for family participation in patient care:  Yes (Comment)  Care giving concerns:  Patient is a resident at [facility].    Social Worker assessment / plan:  Visual merchandiser (CSW) reviewed chart and noted that patient is from [facility]. Per Gavin Pound admissions coordinator at [facility] patient was under a Management consultant at [employer] followed by [employer]. Per Gavin Pound hospice discharged patient and then [facility] SN[facility] services. Per Gavin Pound patient has medicaid pending. Per Gavin Pound they are willing to work with patient and his family to bring him back into [place]. CSW attempted to meet with patient however he was alone at bedside and was not alert and oriented. CSW contacted patient's fiance [name]. Per [name] she does not want patient to go back to [facility]. Per [name] she is working on bringing patient home to her house. Per [name] she is moving to a apartment on the first floor October 1st and wants to bring him  home then. CSW explained to [name] that patient will likely be stable for D/C prior to October 1st. Per [name] if that is the case she would like for patient to return to [facility].   [name] called CSW for a second time this afternoon stating that patient's sister spoke with the MD and he recommended hospice services. [name] reported that she and patient's sister are agreeable for patient to D/C to [facility]. CSW explained that a referral can be made and then it will be determined if and when he can transfer. [name] reported that she wants patient to be kept on keppra for seizures and wants to make sure is catheter is not clogged. [facility] liaison is aware of above. CSW will continue to follow and assist as needed.   Employment status:  Disabled (Comment on whether or not currently receiving Disability) Insurance information:  VA Benefit, Self Pay (Medicaid Pending) PT Recommendations:  Not assessed at this time Information / Referral to community resources:  Other (Comment Required)(Hospice Facility vs. SNF )  Patient/Family's Response to care:  Patient's fiance chose [facility].   Patient/Family's Understanding of and Emotional Response to Diagnosis, Current Treatment, and Prognosis:  Patient's fiance was very pleasant and thanked CSW for assistance.   Emotional Assessment Appearance:  Appears stated age Attitude/Demeanor/Rapport:  Unable to Assess Affect (typically observed):  Unable to Assess Orientation:  Oriented to Self, Fluctuating Orientation (Suspected and/or reported Sundowners) Alcohol /  Substance use:  Not Applicable Psych involvement (Current and /or in the community):  No (Comment)  Discharge Needs  Concerns to be addressed:  Discharge Planning Concerns Readmission within the last 30 days:  No Current discharge risk:  Dependent with Mobility, Cognitively Impaired, Chronically ill Barriers to Discharge:  Continued Medical Work  up   [employer], Darleen Crocker, LCSW 06/09/2018, 5:44 PM

## 2018-06-09 NOTE — Progress Notes (Signed)
[facility] - [place] at [facility]   PATIENT NAME: Alexander Lara    MR#:  [id number]  DATE OF BIRTH:  [birth year]  SUBJECTIVE:   Patient here due to suspected urinary retention and a UTI.  Patient is nonverbal and cannot provide a history.  Patient is afebrile, Hemodynamically stable.   REVIEW OF SYSTEMS:    Review of Systems  Unable to perform ROS: Mental acuity    Nutrition: Dysphagia II, Nectar Thick Tolerating Diet: Little Tolerating PT: Bedbound.  DRUG ALLERGIES:  No Known Allergies  VITALS:  Blood pressure (!) 151/86, pulse 98, temperature 98.1 F (36.7 C), temperature source Oral, resp. rate 20, height 5\' 11"  (1.803 m), weight 28.9 kg, SpO2 97 %.  PHYSICAL EXAMINATION:   Physical Exam  GENERAL:  72 y.o.-year-old cachectic, emaciated patient lying in bed non-verbal in NAD.  EYES: Pupils equal, round, reactive to light. No scleral icterus. Extraocular muscles intact.  HEENT: Head atraumatic, normocephalic. Oropharynx and nasopharynx clear. Dry oral Mucosa.  NECK:  Supple, no jugular venous distention. No thyroid enlargement, no tenderness.  LUNGS: Normal breath sounds bilaterally, no wheezing, rales, rhonchi. No use of accessory muscles of respiration.  CARDIOVASCULAR: S1, S2 normal. No murmurs, rubs, or gallops.  ABDOMEN: Soft, nontender, nondistended. Bowel sounds present. No organomegaly or mass.  EXTREMITIES: No cyanosis, clubbing or edema b/l.    NEUROLOGIC: Cranial nerves II through XII are intact. No focal Motor or sensory deficits b/l.  Globally weak PSYCHIATRIC: The patient is alert and oriented x 1.  SKIN: No obvious rash, lesion, or ulcer.    LABORATORY PANEL:   CBC Recent Labs  Lab 27-Jun-2018 2045  WBC 11.5*  HGB 9.9*  HCT 28.9*  PLT 316   ------------------------------------------------------------------------------------------------------------------  Chemistries  Recent Labs  Lab 2018/06/27 2045  NA 144  K 3.8  CL 112*   CO2 22  GLUCOSE 108*  BUN 38*  CREATININE 1.00  CALCIUM 8.5*  MG 1.9  AST 14*  ALT 11  ALKPHOS 51  BILITOT 0.6   ------------------------------------------------------------------------------------------------------------------  Cardiac Enzymes No results for input(s): TROPONINI in the last 168 hours. ------------------------------------------------------------------------------------------------------------------  RADIOLOGY:  Dg Chest Portable 1 View  Result Date: 27-Jun-2018 CLINICAL DATA:  Cough today with cloudy urine and recent diagnosis of right lower lobe pneumonia 3 weeks ago. History of emphysema and former smoker. EXAM: PORTABLE CHEST 1 VIEW COMPARISON:  None. FINDINGS: Emphysematous hyperinflation of lungs. The patient is slightly rotated on this study accentuating the paramediastinal soft tissues on the right. Partial clearing of previously noted right lower lobe pneumonia. Minimal right upper lobe pneumonia is also noted, similar in appearance to prior abutting the minor fissure. No new pulmonary consolidations. No effusion or pneumothorax. Mild aortic atherosclerosis. No acute osseous abnormality. IMPRESSION: Partial clearing of right sided pneumonia in the background of emphysema. Electronically Signed   By: Tollie Eth M.D.   On: 27-Jun-2018 23:32     ASSESSMENT AND PLAN:   72 year old male with past medical history of subdural hematoma, seizures, urinary retention, chronic Foley catheter, anxiety who presents from a skilled nursing facility due to urinary retention and suspected UTI.  1.  Urinary retention/UTI-patient has a chronic indwelling Foley and therefore his urinalysis is always abnormal.  Previously patient has had a Proteus UTI.  He now presents with pyuria but is afebrile and hemodynamically stable. - Unclear if he is truly has a UTI, continue empiric ceftriaxone for now.  Will likely DC antibiotics by tomorrow if urine cultures remain negative.  2.  History  of seizures-continue Keppra.  3.  BPH-continue Flomax.  4.  Adult failure to thrive-patient is cachectic and emaciated with recurrent hospitalizations.  Prognosis is quite poor. -Continue Megace, nutritional supplements.     All the records are reviewed and case discussed with Care Management/Social Worker. Management plans discussed with the patient, family and they are in agreement.  CODE STATUS: DNR  DVT Prophylaxis: Lovenox  TOTAL TIME TAKING CARE OF THIS PATIENT: 30 minutes.   POSSIBLE D/C IN 1-2 DAYS, DEPENDING ON CLINICAL CONDITION.   Houston Siren M.D on 06/09/2018 at 3:42 PM  Between 7am to 6pm - Pager - [phone]  After 6pm go to [email or web address] - Therapist, nutritional Hospitalists  Office  [phone]  CC: Primary care physician; [facility]

## 2018-06-09 NOTE — Consult Note (Signed)
Consultation Note Date: 06/09/2018   Patient Name: Alexander Lara  DOB: June 03, 1946  MRN: [id number]  Age / Sex: 72 y.o., male  PCP: [facility] Referring Physician: Houston Siren, MD  Reason for Consultation: Establishing goals of care and Psychosocial/spiritual support  HPI/Patient Profile: 72 y.o. male   admitted on 05/18/2018 with past medical history significant subdural hematoma and its sequelae.   Patient currently is a resident of skilled nursing facility/[facility].  He has had several hospitalizations in the last 6 months secondary to overall failure to thrive.  Frequent urinary tract infections, severe protein calorie malnutrition, HCAP.  Apparently artificial feeding was discussed in the past and decision for no PEG was made at that time.  His Sister Leonie Man is the main decision maker, however his significant other of over 30 years Levon Hedger strong impact on decisions made for this patient's healthcare.    He is a lifetime Sales executive from [employer] and has gotten much of his health care in the past through the [place].  Does have 3 children who are estranged and apparently live in [place].  Important decisions regarding treatment options, advance directives, and anticipatory care needs are evident.    Clinical Assessment and Goals of Care:  This NP Lorinda Creed reviewed medical records, received report from team, assessed the patient and then meet at the patient's bedside and spojke by telephone with sister/ Leonie Man   to discuss diagnosis, prognosis, GOC, EOL wishes disposition and options.  Concept of Hospice and Palliative Care were discussed  A detailed discussion was had today regarding advanced directives.  Concepts specific to code status, artifical feeding and hydration, continued IV antibiotics and rehospitalization was had.  The difference between a  aggressive medical intervention path  and a palliative comfort care path for this patient at this time was had.  Values and goals of care important to patient and family were attempted to be elicited.  Discussed limitations of medical interventions to prolong quality of life for this patient at this time in this situation and discussed the concept of human mortality.  Natural trajectory and expectations at end of life were discussed.  Questions and concerns were addressed.  Patient's sister asked that I call Levon Hedger to discuss and have the same conversation with her.  Similar conversation to have been had in the past but Ms. Womack holds onto all offered and available medical interventions to prolong life in spite of high risk to decompensate, limitations of medical interventions to prolong quality of life and long-term poor prognosis  I attempted to call his significant other Levon Hedger multiple times without success.  I did leave a callback number  PMT will continue to support holistically.     HCPOA/next of kin    SUMMARY OF RECOMMENDATIONS    Code Status/Advance Care Planning:  DNR   Palliative Prophylaxis:   Aspiration, Bowel Regimen, Delirium Protocol, Frequent Pain Assessment and Oral Care  Additional Recommendations (Limitations, Scope, Preferences):  Full Scope Treatment  Psycho-social/Spiritual:   Desire for further Chaplaincy support:no  Additional Recommendations: Education on Hospice  Prognosis:   Unable to determine  Discharge Planning: His sister mentions to me that patient's significant other is considering taking the patient home and having out-of-pocket caregivers.  At a minimum they are requesting to consider an alternate SNF placement.  Patient did have hospice services at his facility up until 3 weeks ago.   To Be Determined      Primary Diagnoses: Present on Admission: . Recurrent UTI (urinary tract infection)   I have reviewed the  medical record, interviewed the patient and family, and examined the patient. The following aspects are pertinent.  Past Medical History:  Diagnosis Date  . Anxiety   . Emphysema of lung (HCC)   . Foley catheter in place   . Seizures (HCC)   . Subdural hematoma (HCC)   . Urinary retention    Social History   Socioeconomic History  . Marital status: Divorced    Spouse name: Not on file  . Number of children: Not on file  . Years of education: Not on file  . Highest education level: Not on file  Occupational History  . Not on file  Social Needs  . Financial resource strain: Not on file  . Food insecurity:    Worry: Not on file    Inability: Not on file  . Transportation needs:    Medical: Not on file    Non-medical: Not on file  Tobacco Use  . Smoking status: Former Games developer  . Smokeless tobacco: Never Used  Substance and Sexual Activity  . Alcohol use: No    Frequency: Never  . Drug use: No  . Sexual activity: Not on file  Lifestyle  . Physical activity:    Days per week: Not on file    Minutes per session: Not on file  . Stress: Not on file  Relationships  . Social connections:    Talks on phone: Not on file    Gets together: Not on file    Attends religious service: Not on file    Active member of club or organization: Not on file    Attends meetings of clubs or organizations: Not on file    Relationship status: Not on file  Other Topics Concern  . Not on file  Social History Narrative  . Not on file   History reviewed. No pertinent family history. Scheduled Meds: . enoxaparin (LOVENOX) injection  30 mg Subcutaneous Q24H  . feeding supplement (NEPRO CARB STEADY)  237 mL Oral BID BM  . levETIRAcetam  500 mg Oral BID  . megestrol  800 mg Oral Daily  . multivitamin with minerals  1 tablet Oral Daily  . tamsulosin  0.4 mg Oral Daily   Continuous Infusions: . cefTRIAXone (ROCEPHIN)  IV    . lactated ringers 50 mL/hr at 06/09/18 0218   PRN  Meds:.acetaminophen **OR** acetaminophen, bisacodyl, ondansetron **OR** ondansetron (ZOFRAN) IV, senna-docusate Medications Prior to Admission:  Prior to Admission medications   Medication Sig Start Date End Date Taking? Authorizing Provider  acetaminophen (TYLENOL) 325 MG tablet Take 650 mg by mouth every 6 (six) hours as needed for mild pain.   Yes [provider]  docusate (COLACE) 50 MG/5ML liquid Take 100 mg by mouth daily.   Yes [provider]  ENSURE PLUS (ENSURE PLUS) LIQD Take 237 mLs by mouth 2 (two) times daily between meals.   Yes [provider]  ferrous sulfate 220 (  44 Fe) MG/5ML solution Take 330 mg by mouth 2 (two) times daily with a meal.   Yes [provider]  levETIRAcetam (KEPPRA) 100 MG/ML solution Take 500 mg by mouth 2 (two) times daily.   Yes [provider]  magnesium oxide (MAG-OX) 400 (241.3 Mg) MG tablet Take 1 tablet (400 mg total) by mouth daily. 05/19/18  Yes Enedina Finner, MD  megestrol (MEGACE) 400 MG/10ML suspension Take 20 mLs (800 mg total) by mouth daily. 05/19/18  Yes Enedina Finner, MD  Multiple Vitamin (MULTIVITAMIN) LIQD Take 15 mLs by mouth daily.   Yes [provider]  phosphorus (K PHOS NEUTRAL) 155-852-130 MG tablet Take 2 tablets (500 mg total) by mouth daily. 05/19/18  Yes Enedina Finner, MD  tamsulosin (FLOMAX) 0.4 MG CAPS capsule Take 1 capsule (0.4 mg total) by mouth daily. 05/19/18  Yes Enedina Finner, MD  vitamin C (VITAMIN C) 250 MG tablet Take 1 tablet (250 mg total) by mouth 2 (two) times daily. 05/18/18  Yes Enedina Finner, MD   No Known Allergies Review of Systems  Unable to perform ROS: Acuity of condition    Physical Exam  Constitutional: He appears lethargic. He appears cachectic. He appears ill.  Cardiovascular: Tachycardia present.  Pulmonary/Chest: He has decreased breath sounds.  Musculoskeletal:  -Overall muscle weakness and atrophy  Neurological: He appears lethargic.  -Nonverbal and unable to  follow commands  Skin: Skin is warm and dry.    Vital Signs: BP (!) 151/86 (BP Location: Left Leg)   Pulse 98   Temp 98.1 F (36.7 C) (Oral)   Resp 20   Ht 5\' 11"  (1.803 m)   Wt 28.9 kg   SpO2 97%   BMI 8.90 kg/m  Pain Scale: 0-10   Pain Score: 0-No pain   SpO2: SpO2: 97 % O2 Device:SpO2: 97 % O2 Flow Rate: .   IO: Intake/output summary:   Intake/Output Summary (Last 24 hours) at 06/09/2018 1019 Last data filed at 06/09/2018 0900 Gross per 24 hour  Intake 474.58 ml  Output 200 ml  Net 274.58 ml    LBM:   Baseline Weight: Weight: 37.5 kg Most recent weight: Weight: 28.9 kg     Palliative Assessment/Data: 30 % at best   Discussed with Dr Cherlynn Kaiser  Time In: 1000 Time Out: 1100 Time Total: 60 minutes Greater than 50%  of this time was spent counseling and coordinating care related to the above assessment and plan.  Signed by: Lorinda Creed, NP   Please contact Palliative Medicine Team phone at [phone] for questions and concerns.  For individual provider: See Loretha Stapler

## 2018-06-09 NOTE — Plan of Care (Signed)

## 2018-06-09 NOTE — Progress Notes (Signed)
Talked to Dr. Elpidio Anis about patient's being wheezy, order for PRN Albuterol given. Also patient's family requested for melatonin, order given. RN will continue to monitor.

## 2018-06-09 NOTE — NC FL2 (Signed)
[place] MEDICAID FL2 LEVEL OF CARE SCREENING TOOL     IDENTIFICATION  Patient Name: Alexander Lara Birthdate: 01-Mar-1946 Sex: male Admission Date (Current Location): 05/17/2018  [place] and [place] Number:  Chiropodist and Address:  [facility], [address], [place], [place] [address]      Provider Number: [id number]  Attending Physician Name and Address:  Houston Siren, MD  Relative Name and Phone Number:       Current Level of Care: Hospital Recommended Level of Care: Skilled Nursing Facility Prior Approval Number:    Date Approved/Denied:   PASRR Number: [id number])  Discharge Plan: SNF    Current Diagnoses: Patient Active Problem List   Diagnosis Date Noted  . Recurrent UTI (urinary tract infection) 06/09/2018  . Adult failure to thrive   . Palliative care by specialist   . Protein-calorie malnutrition, severe 05/17/2018  . Sepsis (HCC) 05/14/2018    Orientation RESPIRATION BLADDER Height & Weight     Self  Normal Continent Weight: 63 lb 12.8 oz (28.9 kg) Height:  5\' 11"  (180.3 cm)  BEHAVIORAL SYMPTOMS/MOOD NEUROLOGICAL BOWEL NUTRITION STATUS      Incontinent Diet(Diet: DYS 2 )  AMBULATORY STATUS COMMUNICATION OF NEEDS Skin   Extensive Assist Verbally Normal                       Personal Care Assistance Level of Assistance  Bathing, Feeding, Dressing Bathing Assistance: Limited assistance Feeding assistance: Independent Dressing Assistance: Limited assistance     Functional Limitations Info  Sight, Hearing, Speech Sight Info: Adequate Hearing Info: Adequate Speech Info: Adequate    SPECIAL CARE FACTORS FREQUENCY  PT (By licensed PT)     PT Frequency: (5)              Contractures      Additional Factors Info  Code Status, Allergies Code Status Info: (DNR ) Allergies Info: (No Known Allergies. )           Current Medications (06/09/2018):  This is the current hospital active  medication list Current Facility-Administered Medications  Medication Dose Route Frequency Provider Last Rate Last Dose  . acetaminophen (TYLENOL) tablet 650 mg  650 mg Oral Q6H PRN Barbaraann Rondo, MD       Or  . acetaminophen (TYLENOL) suppository 650 mg  650 mg Rectal Q6H PRN Barbaraann Rondo, MD      . bisacodyl (DULCOLAX) EC tablet 5 mg  5 mg Oral Daily PRN Barbaraann Rondo, MD      . cefTRIAXone (ROCEPHIN) 1 g in sodium chloride 0.9 % 100 mL IVPB  1 g Intravenous Q24H Sridharan, Prasanna, MD      . enoxaparin (LOVENOX) injection 30 mg  30 mg Subcutaneous Q24H Sridharan, Prasanna, MD      . feeding supplement (NEPRO CARB STEADY) liquid 237 mL  237 mL Oral TID BM Sainani, Rolly Pancake, MD      . levETIRAcetam (KEPPRA) 100 MG/ML solution 500 mg  500 mg Oral BID Barbaraann Rondo, MD   500 mg at 06/09/18 1157  . megestrol (MEGACE) 400 MG/10ML suspension 800 mg  800 mg Oral Daily Barbaraann Rondo, MD   800 mg at 06/09/18 1157  . multivitamin with minerals tablet 1 tablet  1 tablet Oral Daily Barbaraann Rondo, MD   1 tablet at 06/09/18 1018  . ondansetron (ZOFRAN) tablet 4 mg  4 mg Oral Q6H PRN Barbaraann Rondo, MD  Or  . ondansetron (ZOFRAN) injection 4 mg  4 mg Intravenous Q6H PRN Barbaraann Rondo, MD      . senna-docusate (Senokot-S) tablet 1 tablet  1 tablet Oral QHS PRN Barbaraann Rondo, MD      . tamsulosin (FLOMAX) capsule 0.4 mg  0.4 mg Oral Daily Barbaraann Rondo, MD   0.4 mg at 06/09/18 1018     Discharge Medications: Please see discharge summary for a list of discharge medications.  Relevant Imaging Results:  Relevant Lab Results:   Additional Information (SSN: [id number])  Nonie Lochner, Darleen Crocker, LCSW

## 2018-06-10 LAB — BLOOD CULTURE ID PANEL (REFLEXED)
ACINETOBACTER BAUMANNII: NOT DETECTED
CANDIDA ALBICANS: NOT DETECTED
CANDIDA GLABRATA: NOT DETECTED
CANDIDA KRUSEI: NOT DETECTED
Candida parapsilosis: NOT DETECTED
Candida tropicalis: NOT DETECTED
Carbapenem resistance: NOT DETECTED
ENTEROBACTER CLOACAE COMPLEX: NOT DETECTED
ENTEROBACTERIACEAE SPECIES: DETECTED — AB
ENTEROCOCCUS SPECIES: NOT DETECTED
ESCHERICHIA COLI: NOT DETECTED
Haemophilus influenzae: NOT DETECTED
KLEBSIELLA OXYTOCA: NOT DETECTED
Klebsiella pneumoniae: NOT DETECTED
LISTERIA MONOCYTOGENES: NOT DETECTED
Neisseria meningitidis: NOT DETECTED
PSEUDOMONAS AERUGINOSA: NOT DETECTED
Proteus species: DETECTED — AB
STAPHYLOCOCCUS AUREUS BCID: NOT DETECTED
STREPTOCOCCUS PNEUMONIAE: NOT DETECTED
STREPTOCOCCUS PYOGENES: NOT DETECTED
Serratia marcescens: NOT DETECTED
Staphylococcus species: NOT DETECTED
Streptococcus agalactiae: NOT DETECTED
Streptococcus species: NOT DETECTED

## 2018-06-10 LAB — BASIC METABOLIC PANEL
Anion gap: 10 (ref 5–15)
BUN: 34 mg/dL — AB (ref 8–23)
CO2: 21 mmol/L — ABNORMAL LOW (ref 22–32)
Calcium: 8.4 mg/dL — ABNORMAL LOW (ref 8.9–10.3)
Chloride: 114 mmol/L — ABNORMAL HIGH (ref 98–111)
Creatinine, Ser: 0.83 mg/dL (ref 0.61–1.24)
GFR calc non Af Amer: >60 mL/min (ref >60)
Glucose, Bld: 103 mg/dL — ABNORMAL HIGH (ref 70–99)
POTASSIUM: 3.7 mmol/L (ref 3.5–5.1)
SODIUM: 145 mmol/L (ref 135–145)

## 2018-06-10 LAB — CBC
HEMATOCRIT: 25.4 % — AB (ref 40.0–52.0)
Hemoglobin: 8.7 g/dL — ABNORMAL LOW (ref 13.0–18.0)
MCH: 26.6 pg (ref 26.0–34.0)
MCHC: 34.3 g/dL (ref 32.0–36.0)
MCV: 77.6 fL — ABNORMAL LOW (ref 80.0–100.0)
Platelets: 274 10*3/uL (ref 150–440)
RBC: 3.27 MIL/uL — AB (ref 4.40–5.90)
RDW: 25.6 % — AB (ref 11.5–14.5)
WBC: 8.9 10*3/uL (ref 3.8–10.6)

## 2018-06-10 MED ORDER — HALOPERIDOL LACTATE 2 MG/ML PO CONC
0.5000 mg | ORAL | Status: DC | PRN
  Filled 2018-06-10: qty 0.3

## 2018-06-10 MED ORDER — LORAZEPAM 2 MG/ML PO CONC
1.0000 mg | ORAL | Status: DC | PRN
  Filled 2018-06-10: qty 0.5

## 2018-06-10 MED ORDER — POLYVINYL ALCOHOL 1.4 % OP SOLN
1.0000 [drp] | Freq: Four times a day (QID) | OPHTHALMIC | Status: DC | PRN
  Administered 2018-06-18: 1 [drp] via OPHTHALMIC
  Filled 2018-06-10 (×2): qty 15

## 2018-06-10 MED ORDER — SODIUM CHLORIDE 0.9 % IV SOLN
2.0000 g | INTRAVENOUS | Status: DC
  Administered 2018-06-10 – 2018-06-12 (×3): 2 g via INTRAVENOUS
  Filled 2018-06-10: qty 20
  Filled 2018-06-10 (×3): qty 2

## 2018-06-10 MED ORDER — LORAZEPAM 1 MG PO TABS: 1.0000 mg | ORAL_TABLET | ORAL | Status: DC | PRN

## 2018-06-10 MED ORDER — HALOPERIDOL LACTATE 5 MG/ML IJ SOLN: 0.5000 mg | INTRAMUSCULAR | Status: DC | PRN

## 2018-06-10 MED ORDER — HALOPERIDOL 0.5 MG PO TABS
0.5000 mg | ORAL_TABLET | ORAL | Status: DC | PRN
  Filled 2018-06-10: qty 1

## 2018-06-10 MED ORDER — TAMSULOSIN HCL 0.4 MG PO CAPS
0.4000 mg | ORAL_CAPSULE | Freq: Every day | ORAL | Status: DC
  Administered 2018-06-11 – 2018-06-14 (×4): 0.4 mg via ORAL
  Filled 2018-06-10 (×4): qty 1

## 2018-06-10 MED ORDER — GLYCOPYRROLATE 0.2 MG/ML IJ SOLN
0.2000 mg | INTRAMUSCULAR | Status: DC | PRN
  Filled 2018-06-10: qty 1

## 2018-06-10 MED ORDER — LORAZEPAM 2 MG/ML IJ SOLN: 1.0000 mg | INTRAMUSCULAR | Status: DC | PRN

## 2018-06-10 MED ORDER — GLYCOPYRROLATE 1 MG PO TABS
1.0000 mg | ORAL_TABLET | ORAL | Status: DC | PRN
  Filled 2018-06-10: qty 1

## 2018-06-10 MED ORDER — SODIUM CHLORIDE 0.9% FLUSH: 3.0000 mL | Freq: Two times a day (BID) | INTRAVENOUS | Status: DC

## 2018-06-10 MED ORDER — ONDANSETRON 4 MG PO TBDP: 4.0000 mg | ORAL_TABLET | Freq: Four times a day (QID) | ORAL | Status: DC | PRN

## 2018-06-10 MED ORDER — ACETAMINOPHEN 650 MG RE SUPP: 650.0000 mg | Freq: Four times a day (QID) | RECTAL | Status: DC | PRN

## 2018-06-10 MED ORDER — MORPHINE SULFATE (PF) 2 MG/ML IV SOLN: 1.0000 mg | INTRAVENOUS | Status: DC | PRN

## 2018-06-10 MED ORDER — SODIUM CHLORIDE 0.9 % IV SOLN: 250.0000 mL | INTRAVENOUS | Status: DC | PRN

## 2018-06-10 MED ORDER — ACETAMINOPHEN 325 MG PO TABS: 650.0000 mg | ORAL_TABLET | Freq: Four times a day (QID) | ORAL | Status: DC | PRN

## 2018-06-10 MED ORDER — BIOTENE DRY MOUTH MT LIQD: 15.0000 mL | OROMUCOSAL | Status: DC | PRN

## 2018-06-10 MED ORDER — ADULT MULTIVITAMIN W/MINERALS CH
1.0000 | ORAL_TABLET | Freq: Every day | ORAL | Status: DC
  Administered 2018-06-11 – 2018-06-15 (×5): 1 via ORAL
  Filled 2018-06-10 (×5): qty 1

## 2018-06-10 MED ORDER — SODIUM CHLORIDE 0.9% FLUSH: 3.0000 mL | INTRAVENOUS | Status: DC | PRN

## 2018-06-10 MED ORDER — ONDANSETRON HCL 4 MG/2ML IJ SOLN: 4.0000 mg | Freq: Four times a day (QID) | INTRAMUSCULAR | Status: DC | PRN

## 2018-06-10 NOTE — Progress Notes (Signed)
Plan is for patient to D/C back to [facility] room 320 tomorrow pending medical clearance. Per Tiffany admissions coordinator at [facility] patient can return tomorrow as long as the D/C summary is sent to [facility] by 3:30 pm tomorrow. Patient's sister [name] and patient's fiance [name] are in agreement with plan and no longer want comfort care. MD aware of above.   [employer], LCSW [phone]

## 2018-06-10 NOTE — Progress Notes (Signed)
[id number]- New referral for inpatient hospice.  Called and spoke to Alexander Lara, patient's sister to set up time to meet with her and patient's significant other, Alexander Lara.  I notified Alexander Lara that I left a message with Alexander Lara, but have not received a call back.  Alexander Lara had many questions, which I took time to answer completely.  Discussed hospice home criteria and comfort care at length, as well as financial obligation that could incure as she was very concerned about the cost.  I notified Alexander Lara that  I needed both her and Alexander Lara present for the discussion of inpatient hospice due to the reported inconsistencies in what each of them wanted for the patient.  Alexander Lara verbalized understanding and will call Alexander Lara to notify her of meeting. 1410- Received a call back from Alexander Lara stating that she had talked to Alexander Lara.  She states that she and Alexander Lara agree that they want Alexander Lara to go back to Alexander Lara with Palliative medicine.  I notified her that he could get hospice services, but she states they do not want hospice services, because they want to still seek aggressive treatment.  I called and notified Alexander Lara and also talked to [name] regarding my conversation with Alexander Lara.

## 2018-06-10 NOTE — Progress Notes (Signed)
Clinical Child psychotherapist (CSW) received call from [facility] liaison stating that per patient's sister [name] patient's fiance [name] has now changed her mind and wants aggressive treatment and wants patient to go back to [facility]. CSW attempted to contact patient's sister [name] however she did not answer and her voicemail was full. CSW contacted patient's fiance [name] and left her a Engineer, technical sales.   [employer], LCSW [phone]

## 2018-06-10 NOTE — Progress Notes (Signed)
I have reviewed the assessment done by the student nurse.  I agree with her findings, or I have made corrections

## 2018-06-10 NOTE — Progress Notes (Signed)
[facility] [place] [facility]   PATIENT NAME: Alexander Lara    MR#:  [id number]  DATE OF BIRTH:  23-Oct-1945  SUBJECTIVE:   Urine cultures are positive for 100,000 colonies of gram-negative rod and growing Proteus, blood cultures positive for gram-positive rod but has not been identified yet.  Patient remains afebrile and hemodynamically stable.  REVIEW OF SYSTEMS:    Review of Systems  Unable to perform ROS: Mental acuity    Nutrition: Dysphagia II, Nectar Thick Tolerating Diet: Little Tolerating PT: Bedbound.  DRUG ALLERGIES:  No Known Allergies  VITALS:  Blood pressure (!) 142/79, pulse 96, temperature 98.1 F (36.7 C), temperature source Oral, resp. rate 12, height 5\' 11"  (1.803 m), weight 29.9 kg, SpO2 97 %.  PHYSICAL EXAMINATION:   Physical Exam  GENERAL:  72 y.o.-year-old cachectic, emaciated patient lying in bed non-verbal in NAD.  EYES: Pupils equal, round, reactive to light. No scleral icterus. Extraocular muscles intact.  HEENT: Head atraumatic, normocephalic. Oropharynx and nasopharynx clear. Dry oral Mucosa.  NECK:  Supple, no jugular venous distention. No thyroid enlargement, no tenderness.  LUNGS: Normal breath sounds bilaterally, no wheezing, rales, rhonchi. No use of accessory muscles of respiration.  CARDIOVASCULAR: S1, S2 normal. No murmurs, rubs, or gallops.  ABDOMEN: Soft, nontender, nondistended. Bowel sounds present. No organomegaly or mass.  EXTREMITIES: No cyanosis, clubbing or edema b/l.    NEUROLOGIC: Cranial nerves II through XII are intact. No focal Motor or sensory deficits b/l.  Globally weak PSYCHIATRIC: The patient is alert and oriented x 1.  SKIN: No obvious rash, lesion, or ulcer.    LABORATORY PANEL:   CBC Recent Labs  Lab 06/10/18 0432  WBC 8.9  HGB 8.7*  HCT 25.4*  PLT 274   ------------------------------------------------------------------------------------------------------------------  Chemistries   Recent Labs  Lab 08-Jul-2018 2045 06/09/18 1737 06/10/18 0432  NA 144  --  145  K 3.8 3.9 3.7  CL 112*  --  114*  CO2 22  --  21*  GLUCOSE 108*  --  103*  BUN 38*  --  34*  CREATININE 1.00  --  0.83  CALCIUM 8.5*  --  8.4*  MG 1.9 1.8  --   AST 14*  --   --   ALT 11  --   --   ALKPHOS 51  --   --   BILITOT 0.6  --   --    ------------------------------------------------------------------------------------------------------------------  Cardiac Enzymes No results for input(s): TROPONINI in the last 168 hours. ------------------------------------------------------------------------------------------------------------------  RADIOLOGY:  Dg Chest Portable 1 View  Result Date: July 08, 2018 CLINICAL DATA:  Cough today with cloudy urine and recent diagnosis of right lower lobe pneumonia 3 weeks ago. History of emphysema and former smoker. EXAM: PORTABLE CHEST 1 VIEW COMPARISON:  None. FINDINGS: Emphysematous hyperinflation of lungs. The patient is slightly rotated on this study accentuating the paramediastinal soft tissues on the right. Partial clearing of previously noted right lower lobe pneumonia. Minimal right upper lobe pneumonia is also noted, similar in appearance to prior abutting the minor fissure. No new pulmonary consolidations. No effusion or pneumothorax. Mild aortic atherosclerosis. No acute osseous abnormality. IMPRESSION: Partial clearing of right sided pneumonia in the background of emphysema. Electronically Signed   By: Tollie Eth M.D.   On: 07-08-18 23:32     ASSESSMENT AND PLAN:   72 year old male with past medical history of subdural hematoma, seizures, urinary retention, chronic Foley catheter, anxiety who presents from a skilled nursing facility due to  urinary retention and suspected UTI.  1.  Urinary retention/UTI-patient has a chronic indwelling Foley and therefore his urinalysis is always abnormal.  Previously patient has had a Proteus UTI.  He now presents with  pyuria but is afebrile and hemodynamically stable. -Patient's urine cultures are positive again for Proteus.  Cont. Ceftriaxone and await sensitivies.   2.  History of seizures-continue Keppra.  3.  BPH-continue Flomax.  4.  Adult failure to thrive- patient is cachectic and emaciated with recurrent hospitalizations.  Prognosis is quite poor. -Continue Megace, nutritional supplements.  His prognosis is quite poor given his severe cachexia recurrent hospitalizations and failure to thrive.  Palliative care consult obtained to discuss goals of care.  Patient initially was made comfort care and plan was to go to hospice home but this afternoon after speaking with palliative care the patient's girlfriend has reversed her decision and now wants to continue aggressive care.  Patient remains a DNR but will continue to get antibiotics and supportive care and plan is to discharge to skilled nursing facility with hospice to follow.   All the records are reviewed and case discussed with Care Management/Social Worker. Management plans discussed with the patient, family and they are in agreement.  CODE STATUS: DNR  DVT Prophylaxis: Lovenox  TOTAL TIME TAKING CARE OF THIS PATIENT: 30 minutes.   POSSIBLE D/C IN 1-2 DAYS, DEPENDING ON CLINICAL CONDITION.   Houston Siren M.D on 06/10/2018 at 3:22 PM  Between 7am to 6pm - Pager - [phone]  After 6pm go to [email or web address] - Therapist, nutritional Hospitalists  Office  [phone]  CC: Primary care physician; [facility]

## 2018-06-10 NOTE — Progress Notes (Addendum)
Daily Progress Note   Patient Name: Alexander Lara       Date: 06/10/2018 DOB: 05-26-1946  Age: 72 y.o. MRN#: [id number] Attending Physician: Alexander Siren, MD Primary Care Physician: [facility] Admit Date: 2018-07-04  Reason for Consultation/Follow-up: Establishing goals of care  Subjective:  Patient is resting in bed. He is alert and looks at Clinical research associate but does not speak. Significant other Alexander Lara is at bedside and voices concerns reguarding previous experience with hospice.  Breakfast tray at bedside, and he has eaten a few bites. No distress noted.   Spoke with sister Alexander Lara via phone who states she would like to pursue  hospice home placement and comfort care. RN Alexander Lara witnessed conversation and spoke with Alexander Lara to confirm her wishes for comfort care, stopping all medications except ones for comfort, and no excalation in care, with desire for a natural death.    Alexander Lara states he has lost around 30 pounds and now weighs 65 pounds despite Megace. She states he has declined in the facility. She no longer wants to see him come to the hospital, or be stuck with needles. She would like him to be comfortable for what time he has left. She asks if he will receive medication for comfort and asked that Keppra be continued; she was reassured. She states she will be here at 2:00 to visit; plan to come to bedside to answer any questions.    Addendum: Alexander Lara was not present at 2:00. I called her. She states she could not make it to the meeting. She states she talked to [facility] and Alexander Lara has changed her mind about hospice. She states they spoke with hospice liaison and they cannot afford the hospice home. She states Alexander Lara wants him brought back to the hospital at any time they  believe he needs it and will not give up. Alexander Lara does not agree with Alexander Lara, but because Alexander Lara pays his bills she will agree with Alexander Lara's decision. Alexander Lara DOES confirm she would like to continue with DNR status. Called SW Alexander Lara to update. Called pharmacist to update as medications were discontinued with plan for comfort care. Spoke with primary MD and Alexander Lara with palliative/hospice.     Length of Stay: 1  Current Medications: Scheduled Meds:  . enoxaparin (LOVENOX) injection  30 mg Subcutaneous Q24H  .  feeding supplement (NEPRO CARB STEADY)  237 mL Oral TID BM  . levETIRAcetam  500 mg Oral BID  . megestrol  800 mg Oral Daily  . Melatonin  5 mg Oral QHS  . multivitamin with minerals  1 tablet Oral Daily  . sodium chloride flush  3 mL Intravenous Q12H  . tamsulosin  0.4 mg Oral Daily    Continuous Infusions: . cefTRIAXone (ROCEPHIN)  IV 1 g (06/09/18 2253)    PRN Meds: acetaminophen **OR** acetaminophen, albuterol, bisacodyl, ondansetron **OR** ondansetron (ZOFRAN) IV, senna-docusate  Physical Exam  Constitutional: No distress.  Pulmonary/Chest: Effort normal.  Neurological: He is alert.  Skin: Skin is warm and dry.            Vital Signs: BP (!) 142/79 (BP Location: Right Arm)   Pulse 96   Temp 98.1 F (36.7 C) (Oral)   Resp 12   Ht 5\' 11"  (1.803 m)   Wt 29.9 kg   SpO2 97%   BMI 9.21 kg/m  SpO2: SpO2: 97 % O2 Device: O2 Device: Room Air O2 Flow Rate:    Intake/output summary:   Intake/Output Summary (Last 24 hours) at 06/10/2018 1040 Last data filed at 06/10/2018 8119 Gross per 24 hour  Intake 240 ml  Output 550 ml  Net -310 ml   LBM: Last BM Date: 06/07/18 Baseline Weight: Weight: 37.5 kg Most recent weight: Weight: 29.9 kg       Palliative Assessment/Data: 20%      Patient Active Problem List   Diagnosis Date Noted  . Recurrent UTI (urinary tract infection) 06/09/2018  . Adult failure to thrive   . Palliative care by specialist   .  Protein-calorie malnutrition, severe 05/17/2018  . Sepsis (HCC) 05/14/2018    Palliative Care Assessment & Plan   Recommendations/Plan: Recommend transfer to hospice home.   Goals of Care and Additional Recommendations:  Limitations on Scope of Treatment: Full Comfort Care  Code Status:    Code Status Orders  (From admission, onward)         Start     Ordered   06/09/18 0210  Do not attempt resuscitation (DNR)  Continuous    Question Answer Comment  In the event of cardiac or respiratory ARREST Do not call a "code blue"   In the event of cardiac or respiratory ARREST Do not perform Intubation, CPR, defibrillation or ACLS   In the event of cardiac or respiratory ARREST Use medication by any route, position, wound care, and other measures to relive pain and suffering. May use oxygen, suction and manual treatment of airway obstruction as needed for comfort.      06/09/18 0209        Code Status History    Date Active Date Inactive Code Status Order ID Comments User Context   05/15/2018 1120 05/18/2018 2303 Full Code [id number]  Alexander Bilberry, MD Inpatient   05/15/2018 0125 05/15/2018 1120 Full Code [id number]  Alexander Copa, MD Inpatient    Advance Directive Documentation     Most Recent Value  Type of Advance Directive  Healthcare Power of Attorney  Pre-existing out of facility DNR order (yellow form or pink MOST form)  -  "MOST" Form in Place?  -       Prognosis:   Very poor.    Discharge Planning:  Unknown at this time.    Thank you for allowing the Palliative Medicine Team to assist in the care of this patient.   Total Time 35 min  Prolonged Time Billed no      Greater than 50%  of this time was spent counseling and coordinating care related to the above assessment and plan.  Alexander Stall, NP  Please contact Palliative Medicine Team phone at [phone] for questions and concerns.

## 2018-06-10 NOTE — Progress Notes (Signed)
Clinical Child psychotherapist (CSW) received a call from patient's fiance [name] who stated that she and patient's sister [name] are agreeable to send patient to the [facility]. CSW contacted patient's HPOA/sister [name] to confirm the above information. Sister is in agreement with [facility]. [facility] liaison is aware of above.   [employer], LCSW [phone]

## 2018-06-10 NOTE — Progress Notes (Signed)
PHARMACY - PHYSICIAN COMMUNICATION CRITICAL VALUE ALERT - BLOOD CULTURE IDENTIFICATION (BCID)  Results for orders placed or performed during the hospital encounter of 05/23/2018  Blood Culture ID Panel (Reflexed) (Collected: 05/15/2018 11:40 PM)  Result Value Ref Range   Enterococcus species NOT DETECTED NOT DETECTED   Listeria monocytogenes NOT DETECTED NOT DETECTED   Staphylococcus species NOT DETECTED NOT DETECTED   Staphylococcus aureus NOT DETECTED NOT DETECTED   Streptococcus species NOT DETECTED NOT DETECTED   Streptococcus agalactiae NOT DETECTED NOT DETECTED   Streptococcus pneumoniae NOT DETECTED NOT DETECTED   Streptococcus pyogenes NOT DETECTED NOT DETECTED   Acinetobacter baumannii NOT DETECTED NOT DETECTED   Enterobacteriaceae species NOT DETECTED NOT DETECTED   Enterobacter cloacae complex NOT DETECTED NOT DETECTED   Escherichia coli NOT DETECTED NOT DETECTED   Klebsiella oxytoca NOT DETECTED NOT DETECTED   Klebsiella pneumoniae NOT DETECTED NOT DETECTED   Proteus species NOT DETECTED NOT DETECTED   Serratia marcescens NOT DETECTED NOT DETECTED   Haemophilus influenzae NOT DETECTED NOT DETECTED   Neisseria meningitidis NOT DETECTED NOT DETECTED   Pseudomonas aeruginosa NOT DETECTED NOT DETECTED   Candida albicans NOT DETECTED NOT DETECTED   Candida glabrata NOT DETECTED NOT DETECTED   Candida krusei NOT DETECTED NOT DETECTED   Candida parapsilosis NOT DETECTED NOT DETECTED   Candida tropicalis NOT DETECTED NOT DETECTED    Name of physician (or Provider) Contacted:   Konidena   Changes to prescribed antibiotics required: No , will continue pt on Ceftriaxone 2 gm IV Q24H  Trevione Wert D 06/10/2018  7:41 PM i

## 2018-06-11 LAB — CBC
HCT: 28.7 % — ABNORMAL LOW (ref 40.0–52.0)
Hemoglobin: 9.7 g/dL — ABNORMAL LOW (ref 13.0–18.0)
MCH: 26.4 pg (ref 26.0–34.0)
MCHC: 33.6 g/dL (ref 32.0–36.0)
MCV: 78.4 fL — ABNORMAL LOW (ref 80.0–100.0)
PLATELETS: 282 10*3/uL (ref 150–440)
RBC: 3.66 MIL/uL — AB (ref 4.40–5.90)
RDW: 25.5 % — ABNORMAL HIGH (ref 11.5–14.5)
WBC: 6.2 10*3/uL (ref 3.8–10.6)

## 2018-06-11 LAB — BASIC METABOLIC PANEL
ANION GAP: 9 (ref 5–15)
BUN: 29 mg/dL — ABNORMAL HIGH (ref 8–23)
CALCIUM: 8.9 mg/dL (ref 8.9–10.3)
CO2: 22 mmol/L (ref 22–32)
CREATININE: 0.91 mg/dL (ref 0.61–1.24)
Chloride: 117 mmol/L — ABNORMAL HIGH (ref 98–111)
GFR calc non Af Amer: >60 mL/min (ref >60)
Glucose, Bld: 98 mg/dL (ref 70–99)
Potassium: 4.2 mmol/L (ref 3.5–5.1)
Sodium: 148 mmol/L — ABNORMAL HIGH (ref 135–145)

## 2018-06-11 LAB — URINE CULTURE: Culture: >=100000 — AB

## 2018-06-11 MED ORDER — METOPROLOL TARTRATE 25 MG PO TABS
25.0000 mg | ORAL_TABLET | Freq: Two times a day (BID) | ORAL | Status: DC
  Administered 2018-06-11 – 2018-06-15 (×8): 25 mg via ORAL
  Filled 2018-06-11 (×8): qty 1

## 2018-06-11 MED ORDER — SODIUM CHLORIDE 0.9 % IV BOLUS
500.0000 mL | Freq: Once | INTRAVENOUS | Status: AC
  Administered 2018-06-11: 500 mL via INTRAVENOUS

## 2018-06-11 MED ORDER — METOPROLOL TARTRATE 25 MG PO TABS
12.5000 mg | ORAL_TABLET | Freq: Two times a day (BID) | ORAL | Status: DC
  Administered 2018-06-11: 12.5 mg via ORAL
  Filled 2018-06-11: qty 1

## 2018-06-11 MED ORDER — METRONIDAZOLE IN NACL 5-0.79 MG/ML-% IV SOLN
500.0000 mg | Freq: Three times a day (TID) | INTRAVENOUS | Status: DC
  Administered 2018-06-11 – 2018-06-13 (×5): 500 mg via INTRAVENOUS
  Filled 2018-06-11 (×8): qty 100

## 2018-06-11 MED ORDER — METOPROLOL TARTRATE 5 MG/5ML IV SOLN
5.0000 mg | Freq: Four times a day (QID) | INTRAVENOUS | Status: DC | PRN
  Administered 2018-06-11: 5 mg via INTRAVENOUS
  Filled 2018-06-11: qty 5

## 2018-06-11 MED ORDER — SODIUM CHLORIDE 0.9 % IV SOLN
INTRAVENOUS | Status: DC
  Administered 2018-06-11 (×2): via INTRAVENOUS

## 2018-06-11 MED ORDER — BISACODYL 10 MG RE SUPP
10.0000 mg | Freq: Once | RECTAL | Status: AC
  Administered 2018-06-11: 10 mg via RECTAL
  Filled 2018-06-11: qty 1

## 2018-06-11 MED ORDER — LORAZEPAM 1 MG PO TABS
1.0000 mg | ORAL_TABLET | ORAL | Status: DC | PRN
  Administered 2018-06-11 (×2): 1 mg via ORAL
  Filled 2018-06-11 (×2): qty 1

## 2018-06-11 NOTE — Progress Notes (Signed)
Pt's HR noted to be in the 170s-180s, occasionally over 200. On assessment, patient was red, flushed, and shivering. Per pt's fiance, patient appeared to be having a panic attack. Notified MD Caryn Bee, and per MD, okay to reorder PO Ativan 1mg  Q4hr PRN and place new order for 5mg  of IV metoprolol Q6hr PRN for HR >120. Nursing staff will continue to monitor for any changes in patient status. Lamonte Richer, RN

## 2018-06-11 NOTE — Progress Notes (Signed)
Patient ID: Alexander Lara, male   DOB: [birth year], 72 y.o.   MRN: [id number]  [employer] PROGRESS NOTE  Liban Guedes WUJ:[id number] DOB: 11/28/1945 DOA: 07-08-2018 PCP: [facility]  HPI/Subjective: Patient with his eyes open.  Unable to give much history at this time.  Objective: Vitals:   06/11/18 0449 06/11/18 0747  BP: (!) 159/90 (!) 174/91  Pulse: 93 (!) 107  Resp: 18 18  Temp: 99 F (37.2 C) 98.8 F (37.1 C)  SpO2: 96% 98%    Filed Weights   07-08-18 1948 06/09/18 0206 06/10/18 0338  Weight: 37.5 kg 28.9 kg 29.9 kg    ROS: Review of Systems  Unable to perform ROS: Acuity of condition   Exam: Physical Exam  Constitutional: He appears cachectic.  HENT:  Nose: No mucosal edema.  Mouth/Throat: No oropharyngeal exudate or posterior oropharyngeal edema.  Eyes: Pupils are equal, round, and reactive to light. Conjunctivae and lids are normal.  Neck: No JVD present. Carotid bruit is not present. No edema present. No thyroid mass and no thyromegaly present.  Cardiovascular: S1 normal and S2 normal. An irregularly irregular rhythm present. Tachycardia present. Exam reveals no gallop.  No murmur heard. Pulses:      Dorsalis pedis pulses are 2+ on the right side, and 2+ on the left side.  Respiratory: No respiratory distress. He has no wheezes. He has no rhonchi. He has no rales.  GI: Soft. Bowel sounds are normal. There is no tenderness.  Musculoskeletal:       Right ankle: He exhibits no swelling.       Left ankle: He exhibits no swelling.  Lymphadenopathy:    He has no cervical adenopathy.  Neurological: He is alert.  Skin: Skin is warm. No rash noted. Nails show no clubbing.  Psychiatric:  Alert but not answering questions      Data Reviewed: Basic Metabolic Panel: Recent Labs  Lab 2018-07-08 2045 06/09/18 1737 06/10/18 0432 06/11/18 0600  NA 144  --  145 148*  K 3.8 3.9 3.7 4.2  CL 112*  --  114* 117*  CO2 22  --  21* 22  GLUCOSE 108*  --   103* 98  BUN 38*  --  34* 29*  CREATININE 1.00  --  0.83 0.91  CALCIUM 8.5*  --  8.4* 8.9  MG 1.9 1.8  --   --   PHOS 3.9  --   --   --    Liver Function Tests: Recent Labs  Lab 07-08-18 2045  AST 14*  ALT 11  ALKPHOS 51  BILITOT 0.6  PROT 6.5  ALBUMIN 2.7*   CBC: Recent Labs  Lab 2018-07-08 2045 06/10/18 0432 06/11/18 0600  WBC 11.5* 8.9 6.2  NEUTROABS 9.7*  --   --   HGB 9.9* 8.7* 9.7*  HCT 28.9* 25.4* 28.7*  MCV 77.7* 77.6* 78.4*  PLT 316 274 282   Cardiac Enzymes:   Recent Results (from the past 240 hour(s))  Urine Culture     Status: Abnormal   Collection Time: 07-08-2018  7:51 PM  Result Value Ref Range Status   Specimen Description   Final    URINE, RANDOM Performed at [facility], [address] [place], [place] [address]    Special Requests   Final    NONE Performed at [facility], [address], [place], [place] [address]    Culture >=100,000 COLONIES/mL PROTEUS MIRABILIS (A)  Final   Report Status 06/11/2018 FINAL  Final   Organism ID, Bacteria PROTEUS MIRABILIS (A)  Final      Susceptibility   Proteus mirabilis - MIC*    AMPICILLIN <=2 SENSITIVE Sensitive     CEFAZOLIN <=4 SENSITIVE Sensitive     CEFTRIAXONE <=1 SENSITIVE Sensitive     CIPROFLOXACIN <=0.25 SENSITIVE Sensitive     GENTAMICIN <=1 SENSITIVE Sensitive     IMIPENEM 4 SENSITIVE Sensitive     NITROFURANTOIN 128 RESISTANT Resistant     TRIMETH/SULFA <=20 SENSITIVE Sensitive     AMPICILLIN/SULBACTAM <=2 SENSITIVE Sensitive     PIP/TAZO <=4 SENSITIVE Sensitive     * >=100,000 COLONIES/mL PROTEUS MIRABILIS  Blood Culture ID Panel (Reflexed)     Status: Abnormal   Collection Time: 06/28/2018 10:57 PM  Result Value Ref Range Status   Enterococcus species NOT DETECTED NOT DETECTED Final   Listeria monocytogenes NOT DETECTED NOT DETECTED Final   Staphylococcus species NOT DETECTED NOT DETECTED Final   Staphylococcus aureus NOT DETECTED NOT DETECTED Final    Streptococcus species NOT DETECTED NOT DETECTED Final   Streptococcus agalactiae NOT DETECTED NOT DETECTED Final   Streptococcus pneumoniae NOT DETECTED NOT DETECTED Final   Streptococcus pyogenes NOT DETECTED NOT DETECTED Final   Acinetobacter baumannii NOT DETECTED NOT DETECTED Final   Enterobacteriaceae species DETECTED (A) NOT DETECTED Final    Comment: Enterobacteriaceae represent a large family of gram-negative bacteria, not a single organism. CRITICAL RESULT CALLED TO, READ BACK BY AND VERIFIED WITH: JASON ROBBINS AT 1936 06/10/18.PMH    Enterobacter cloacae complex NOT DETECTED NOT DETECTED Final   Escherichia coli NOT DETECTED NOT DETECTED Final   Klebsiella oxytoca NOT DETECTED NOT DETECTED Final   Klebsiella pneumoniae NOT DETECTED NOT DETECTED Final   Proteus species DETECTED (A) NOT DETECTED Final    Comment: CRITICAL RESULT CALLED TO, READ BACK BY AND VERIFIED WITH: JASON ROBBINS AT 1936 06/10/18.PMH    Serratia marcescens NOT DETECTED NOT DETECTED Final   Carbapenem resistance NOT DETECTED NOT DETECTED Final   Haemophilus influenzae NOT DETECTED NOT DETECTED Final   Neisseria meningitidis NOT DETECTED NOT DETECTED Final   Pseudomonas aeruginosa NOT DETECTED NOT DETECTED Final   Candida albicans NOT DETECTED NOT DETECTED Final   Candida glabrata NOT DETECTED NOT DETECTED Final   Candida krusei NOT DETECTED NOT DETECTED Final   Candida parapsilosis NOT DETECTED NOT DETECTED Final   Candida tropicalis NOT DETECTED NOT DETECTED Final    Comment: Performed at [facility], [address] [place], [place] [address]  Blood culture (routine x 2)     Status: Abnormal (Preliminary result)   Collection Time: 06/28/18 11:40 PM  Result Value Ref Range Status   Specimen Description   Final    BLOOD LEFT ANTECUBITAL Performed at [facility], [address], [place], [place] [address]    Special Requests   Final    BOTTLES DRAWN AEROBIC AND ANAEROBIC Blood  Culture results may not be optimal due to an excessive volume of blood received in culture bottles Performed at [facility], [address] [place], [place] [address]    Culture  Setup Time   Final    GRAM POSITIVE RODS ANAEROBIC BOTTLE ONLY CRITICAL VALUE NOTED.  VALUE IS CONSISTENT WITH PREVIOUSLY REPORTED AND CALLED VALUE. GRAM NEGATIVE RODS AEROBIC BOTTLE ONLY CRITICAL RESULT CALLED TO, READ BACK BY AND VERIFIED WITH: JASON ROBBINS AT 1936 06/10/18.PMH Performed at [facility], [address] [place], [place] [address]    Culture PROTEUS  MIRABILIS CLOSTRIDIUM SPECIES  (A)  Final   Report Status PENDING  Incomplete  Blood culture (routine x 2)     Status: Abnormal (Preliminary result)   Collection Time: 06/13/2018 11:40 PM  Result Value Ref Range Status   Specimen Description   Final    BLOOD RIGHT ANTECUBITAL Performed at [facility], [address], [place], [place] [address]    Special Requests   Final    BOTTLES DRAWN AEROBIC AND ANAEROBIC Blood Culture results may not be optimal due to an excessive volume of blood received in culture bottles Performed at [facility], [address], [place], [place] [address]    Culture  Setup Time   Final    GRAM POSITIVE RODS IN BOTH AEROBIC AND ANAEROBIC BOTTLES CRITICAL RESULT CALLED TO, READ BACK BY AND VERIFIED WITH: HANK ZOMPA 06/09/18 1142 REC Performed at [facility], [address] [place], [place] [address]    Culture PROTEUS MIRABILIS CLOSTRIDIUM SPECIES  (A)  Final   Report Status PENDING  Incomplete  Blood Culture ID Panel (Reflexed)     Status: None   Collection Time: 06/09/2018 11:40 PM  Result Value Ref Range Status   Enterococcus species NOT DETECTED NOT DETECTED Final   Listeria monocytogenes NOT DETECTED NOT DETECTED Final   Staphylococcus species NOT DETECTED NOT DETECTED Final   Staphylococcus aureus NOT DETECTED NOT DETECTED Final   Streptococcus species NOT  DETECTED NOT DETECTED Final   Streptococcus agalactiae NOT DETECTED NOT DETECTED Final   Streptococcus pneumoniae NOT DETECTED NOT DETECTED Final   Streptococcus pyogenes NOT DETECTED NOT DETECTED Final   Acinetobacter baumannii NOT DETECTED NOT DETECTED Final   Enterobacteriaceae species NOT DETECTED NOT DETECTED Final   Enterobacter cloacae complex NOT DETECTED NOT DETECTED Final   Escherichia coli NOT DETECTED NOT DETECTED Final   Klebsiella oxytoca NOT DETECTED NOT DETECTED Final   Klebsiella pneumoniae NOT DETECTED NOT DETECTED Final   Proteus species NOT DETECTED NOT DETECTED Final   Serratia marcescens NOT DETECTED NOT DETECTED Final   Haemophilus influenzae NOT DETECTED NOT DETECTED Final   Neisseria meningitidis NOT DETECTED NOT DETECTED Final   Pseudomonas aeruginosa NOT DETECTED NOT DETECTED Final   Candida albicans NOT DETECTED NOT DETECTED Final   Candida glabrata NOT DETECTED NOT DETECTED Final   Candida krusei NOT DETECTED NOT DETECTED Final   Candida parapsilosis NOT DETECTED NOT DETECTED Final   Candida tropicalis NOT DETECTED NOT DETECTED Final    Comment: Performed at [facility], [address] [place], [place] [address]     Studies: No results found.  Scheduled Meds: . feeding supplement (NEPRO CARB STEADY)  237 mL Oral TID BM  . levETIRAcetam  500 mg Oral BID  . Melatonin  5 mg Oral QHS  . metoprolol tartrate  25 mg Oral BID  . multivitamin with minerals  1 tablet Oral Daily  . sodium chloride flush  3 mL Intravenous Q12H  . tamsulosin  0.4 mg Oral QPC breakfast   Continuous Infusions: . sodium chloride    . cefTRIAXone (ROCEPHIN)  IV Stopped (06/10/18 2302)  . sodium chloride 500 mL (06/11/18 1330)    Assessment/Plan:   1. Proteus sepsis from indwelling Foley catheter.  Blood cultures and urine cultures positive.  Continue Rocephin.  Please switch over to Levaquin upon discharge back to facility. 2. Atrial fibrillation with rapid  ventricular response.  Started on IV fluid bolus and oral metoprolol.  Not a good candidate for  anticoagulation. 3. History of seizures on Keppra 4. Poor urine output give IV fluid bolus and IV fluid hydration 5. Failure to thrive, cachexia and malnutrition.  Overall prognosis is poor.  Patient is a DNR.  Fianc refused patient to go to hospice home yesterday.  Patient is not a good candidate at this point to go back to [facility].  Overall prognosis is poor. 6. BPH on Flomax  Code Status:     Code Status Orders  (From admission, onward)         Start     Ordered   06/10/18 1120  Do not attempt resuscitation (DNR)  Continuous    Question Answer Comment  In the event of cardiac or respiratory ARREST Do not call a "code blue"   In the event of cardiac or respiratory ARREST Do not perform Intubation, CPR, defibrillation or ACLS   In the event of cardiac or respiratory ARREST Use medication by any route, position, wound care, and other measures to relive pain and suffering. May use oxygen, suction and manual treatment of airway obstruction as needed for comfort.      06/10/18 1121        Code Status History    Date Active Date Inactive Code Status Order ID Comments User Context   06/09/2018 0209 06/10/2018 1121 DNR [id number]  Barbaraann Rondo, MD ED   05/15/2018 1120 05/18/2018 2303 Full Code [id number]  Auburn Bilberry, MD Inpatient   05/15/2018 0125 05/15/2018 1120 Full Code [id number]  Cammy Copa, MD Inpatient    Advance Directive Documentation     Most Recent Value  Type of Advance Directive  Healthcare Power of Attorney  Pre-existing out of facility DNR order (yellow form or pink MOST form)  -  "MOST" Form in Place?  -     Family Communication: Spoke with fianc at the bedside Disposition Plan: Back to [facility] once vital signs a little more stable  Antibiotics:  Rocephin  Time spent: 30 minutes including ACP time  [employer]

## 2018-06-11 NOTE — Progress Notes (Signed)
Patient ID: Alexander Lara, male   DOB: Apr 07, 1946, 72 y.o.   MRN: [id number]  ACP note  Patient unable to participate in conversation Spoke with fianc at the bedside  Diagnosis Proteus sepsis from indwelling Foley catheter, atrial fibrillation with rapid ventricular response, history of seizure, poor urine output, failure to thrive, cachexia and malnutrition, BPH  Patient is a DNR.  Plan.  They changed her mind and not going to the hospice home yesterday.  If I sent the patient back to [facility] today they would just send him right back to the facility.  Need to control his atrial fibrillation better with metoprolol.  Continue IV antibiotics for now.  Hopefully can flip over to oral Levaquin upon discharge out of the hospital.  I think regardless of what we do his overall prognosis is not good.  Time spent on ACP discussion 17 minutes Dr. Alford Highland

## 2018-06-11 NOTE — Progress Notes (Addendum)
Only 75 ml of urine this shift.  Attempt to flush existing foley unsuccessful.  Foley removed. Attempt to place a 9F foley unsuccessful.  Contacted ER that we need someonr to place a Cudea.    Bladder scan = 548 ml.

## 2018-06-11 NOTE — Progress Notes (Signed)
Patient's HR now sustaining in the 80s-110s and resting comfortably in bed. Nursing staff will continue to monitor for any changes in patient status. Lamonte Richer, RN

## 2018-06-11 NOTE — Progress Notes (Signed)
78F caudet cath inserted with small amt resistance, urine draining, pt tol well.

## 2018-06-12 LAB — BASIC METABOLIC PANEL
ANION GAP: 8 (ref 5–15)
BUN: 29 mg/dL — ABNORMAL HIGH (ref 8–23)
CO2: 21 mmol/L — AB (ref 22–32)
Calcium: 8.2 mg/dL — ABNORMAL LOW (ref 8.9–10.3)
Chloride: 119 mmol/L — ABNORMAL HIGH (ref 98–111)
Creatinine, Ser: 0.68 mg/dL (ref 0.61–1.24)
GFR calc Af Amer: >60 mL/min (ref >60)
GFR calc non Af Amer: >60 mL/min (ref >60)
GLUCOSE: 102 mg/dL — AB (ref 70–99)
POTASSIUM: 3.5 mmol/L (ref 3.5–5.1)
Sodium: 148 mmol/L — ABNORMAL HIGH (ref 135–145)

## 2018-06-12 LAB — CBC
HEMATOCRIT: 23.1 % — AB (ref 40.0–52.0)
HEMOGLOBIN: 7.9 g/dL — AB (ref 13.0–18.0)
MCH: 26.7 pg (ref 26.0–34.0)
MCHC: 34.2 g/dL (ref 32.0–36.0)
MCV: 78.2 fL — ABNORMAL LOW (ref 80.0–100.0)
Platelets: 254 10*3/uL (ref 150–440)
RBC: 2.96 MIL/uL — ABNORMAL LOW (ref 4.40–5.90)
RDW: 25.6 % — ABNORMAL HIGH (ref 11.5–14.5)
WBC: 6.8 10*3/uL (ref 3.8–10.6)

## 2018-06-12 LAB — FERRITIN: Ferritin: 312 ng/mL (ref 24–336)

## 2018-06-12 LAB — TSH: TSH: 3.47 u[IU]/mL (ref 0.350–4.500)

## 2018-06-12 MED ORDER — ORAL CARE MOUTH RINSE
15.0000 mL | Freq: Two times a day (BID) | OROMUCOSAL | Status: DC
  Administered 2018-06-12 – 2018-06-22 (×14): 15 mL via OROMUCOSAL

## 2018-06-12 MED ORDER — POTASSIUM CHLORIDE CRYS ER 20 MEQ PO TBCR
40.0000 meq | EXTENDED_RELEASE_TABLET | Freq: Once | ORAL | Status: AC
  Administered 2018-06-12: 40 meq via ORAL
  Filled 2018-06-12: qty 2

## 2018-06-12 NOTE — Plan of Care (Signed)
  Problem: Coping: Goal: Level of anxiety will decrease Outcome: Progressing   Problem: Pain Managment: Goal: General experience of comfort will improve Outcome: Progressing   Problem: Safety: Goal: Ability to remain free from injury will improve Outcome: Progressing   Problem: Skin Integrity: Goal: Risk for impaired skin integrity will decrease Outcome: Progressing   Problem: Urinary Elimination: Goal: Signs and symptoms of infection will decrease Outcome: Progressing   Problem: Activity: Goal: Risk for activity intolerance will decrease Outcome: Not Progressing Pt. Is bed bound and contracting slightly at the lower extremities.   Problem: Nutrition: Goal: Adequate nutrition will be maintained Outcome: Not Progressing

## 2018-06-12 NOTE — Progress Notes (Signed)
Patient ID: Alexander Lara, male   DOB: November 07, 1945, 72 y.o.   MRN: [id number]  [employer] [facility] PROGRESS NOTE  Alexander Lara WUJ:[id number] DOB: 1946/08/10 DOA: June 18, 2018 PCP: [facility]  HPI/Subjective: Patient able to answer few questions today.  Objective: Vitals:   06/12/18 0449 06/12/18 0824  BP: 118/80 121/76  Pulse: 85 87  Resp: 18 19  Temp: 98 F (36.7 C) 98.2 F (36.8 C)  SpO2: 98% 95%    Filed Weights   18-Jun-2018 1948 06/09/18 0206 06/10/18 0338  Weight: 37.5 kg 28.9 kg 29.9 kg    ROS: Review of Systems  Unable to perform ROS: Acuity of condition  Respiratory: Negative for shortness of breath.   Cardiovascular: Negative for chest pain.  Gastrointestinal: Negative for abdominal pain.   Exam: Physical Exam  Constitutional: He appears cachectic.  HENT:  Nose: No mucosal edema.  Mouth/Throat: No oropharyngeal exudate or posterior oropharyngeal edema.  Eyes: Pupils are equal, round, and reactive to light. Conjunctivae and lids are normal.  Neck: No JVD present. Carotid bruit is not present. No edema present. No thyroid mass and no thyromegaly present.  Cardiovascular: S1 normal and S2 normal. An irregularly irregular rhythm present. Exam reveals no gallop.  No murmur heard. Pulses:      Dorsalis pedis pulses are 2+ on the right side, and 2+ on the left side.  Respiratory: No respiratory distress. He has no wheezes. He has no rhonchi. He has no rales.  GI: Soft. Bowel sounds are normal. There is no tenderness.  Musculoskeletal:       Right ankle: He exhibits no swelling.       Left ankle: He exhibits no swelling.  Lymphadenopathy:    He has no cervical adenopathy.  Neurological: He is alert.  Skin: Skin is warm. No rash noted. Nails show no clubbing.  Psychiatric:  Alert but not answering questions      Data Reviewed: Basic Metabolic Panel: Recent Labs  Lab Jun 18, 2018 2045 06/09/18 1737 06/10/18 0432 06/11/18 0600 06/12/18 0456  NA 144  --   145 148* 148*  K 3.8 3.9 3.7 4.2 3.5  CL 112*  --  114* 117* 119*  CO2 22  --  21* 22 21*  GLUCOSE 108*  --  103* 98 102*  BUN 38*  --  34* 29* 29*  CREATININE 1.00  --  0.83 0.91 0.68  CALCIUM 8.5*  --  8.4* 8.9 8.2*  MG 1.9 1.8  --   --   --   PHOS 3.9  --   --   --   --    Liver Function Tests: Recent Labs  Lab 06/18/18 2045  AST 14*  ALT 11  ALKPHOS 51  BILITOT 0.6  PROT 6.5  ALBUMIN 2.7*   CBC: Recent Labs  Lab Jun 18, 2018 2045 06/10/18 0432 06/11/18 0600 06/12/18 0456  WBC 11.5* 8.9 6.2 6.8  NEUTROABS 9.7*  --   --   --   HGB 9.9* 8.7* 9.7* 7.9*  HCT 28.9* 25.4* 28.7* 23.1*  MCV 77.7* 77.6* 78.4* 78.2*  PLT 316 274 282 254   Cardiac Enzymes:   Recent Results (from the past 240 hour(s))  Urine Culture     Status: Abnormal   Collection Time: 06/18/18  7:51 PM  Result Value Ref Range Status   Specimen Description   Final    URINE, RANDOM Performed at [facility], [address] [place], [place] [address]    Special Requests  Final    NONE Performed at [facility], [address] [place], [place] [address]    Culture >=100,000 COLONIES/mL PROTEUS MIRABILIS (A)  Final   Report Status 06/11/2018 FINAL  Final   Organism ID, Bacteria PROTEUS MIRABILIS (A)  Final      Susceptibility   Proteus mirabilis - MIC*    AMPICILLIN <=2 SENSITIVE Sensitive     CEFAZOLIN <=4 SENSITIVE Sensitive     CEFTRIAXONE <=1 SENSITIVE Sensitive     CIPROFLOXACIN <=0.25 SENSITIVE Sensitive     GENTAMICIN <=1 SENSITIVE Sensitive     IMIPENEM 4 SENSITIVE Sensitive     NITROFURANTOIN 128 RESISTANT Resistant     TRIMETH/SULFA <=20 SENSITIVE Sensitive     AMPICILLIN/SULBACTAM <=2 SENSITIVE Sensitive     PIP/TAZO <=4 SENSITIVE Sensitive     * >=100,000 COLONIES/mL PROTEUS MIRABILIS  Blood Culture ID Panel (Reflexed)     Status: Abnormal   Collection Time: 05/26/2018 10:57 PM  Result Value Ref Range Status   Enterococcus species NOT DETECTED NOT DETECTED  Final   Listeria monocytogenes NOT DETECTED NOT DETECTED Final   Staphylococcus species NOT DETECTED NOT DETECTED Final   Staphylococcus aureus NOT DETECTED NOT DETECTED Final   Streptococcus species NOT DETECTED NOT DETECTED Final   Streptococcus agalactiae NOT DETECTED NOT DETECTED Final   Streptococcus pneumoniae NOT DETECTED NOT DETECTED Final   Streptococcus pyogenes NOT DETECTED NOT DETECTED Final   Acinetobacter baumannii NOT DETECTED NOT DETECTED Final   Enterobacteriaceae species DETECTED (A) NOT DETECTED Final    Comment: Enterobacteriaceae represent a large family of gram-negative bacteria, not a single organism. CRITICAL RESULT CALLED TO, READ BACK BY AND VERIFIED WITH: JASON ROBBINS AT 1936 06/10/18.PMH    Enterobacter cloacae complex NOT DETECTED NOT DETECTED Final   Escherichia coli NOT DETECTED NOT DETECTED Final   Klebsiella oxytoca NOT DETECTED NOT DETECTED Final   Klebsiella pneumoniae NOT DETECTED NOT DETECTED Final   Proteus species DETECTED (A) NOT DETECTED Final    Comment: CRITICAL RESULT CALLED TO, READ BACK BY AND VERIFIED WITH: JASON ROBBINS AT 1936 06/10/18.PMH    Serratia marcescens NOT DETECTED NOT DETECTED Final   Carbapenem resistance NOT DETECTED NOT DETECTED Final   Haemophilus influenzae NOT DETECTED NOT DETECTED Final   Neisseria meningitidis NOT DETECTED NOT DETECTED Final   Pseudomonas aeruginosa NOT DETECTED NOT DETECTED Final   Candida albicans NOT DETECTED NOT DETECTED Final   Candida glabrata NOT DETECTED NOT DETECTED Final   Candida krusei NOT DETECTED NOT DETECTED Final   Candida parapsilosis NOT DETECTED NOT DETECTED Final   Candida tropicalis NOT DETECTED NOT DETECTED Final    Comment: Performed at [facility], [address] [place], [place] [address]  Blood culture (routine x 2)     Status: Abnormal (Preliminary result)   Collection Time: 05/15/2018 11:40 PM  Result Value Ref Range Status   Specimen Description   Final     BLOOD LEFT ANTECUBITAL Performed at [facility], [address], [place], [place] [address]    Special Requests   Final    BOTTLES DRAWN AEROBIC AND ANAEROBIC Blood Culture results may not be optimal due to an excessive volume of blood received in culture bottles Performed at [facility], [address], [place], [place] [address]    Culture  Setup Time   Final    GRAM POSITIVE RODS ANAEROBIC BOTTLE ONLY CRITICAL VALUE NOTED.  VALUE IS CONSISTENT WITH PREVIOUSLY REPORTED AND CALLED VALUE. GRAM NEGATIVE RODS  AEROBIC BOTTLE ONLY CRITICAL RESULT CALLED TO, READ BACK BY AND VERIFIED WITH: JASON ROBBINS AT 1936 06/10/18.PMH Performed at [facility], [address] [place], [place] [address]    Culture (A)  Final    PROTEUS MIRABILIS SUSCEPTIBILITIES PERFORMED ON PREVIOUS CULTURE WITHIN THE LAST 5 DAYS. CLOSTRIDIUM SPECIES    Report Status PENDING  Incomplete  Blood culture (routine x 2)     Status: Abnormal (Preliminary result)   Collection Time: 06/13/2018 11:40 PM  Result Value Ref Range Status   Specimen Description BLOOD RIGHT ANTECUBITAL  Final   Special Requests   Final    BOTTLES DRAWN AEROBIC AND ANAEROBIC Blood Culture results may not be optimal due to an excessive volume of blood received in culture bottles   Culture  Setup Time   Final    GRAM POSITIVE RODS IN BOTH AEROBIC AND ANAEROBIC BOTTLES CRITICAL RESULT CALLED TO, READ BACK BY AND VERIFIED WITH: HANK ZOMPA 06/09/18 1142 REC    Culture PROTEUS MIRABILIS CLOSTRIDIUM SPECIES  (A)  Final   Report Status PENDING  Incomplete   Organism ID, Bacteria PROTEUS MIRABILIS  Final      Susceptibility   Proteus mirabilis - MIC*    AMPICILLIN <=2 SENSITIVE Sensitive     CEFAZOLIN <=4 SENSITIVE Sensitive     CEFEPIME <=1 SENSITIVE Sensitive     CEFTAZIDIME <=1 SENSITIVE Sensitive     CEFTRIAXONE <=1 SENSITIVE Sensitive     CIPROFLOXACIN <=0.25 SENSITIVE Sensitive     GENTAMICIN <=1 SENSITIVE  Sensitive     IMIPENEM 2 SENSITIVE Sensitive     TRIMETH/SULFA <=20 SENSITIVE Sensitive     AMPICILLIN/SULBACTAM <=2 SENSITIVE Sensitive     PIP/TAZO Value in next row Sensitive      <=4 SENSITIVEPerformed at [facility], [address] [place], [place] [address]    * PROTEUS MIRABILIS  Blood Culture ID Panel (Reflexed)     Status: None   Collection Time: 05/21/2018 11:40 PM  Result Value Ref Range Status   Enterococcus species NOT DETECTED NOT DETECTED Final   Listeria monocytogenes NOT DETECTED NOT DETECTED Final   Staphylococcus species NOT DETECTED NOT DETECTED Final   Staphylococcus aureus NOT DETECTED NOT DETECTED Final   Streptococcus species NOT DETECTED NOT DETECTED Final   Streptococcus agalactiae NOT DETECTED NOT DETECTED Final   Streptococcus pneumoniae NOT DETECTED NOT DETECTED Final   Streptococcus pyogenes NOT DETECTED NOT DETECTED Final   Acinetobacter baumannii NOT DETECTED NOT DETECTED Final   Enterobacteriaceae species NOT DETECTED NOT DETECTED Final   Enterobacter cloacae complex NOT DETECTED NOT DETECTED Final   Escherichia coli NOT DETECTED NOT DETECTED Final   Klebsiella oxytoca NOT DETECTED NOT DETECTED Final   Klebsiella pneumoniae NOT DETECTED NOT DETECTED Final   Proteus species NOT DETECTED NOT DETECTED Final   Serratia marcescens NOT DETECTED NOT DETECTED Final   Haemophilus influenzae NOT DETECTED NOT DETECTED Final   Neisseria meningitidis NOT DETECTED NOT DETECTED Final   Pseudomonas aeruginosa NOT DETECTED NOT DETECTED Final   Candida albicans NOT DETECTED NOT DETECTED Final   Candida glabrata NOT DETECTED NOT DETECTED Final   Candida krusei NOT DETECTED NOT DETECTED Final   Candida parapsilosis NOT DETECTED NOT DETECTED Final   Candida tropicalis NOT DETECTED NOT DETECTED Final    Comment: Performed at [facility], [address] [place], [place] [address]     Studies: No results found.  Scheduled Meds: . feeding supplement  (NEPRO CARB STEADY)  237  mL Oral TID BM  . levETIRAcetam  500 mg Oral BID  . mouth rinse  15 mL Mouth Rinse BID  . Melatonin  5 mg Oral QHS  . metoprolol tartrate  25 mg Oral BID  . multivitamin with minerals  1 tablet Oral Daily  . sodium chloride flush  3 mL Intravenous Q12H  . tamsulosin  0.4 mg Oral QPC breakfast   Continuous Infusions: . cefTRIAXone (ROCEPHIN)  IV Stopped (06/11/18 2135)  . metronidazole 500 mg (06/12/18 0921)    Assessment/Plan:   1. Proteus sepsis from indwelling Foley catheter.  Blood cultures and urine cultures positive.  Continue Rocephin.  Foley catheter changed yesterday.  This will have to be changed every 4 weeks. 2. Clostridium species also and blood culture.  On IV Flagyl for now. 3. Atrial fibrillation with rapid ventricular response.  Continue oral metoprolol not a good candidate for anticoagulation. 4. History of seizures on Keppra 5. Drop in hemoglobin.  Discontinue IV fluids.  Guaiac stools.  Check hemoglobin tomorrow morning 6. Failure to thrive, cachexia and malnutrition.  Overall prognosis is poor.  Patient is a DNR.  Fianc refused patient to go to hospice home yesterday.  Patient is not a good candidate at this point to go back to [facility].  Overall prognosis is poor. 7. BPH on Flomax  Code Status:     Code Status Orders  (From admission, onward)         Start     Ordered   06/10/18 1120  Do not attempt resuscitation (DNR)  Continuous    Question Answer Comment  In the event of cardiac or respiratory ARREST Do not call a "code blue"   In the event of cardiac or respiratory ARREST Do not perform Intubation, CPR, defibrillation or ACLS   In the event of cardiac or respiratory ARREST Use medication by any route, position, wound care, and other measures to relive pain and suffering. May use oxygen, suction and manual treatment of airway obstruction as needed for comfort.      06/10/18 1121        Code Status History    Date  Active Date Inactive Code Status Order ID Comments User Context   06/09/2018 0209 06/10/2018 1121 DNR [id number]  Barbaraann Rondo, MD ED   05/15/2018 1120 05/18/2018 2303 Full Code [id number]  Auburn Bilberry, MD Inpatient   05/15/2018 0125 05/15/2018 1120 Full Code [id number]  Cammy Copa, MD Inpatient    Advance Directive Documentation     Most Recent Value  Type of Advance Directive  Healthcare Power of Attorney  Pre-existing out of facility DNR order (yellow form or pink MOST form)  -  "MOST" Form in Place?  -     Disposition Plan: Back to [facility] potentially tomorrow  Antibiotics:  Rocephin  Flagyl  Time spent: 28 minutes  Stachia Slutsky [employer]

## 2018-06-13 ENCOUNTER — Inpatient Hospital Stay: Payer: Self-pay

## 2018-06-13 ENCOUNTER — Inpatient Hospital Stay: Payer: Medicare Other

## 2018-06-13 ENCOUNTER — Encounter: Payer: Self-pay | Admitting: Radiology

## 2018-06-13 DIAGNOSIS — A419 Sepsis, unspecified organism: Secondary | ICD-10-CM

## 2018-06-13 DIAGNOSIS — R627 Adult failure to thrive: Secondary | ICD-10-CM

## 2018-06-13 LAB — CULTURE, BLOOD (ROUTINE X 2)

## 2018-06-13 LAB — BASIC METABOLIC PANEL
Anion gap: 7 (ref 5–15)
BUN: 27 mg/dL — ABNORMAL HIGH (ref 8–23)
CALCIUM: 8.4 mg/dL — AB (ref 8.9–10.3)
CO2: 22 mmol/L (ref 22–32)
CREATININE: 0.85 mg/dL (ref 0.61–1.24)
Chloride: 117 mmol/L — ABNORMAL HIGH (ref 98–111)
GFR calc non Af Amer: >60 mL/min (ref >60)
Glucose, Bld: 109 mg/dL — ABNORMAL HIGH (ref 70–99)
Potassium: 3.8 mmol/L (ref 3.5–5.1)
Sodium: 146 mmol/L — ABNORMAL HIGH (ref 135–145)

## 2018-06-13 LAB — HEMOGLOBIN: Hemoglobin: 9.4 g/dL — ABNORMAL LOW (ref 13.0–18.0)

## 2018-06-13 MED ORDER — SODIUM CHLORIDE 0.9 % IV SOLN
INTRAVENOUS | Status: DC | PRN
  Administered 2018-06-13: 1000 mL via INTRAVENOUS
  Administered 2018-06-14 – 2018-06-16 (×5): 500 mL via INTRAVENOUS
  Administered 2018-06-18: 08:00:00 via INTRAVENOUS
  Administered 2018-06-19 – 2018-06-20 (×2): 500 mL via INTRAVENOUS
  Administered 2018-06-24 – 2018-06-26 (×2): 250 mL via INTRAVENOUS

## 2018-06-13 MED ORDER — SODIUM CHLORIDE 0.9% FLUSH
10.0000 mL | Freq: Two times a day (BID) | INTRAVENOUS | Status: DC
  Administered 2018-06-13 – 2018-06-26 (×21): 10 mL
  Administered 2018-06-27: 30 mL
  Administered 2018-06-27 – 2018-07-06 (×15): 10 mL

## 2018-06-13 MED ORDER — IOHEXOL 300 MG/ML  SOLN
50.0000 mL | Freq: Once | INTRAMUSCULAR | Status: AC | PRN
  Administered 2018-06-13: 50 mL via INTRAVENOUS

## 2018-06-13 MED ORDER — SODIUM CHLORIDE 0.9 % IV SOLN
3.0000 g | Freq: Four times a day (QID) | INTRAVENOUS | Status: DC
  Administered 2018-06-13 – 2018-06-20 (×28): 3 g via INTRAVENOUS
  Filled 2018-06-13 (×35): qty 3

## 2018-06-13 MED ORDER — SODIUM CHLORIDE 0.9% FLUSH
10.0000 mL | INTRAVENOUS | Status: DC | PRN
  Administered 2018-06-20 – 2018-06-24 (×2): 10 mL
  Filled 2018-06-13 (×2): qty 40

## 2018-06-13 NOTE — Progress Notes (Signed)
Patient ID: Monique Gift, male   DOB: Jul 15, 1946, 72 y.o.   MRN: [id number]  [facility] [employer] PROGRESS NOTE  Cristhian Vanhook WGN:[id number] DOB: 01-14-1946 DOA: 06/07/2018 PCP: [facility]  HPI/Subjective: Patient able to look at me today but did not answer questions today.  Tries to follow some simple commands.  Objective: Vitals:   06/13/18 0437 06/13/18 0803  BP: (!) 156/90 139/79  Pulse: 98 84  Resp: 18 18  Temp: 98 F (36.7 C) 98.4 F (36.9 C)  SpO2: 100% 99%    Filed Weights   06/11/2018 1948 06/09/18 0206 06/10/18 0338  Weight: 37.5 kg 28.9 kg 29.9 kg    ROS: Review of Systems  Unable to perform ROS: Acuity of condition   Exam: Physical Exam  Constitutional: He appears cachectic.  HENT:  Nose: No mucosal edema.  Mouth/Throat: No oropharyngeal exudate or posterior oropharyngeal edema.  Eyes: Pupils are equal, round, and reactive to light. Conjunctivae and lids are normal.  Neck: No JVD present. Carotid bruit is not present. No edema present. No thyroid mass and no thyromegaly present.  Cardiovascular: S1 normal and S2 normal. An irregularly irregular rhythm present. Exam reveals no gallop.  No murmur heard. Pulses:      Dorsalis pedis pulses are 2+ on the right side, and 2+ on the left side.  Respiratory: No respiratory distress. He has no wheezes. He has no rhonchi. He has no rales.  GI: Soft. Bowel sounds are normal. There is no tenderness.  Musculoskeletal:       Right ankle: He exhibits no swelling.       Left ankle: He exhibits no swelling.  Lymphadenopathy:    He has no cervical adenopathy.  Neurological: He is alert.  Skin: Skin is warm. No rash noted. Nails show no clubbing.  Psychiatric:  Alert but not answering questions      Data Reviewed: Basic Metabolic Panel: Recent Labs  Lab 05/30/2018 2045 06/09/18 1737 06/10/18 0432 06/11/18 0600 06/12/18 0456 06/13/18 1057  NA 144  --  145 148* 148* 146*  K 3.8 3.9 3.7 4.2 3.5 3.8  CL 112*   --  114* 117* 119* 117*  CO2 22  --  21* 22 21* 22  GLUCOSE 108*  --  103* 98 102* 109*  BUN 38*  --  34* 29* 29* 27*  CREATININE 1.00  --  0.83 0.91 0.68 0.85  CALCIUM 8.5*  --  8.4* 8.9 8.2* 8.4*  MG 1.9 1.8  --   --   --   --   PHOS 3.9  --   --   --   --   --    Liver Function Tests: Recent Labs  Lab 06/13/2018 2045  AST 14*  ALT 11  ALKPHOS 51  BILITOT 0.6  PROT 6.5  ALBUMIN 2.7*   CBC: Recent Labs  Lab 06/03/2018 2045 06/10/18 0432 06/11/18 0600 06/12/18 0456 06/13/18 1057  WBC 11.5* 8.9 6.2 6.8  --   NEUTROABS 9.7*  --   --   --   --   HGB 9.9* 8.7* 9.7* 7.9* 9.4*  HCT 28.9* 25.4* 28.7* 23.1*  --   MCV 77.7* 77.6* 78.4* 78.2*  --   PLT 316 274 282 254  --    Cardiac Enzymes:   Recent Results (from the past 240 hour(s))  Urine Culture     Status: Abnormal   Collection Time: 06/06/2018  7:51 PM  Result Value Ref Range Status   Specimen  Description   Final    URINE, RANDOM Performed at [facility], [address] [place], [place] [address]    Special Requests   Final    NONE Performed at [facility], [address] [place], [place] [address]    Culture >=100,000 COLONIES/mL PROTEUS MIRABILIS (A)  Final   Report Status 06/11/2018 FINAL  Final   Organism ID, Bacteria PROTEUS MIRABILIS (A)  Final      Susceptibility   Proteus mirabilis - MIC*    AMPICILLIN <=2 SENSITIVE Sensitive     CEFAZOLIN <=4 SENSITIVE Sensitive     CEFTRIAXONE <=1 SENSITIVE Sensitive     CIPROFLOXACIN <=0.25 SENSITIVE Sensitive     GENTAMICIN <=1 SENSITIVE Sensitive     IMIPENEM 4 SENSITIVE Sensitive     NITROFURANTOIN 128 RESISTANT Resistant     TRIMETH/SULFA <=20 SENSITIVE Sensitive     AMPICILLIN/SULBACTAM <=2 SENSITIVE Sensitive     PIP/TAZO <=4 SENSITIVE Sensitive     * >=100,000 COLONIES/mL PROTEUS MIRABILIS  Blood Culture ID Panel (Reflexed)     Status: Abnormal   Collection Time: 05/22/2018 10:57 PM  Result Value Ref Range Status   Enterococcus  species NOT DETECTED NOT DETECTED Final   Listeria monocytogenes NOT DETECTED NOT DETECTED Final   Staphylococcus species NOT DETECTED NOT DETECTED Final   Staphylococcus aureus NOT DETECTED NOT DETECTED Final   Streptococcus species NOT DETECTED NOT DETECTED Final   Streptococcus agalactiae NOT DETECTED NOT DETECTED Final   Streptococcus pneumoniae NOT DETECTED NOT DETECTED Final   Streptococcus pyogenes NOT DETECTED NOT DETECTED Final   Acinetobacter baumannii NOT DETECTED NOT DETECTED Final   Enterobacteriaceae species DETECTED (A) NOT DETECTED Final    Comment: Enterobacteriaceae represent a large family of gram-negative bacteria, not a single organism. CRITICAL RESULT CALLED TO, READ BACK BY AND VERIFIED WITH: JASON ROBBINS AT 1936 06/10/18.PMH    Enterobacter cloacae complex NOT DETECTED NOT DETECTED Final   Escherichia coli NOT DETECTED NOT DETECTED Final   Klebsiella oxytoca NOT DETECTED NOT DETECTED Final   Klebsiella pneumoniae NOT DETECTED NOT DETECTED Final   Proteus species DETECTED (A) NOT DETECTED Final    Comment: CRITICAL RESULT CALLED TO, READ BACK BY AND VERIFIED WITH: JASON ROBBINS AT 1936 06/10/18.PMH    Serratia marcescens NOT DETECTED NOT DETECTED Final   Carbapenem resistance NOT DETECTED NOT DETECTED Final   Haemophilus influenzae NOT DETECTED NOT DETECTED Final   Neisseria meningitidis NOT DETECTED NOT DETECTED Final   Pseudomonas aeruginosa NOT DETECTED NOT DETECTED Final   Candida albicans NOT DETECTED NOT DETECTED Final   Candida glabrata NOT DETECTED NOT DETECTED Final   Candida krusei NOT DETECTED NOT DETECTED Final   Candida parapsilosis NOT DETECTED NOT DETECTED Final   Candida tropicalis NOT DETECTED NOT DETECTED Final    Comment: Performed at [facility], [address] [place], [place] [address]  Blood culture (routine x 2)     Status: Abnormal   Collection Time: 05/27/2018 11:40 PM  Result Value Ref Range Status   Specimen Description    Final    BLOOD LEFT ANTECUBITAL Performed at [facility], [address] [place], [place] [address]    Special Requests   Final    BOTTLES DRAWN AEROBIC AND ANAEROBIC Blood Culture results may not be optimal due to an excessive volume of blood received in culture bottles Performed at [facility], [address], [place], [place] [address]    Culture  Setup Time  Final    GRAM POSITIVE RODS ANAEROBIC BOTTLE ONLY CRITICAL VALUE NOTED.  VALUE IS CONSISTENT WITH PREVIOUSLY REPORTED AND CALLED VALUE. GRAM NEGATIVE RODS AEROBIC BOTTLE ONLY CRITICAL RESULT CALLED TO, READ BACK BY AND VERIFIED WITH: JASON ROBBINS AT 1936 06/10/18.PMH Performed at [name], [address] [place], [place] [address]    Culture (A)  Final    PROTEUS MIRABILIS SUSCEPTIBILITIES PERFORMED ON PREVIOUS CULTURE WITHIN THE LAST 5 DAYS. CLOSTRIDIUM SPECIES Standardized susceptibility testing for this organism is not available. Performed at [facility], [address] [place], [place] [address]    Report Status 06/13/2018 FINAL  Final  Blood culture (routine x 2)     Status: Abnormal   Collection Time: 05/28/2018 11:40 PM  Result Value Ref Range Status   Specimen Description   Final    BLOOD RIGHT ANTECUBITAL Performed at [facility], [address] [place], [place] [address]    Special Requests   Final    BOTTLES DRAWN AEROBIC AND ANAEROBIC Blood Culture results may not be optimal due to an excessive volume of blood received in culture bottles Performed at [facility], [address], [place], [place] [address]    Culture  Setup Time   Final    GRAM POSITIVE RODS IN BOTH AEROBIC AND ANAEROBIC BOTTLES CRITICAL RESULT CALLED TO, READ BACK BY AND VERIFIED WITH: HANK ZOMPA 06/09/18 1142 REC    Culture (A)  Final    PROTEUS MIRABILIS CLOSTRIDIUM SPECIES Standardized susceptibility testing for this organism is not available. Performed at [facility], [address], [place], [place] [address]    Report Status 06/13/2018 FINAL  Final   Organism ID, Bacteria PROTEUS MIRABILIS  Final      Susceptibility   Proteus mirabilis - MIC*    AMPICILLIN <=2 SENSITIVE Sensitive     CEFAZOLIN <=4 SENSITIVE Sensitive     CEFEPIME <=1 SENSITIVE Sensitive     CEFTAZIDIME <=1 SENSITIVE Sensitive     CEFTRIAXONE <=1 SENSITIVE Sensitive     CIPROFLOXACIN <=0.25 SENSITIVE Sensitive     GENTAMICIN <=1 SENSITIVE Sensitive     IMIPENEM 2 SENSITIVE Sensitive     TRIMETH/SULFA <=20 SENSITIVE Sensitive     AMPICILLIN/SULBACTAM <=2 SENSITIVE Sensitive     PIP/TAZO <=4 SENSITIVE Sensitive     * PROTEUS MIRABILIS  Blood Culture ID Panel (Reflexed)     Status: None   Collection Time: 06/10/2018 11:40 PM  Result Value Ref Range Status   Enterococcus species NOT DETECTED NOT DETECTED Final   Listeria monocytogenes NOT DETECTED NOT DETECTED Final   Staphylococcus species NOT DETECTED NOT DETECTED Final   Staphylococcus aureus NOT DETECTED NOT DETECTED Final   Streptococcus species NOT DETECTED NOT DETECTED Final   Streptococcus agalactiae NOT DETECTED NOT DETECTED Final   Streptococcus pneumoniae NOT DETECTED NOT DETECTED Final   Streptococcus pyogenes NOT DETECTED NOT DETECTED Final   Acinetobacter baumannii NOT DETECTED NOT DETECTED Final   Enterobacteriaceae species NOT DETECTED NOT DETECTED Final   Enterobacter cloacae complex NOT DETECTED NOT DETECTED Final   Escherichia coli NOT DETECTED NOT DETECTED Final   Klebsiella oxytoca NOT DETECTED NOT DETECTED Final   Klebsiella pneumoniae NOT DETECTED NOT DETECTED Final   Proteus species NOT DETECTED NOT DETECTED Final   Serratia marcescens NOT DETECTED NOT DETECTED Final   Haemophilus influenzae NOT DETECTED NOT DETECTED Final   Neisseria meningitidis NOT DETECTED NOT DETECTED Final   Pseudomonas aeruginosa NOT DETECTED NOT  DETECTED Final   Candida albicans NOT DETECTED NOT DETECTED Final   Candida  glabrata NOT DETECTED NOT DETECTED Final   Candida krusei NOT DETECTED NOT DETECTED Final   Candida parapsilosis NOT DETECTED NOT DETECTED Final   Candida tropicalis NOT DETECTED NOT DETECTED Final    Comment: Performed at [facility], [address] [place], [place] [address]     Studies: No results found.  Scheduled Meds: . feeding supplement (NEPRO CARB STEADY)  237 mL Oral TID BM  . levETIRAcetam  500 mg Oral BID  . mouth rinse  15 mL Mouth Rinse BID  . Melatonin  5 mg Oral QHS  . metoprolol tartrate  25 mg Oral BID  . multivitamin with minerals  1 tablet Oral Daily  . sodium chloride flush  3 mL Intravenous Q12H  . tamsulosin  0.4 mg Oral QPC breakfast   Continuous Infusions: . sodium chloride 10 mL/hr at 06/13/18 0215  . cefTRIAXone (ROCEPHIN)  IV Stopped (06/12/18 2220)  . metronidazole 500 mg (06/13/18 0902)    Assessment/Plan:   1. Proteus sepsis from indwelling Foley catheter.  Blood cultures and urine cultures positive.  Foley catheter changed.  This will have to be changed every 4 weeks.  Switch antibiotics to Unasyn which would cover Proteus and Clostridium 2. Clostridium species also and blood culture.  Switch antibiotics to Unasyn which would cover Proteus and Clostridium.  Case discussed with infectious disease and Clostridium can go along with a GI malignancy.  Case discussed with POA and patient's fianc.  The POA deferred to the patient's fianc.  The patient's fianc wants to proceed with GI work-up at this time.  I mentioned that the patient is likely not a great candidate for colonoscopy and/or treatment if this patient does have a GI malignancy.  We will have gastroenterology see the patient and determine whether they feel the patient is able to undergo a colonoscopy.  If they feel the patient is unable to go through a colonoscopy may end up getting a CT scan of the abdomen and pelvis.  Neysa Bonito said the patient had these tests done at the [place].  I will  have nursing staff try to obtain those records.  PICC line placement for IV antibiotics. 3. Atrial fibrillation with rapid ventricular response.  Continue oral metoprolol not a good candidate for anticoagulation. 4. History of seizures on Keppra 5. Drop in hemoglobin.  Discontinue IV fluids.  Guaiac stools.  Check hemoglobin tomorrow morning 6. Failure to thrive, cachexia and malnutrition.  Overall prognosis is poor.  Patient is a DNR.  Fianc refused patient to go to hospice home.  She would like to go back to facility once stable to do so. 7. BPH on Flomax  Code Status:     Code Status Orders  (From admission, onward)         Start     Ordered   06/10/18 1120  Do not attempt resuscitation (DNR)  Continuous    Question Answer Comment  In the event of cardiac or respiratory ARREST Do not call a "code blue"   In the event of cardiac or respiratory ARREST Do not perform Intubation, CPR, defibrillation or ACLS   In the event of cardiac or respiratory ARREST Use medication by any route, position, wound care, and other measures to relive pain and suffering. May use oxygen, suction and manual treatment of airway obstruction as needed for comfort.      06/10/18 1121  Code Status History    Date Active Date Inactive Code Status Order ID Comments User Context   06/09/2018 0209 06/10/2018 1121 DNR [id number]  Barbaraann Rondo, MD ED   05/15/2018 1120 05/18/2018 2303 Full Code [id number]  Auburn Bilberry, MD Inpatient   05/15/2018 0125 05/15/2018 1120 Full Code [id number]  Cammy Copa, MD Inpatient    Advance Directive Documentation     Most Recent Value  Type of Advance Directive  Healthcare Power of Attorney  Pre-existing out of facility DNR order (yellow form or pink MOST form)  -  "MOST" Form in Place?  -     Disposition Plan: Back to [facility] when stable to do so  Antibiotics:  Unasyn  Time spent: 35 minutes, including ACP time  [employer]

## 2018-06-13 NOTE — Plan of Care (Signed)
  Problem: Nutrition: Goal: Adequate nutrition will be maintained Outcome: Progressing Pt continues to only drink a little each day.   Problem: Coping: Goal: Level of anxiety will decrease Outcome: Progressing   Problem: Pain Managment: Goal: General experience of comfort will improve Outcome: Progressing   Problem: Safety: Goal: Ability to remain free from injury will improve Outcome: Progressing   Problem: Skin Integrity: Goal: Risk for impaired skin integrity will decrease Outcome: Progressing   Problem: Urinary Elimination: Goal: Signs and symptoms of infection will decrease Outcome: Progressing   Problem: Activity: Goal: Risk for activity intolerance will decrease Outcome: Not Progressing Patient is still bedridden needing max assisstants

## 2018-06-13 NOTE — Care Management Important Message (Signed)
Initial Medicare IM signed by [name], patient's sister and HCPOA. Copy left in room for reference.

## 2018-06-13 NOTE — Plan of Care (Signed)
  Problem: Nutrition: Goal: Adequate nutrition will be maintained Outcome: Progressing   Problem: Coping: Goal: Level of anxiety will decrease Outcome: Progressing   Problem: Pain Managment: Goal: General experience of comfort will improve Outcome: Progressing   Problem: Safety: Goal: Ability to remain free from injury will improve Outcome: Progressing   Problem: Skin Integrity: Goal: Risk for impaired skin integrity will decrease Outcome: Progressing   Problem: Urinary Elimination: Goal: Signs and symptoms of infection will decrease Outcome: Progressing

## 2018-06-13 NOTE — Progress Notes (Signed)
Pharmacy Antibiotic Note  Alexander Lara is a 72 y.o. male admitted on 05/21/2018 with sepsis.  Pharmacy has been consulted for Unasyn dosing.  Plan: Unasyn 3 gm IV Q6H  Height: 5\' 11"  (180.3 cm) Weight: 66 lb (29.9 kg) IBW/kg (Calculated) : 75.3  Temp (24hrs), Avg:98.1 F (36.7 C), Min:98 F (36.7 C), Max:98.4 F (36.9 C)  Recent Labs  Lab 05/25/2018 2045 06/10/18 0432 06/11/18 0600 06/12/18 0456 06/13/18 1057  WBC 11.5* 8.9 6.2 6.8  --   CREATININE 1.00 0.83 0.91 0.68 0.85    Estimated Creatinine Clearance: 32.7 mL/min (by C-G formula based on SCr of 0.85 mg/dL).    No Known Allergies  Antimicrobials this admission:   Dose adjustments this admission:   Microbiology results:  BCx:   UCx:    Sputum:    MRSA PCR:   Thank you for allowing pharmacy to be a part of this patient's care.  Carola Frost, Pharm.D., BCPS Clinical Pharmacist 06/13/2018 1:26 PM

## 2018-06-13 NOTE — Care Management Important Message (Signed)
Spoke with Leonie Man, sister and Avon Gully, at [phone] go over Medicare IM.  She states she will be in room this afternoon around 3pm.  Will check with her around 3:30pm to get initial IM signed.

## 2018-06-13 NOTE — Progress Notes (Signed)
Peripherally Inserted Central Catheter/Midline Placement  The IV Nurse has discussed with the patient and/or persons authorized to consent for the patient, the purpose of this procedure and the potential benefits and risks involved with this procedure.  The benefits include less needle sticks, lab draws from the catheter, and the patient may be discharged home with the catheter. Risks include, but not limited to, infection, bleeding, blood clot (thrombus formation), and puncture of an artery; nerve damage and irregular heartbeat and possibility to perform a PICC exchange if needed/ordered by physician.  Alternatives to this procedure were also discussed.  Bard Power PICC patient education guide, fact sheet on infection prevention and patient information card has been provided to patient /or left at bedside.    PICC/Midline Placement Documentation  PICC Single Lumen 06/13/18 PICC Left Brachial 43 cm 0 cm (Active)  Indication for Insertion or Continuance of Line Home intravenous therapies (PICC only) 06/13/2018  5:00 PM  Exposed Catheter (cm) 0 cm 06/13/2018  5:00 PM  Site Assessment Clean;Dry;Intact 06/13/2018  5:00 PM  Line Status Flushed;Blood return noted 06/13/2018  5:00 PM  Dressing Type Transparent 06/13/2018  5:00 PM  Dressing Status Clean;Dry;Intact;Antimicrobial disc in place 06/13/2018  5:00 PM  Dressing Intervention New dressing 06/13/2018  5:00 PM  Dressing Change Due 06/20/18 06/13/2018  5:00 PM       Stacie Glaze Horton 06/13/2018, 5:07 PM

## 2018-06-13 NOTE — Progress Notes (Signed)
Alexander Bouillon, MD [address], [address], [place], [place], [address] [address], [place], [place], [address] Phone: [phone]  Fax: [phone]  Consultation  Referring Provider:     Dr. Hilton Lara Primary Care Physician:  [facility] Reason for Consultation:     Positive Clostridium on blood cultures  Date of Admission:  05/16/2018 Date of Consultation:  06/13/2018         HPI:   Alexander Lara is a 72 y.o. male who is a patient at the [place], with history of subdural hematoma due to a fall about 8 months ago, and reported deterioration and failure to thrive since then.  Patient admitted with urinary retention, and GI consulted due to blood culture showing Clostridium species and evaluation for colonoscopy.  History provided by patient's power of attorney, Alexander Lara, patient's sister.  Patient does not answer any questions and is laying in bed comfortably.  History also obtained from documentation patient's chart.  Patient sister does not think patient has ever had a colonoscopy prior to this.  However, states that he has been getting Cologuard testing in the past, but did not get his last one done, and unsure when this was due.  They deny noticing any blood per rectum, or any diarrhea recently.   Past Medical History:  Diagnosis Date  . Anxiety   . Emphysema of lung (HCC)   . Foley catheter in place   . Seizures (HCC)   . Subdural hematoma (HCC)   . Urinary retention     History reviewed. No pertinent surgical history.  Prior to Admission medications   Medication Sig Start Date End Date Taking? Authorizing Provider  acetaminophen (TYLENOL) 325 MG tablet Take 650 mg by mouth every 6 (six) hours as needed for mild pain.   Yes [provider]  docusate (COLACE) 50 MG/5ML liquid Take 100 mg by mouth daily.   Yes [provider]  ENSURE PLUS (ENSURE PLUS) LIQD Take 237 mLs by mouth 2 (two) times daily between meals.   Yes [provider]  ferrous sulfate 220 (44 Fe) MG/5ML solution Take 330 mg by mouth 2 (two) times daily with a meal.   Yes [provider]  levETIRAcetam (KEPPRA) 100 MG/ML solution Take 500 mg by mouth 2 (two) times daily.   Yes [provider]  magnesium oxide (MAG-OX) 400 (241.3 Mg) MG tablet Take 1 tablet (400 mg total) by mouth daily. 05/19/18  Yes Enedina Finner, MD  megestrol (MEGACE) 400 MG/10ML suspension Take 20 mLs (800 mg total) by mouth daily. 05/19/18  Yes Enedina Finner, MD  Multiple Vitamin (MULTIVITAMIN) LIQD Take 15 mLs by mouth daily.   Yes [provider]  phosphorus (K PHOS NEUTRAL) 155-852-130 MG tablet Take 2 tablets (500 mg total) by mouth daily. 05/19/18  Yes Enedina Finner, MD  tamsulosin (FLOMAX) 0.4 MG CAPS capsule Take 1 capsule (0.4 mg total) by mouth daily. 05/19/18  Yes Enedina Finner, MD  vitamin C (VITAMIN C) 250 MG tablet Take 1 tablet (250 mg total) by mouth 2 (two) times daily. 05/18/18  Yes Enedina Finner, MD    History reviewed. No pertinent family history.   Social History   Tobacco Use  . Smoking status: Former Games developer  . Smokeless tobacco: Never Used  Substance Use Topics  . Alcohol use: No    Frequency: Never  . Drug use: No    Allergies as of 05/29/2018  . (No Known Allergies)  Review of Systems:    All systems reviewed and negative except where noted in HPI.   Physical Exam:  Vital signs in last 24 hours: Vitals:   06/12/18 1711 06/12/18 1955 06/13/18 0437 06/13/18 0803  BP: (!) 144/79 (!) 144/76 (!) 156/90 139/79  Pulse: 91 93 98 84  Resp: 20 18 18 18   Temp:  98 F (36.7 C) 98 F (36.7 C) 98.4 F (36.9 C)  TempSrc:  Oral  Oral  SpO2: 100% 97% 100% 99%  Weight:      Height:       Last BM Date: 06/11/18 General:   Pleasant, cooperative in NAD Head:  Normocephalic and atraumatic. Eyes:   No icterus.   Conjunctiva pink. PERRLA. Ears:  Normal auditory acuity. Neck:  Supple; no masses or thyroidomegaly Lungs: Respirations  even and unlabored. Lungs clear to auscultation bilaterally.   No wheezes, crackles, or rhonchi.  Abdomen:  Soft, nondistended, nontender. Normal bowel sounds. No appreciable masses or hepatomegaly.  No rebound or guarding.  Neurologic:  Alert and oriented x3;  grossly normal neurologically. Skin:  Intact without significant lesions or rashes. Cervical Nodes:  No significant cervical adenopathy. Psych:  Alert and cooperative. Normal affect.  LAB RESULTS: Recent Labs    06/11/18 0600 06/12/18 0456 06/13/18 1057  WBC 6.2 6.8  --   HGB 9.7* 7.9* 9.4*  HCT 28.7* 23.1*  --   PLT 282 254  --    BMET Recent Labs    06/11/18 0600 06/12/18 0456 06/13/18 1057  NA 148* 148* 146*  K 4.2 3.5 3.8  CL 117* 119* 117*  CO2 22 21* 22  GLUCOSE 98 102* 109*  BUN 29* 29* 27*  CREATININE 0.91 0.68 0.85  CALCIUM 8.9 8.2* 8.4*   LFT No results for input(s): PROT, ALBUMIN, AST, ALT, ALKPHOS, BILITOT, BILIDIR, IBILI in the last 72 hours. PT/INR No results for input(s): LABPROT, INR in the last 72 hours.  STUDIES: No results found.    Impression / Plan:   Alexander Lara is a 72 y.o. y/o male with failure to thrive, subdural hematoma about 8 months ago, was reported deterioration since then, with family also reported multiple falls prior to that for which they did not seek medical attention, with GI being consulted for evaluation for colonoscopy given positive Clostridium on blood culture  Medical decisions are made by patient's power of attorney, Alexander Lara Power of attorney states she does not know if patient understands his medical care, and he does not answer questions to determine his understanding of his medical condition  I discussed that since he has never had a colonoscopy before, we are unable to rule out colon malignancy until colonoscopies done Positive Clostridium can be due to underlying colon malignancy   Power of attorney does not think patient would be able to tolerate a  colonoscopy prep and I am in agreement.  Patient is cachectic, does not answer questions, and is unlikely to drink an entire prep  Because of his failure to thrive may be due to his mental status versus other underlying malignancy  Would recommend further work-up with imaging of the abdomen and pelvis and possibly of the chest as well with CT scan.  This is to evaluate for any underlying malignancy.  If CT scan shows colonic malignancy, the benefits of the procedure would outweigh the risks at that time, and NG tube placement to allow for colonoscopy prep can be considered  Would also recommend iron levels to see if  he has iron deficiency.  His ferritin was in the 300s, but that can be elevated due to it being an acute phase reactant.  His hemoglobin is also noted to be low, albeit stable since admission.  Therefore, iron deficiency and anemia work-up is indicated at this time.  I have discussed this with the power of attorney and she is in agreement with the above plan  Would also recommend palliative care consult to establish goals of care given his reportedly deteriorating condition over the last year  Above plan discussed with primary team, Dr. Hilton Lara and he is in agreement.  Thank you for involving me in the care of this patient.      LOS: 4 days   Pasty Spillers, MD  06/13/2018, 11:37 AM

## 2018-06-13 NOTE — Progress Notes (Signed)
Patient ID: Alexander Lara, male   DOB: [birth year], 72 y.o.   MRN: [id number]  ACP note  Patient unable to participate in conversation.  Spoke with POA her sister on the phone.  Spoke with fianc.  Diagnosis: Proteus sepsis from indwelling Foley catheter, Clostridium sepsis, atrial fibrillation, history of seizures, drop in hemoglobin, failure to thrive, cachexia and malnutrition, BPH  Plan.  Long discussion about how to proceed and treatment.  We can go conservative treatment with just oral antibiotics and treat the infection.  Since Clostridium can go along with a GI malignancy and the patient is cachectic, I did mention this to the fianc and she wanted to proceed with a GI work-up.  I mentioned that the patient may not be a good candidate for colonoscopy and or treatment if this turns out to be a GI malignancy.  She still wanted the evaluation.  She mentioned that the patient did have CT scans at the [place].  I will try to get a copy of this.  Patient's overall prognosis is poor regardless of what we do.  I do not think the patient would be a good candidate for any treatments even if this turns out to be a GI malignancy.  Fianc still wanted to proceed with work-up.  Aggressive management would include PICC line and IV antibiotics of Unasyn.  Await GI opinion.  Time spent on ACP discussion 20 minutes Dr. Alford Highland

## 2018-06-14 MED ORDER — FINASTERIDE 5 MG PO TABS
5.0000 mg | ORAL_TABLET | Freq: Every day | ORAL | Status: DC
  Administered 2018-06-15: 5 mg via ORAL
  Filled 2018-06-14: qty 1

## 2018-06-14 MED ORDER — SODIUM CHLORIDE 0.9 % IV SOLN
INTRAVENOUS | Status: DC
  Administered 2018-06-14: 14:00:00 via INTRAVENOUS

## 2018-06-14 NOTE — Progress Notes (Addendum)
Melodie Bouillon, MD [address], [address], [place], [place], [address] [address], [place], [place], [address] Phone: [phone]  Fax: [phone]   Subjective:  No signs of GI bleeding.  Patient does not answer questions.  No acute events overnight.  Objective: Exam: Vital signs in last 24 hours: Vitals:   06/13/18 0803 06/13/18 1723 06/13/18 2000 06/14/18 0526  BP: 139/79 (!) 145/79 134/64 138/83  Pulse: 84 71 86 81  Resp: 18 20    Temp: 98.4 F (36.9 C) 98.6 F (37 C) 97.7 F (36.5 C) 98 F (36.7 C)  TempSrc: Oral Oral Oral Oral  SpO2: 99% 100% 94% 97%  Weight:      Height:       Weight change:   Intake/Output Summary (Last 24 hours) at 06/14/2018 1536 Last data filed at 06/14/2018 0539 Gross per 24 hour  Intake 292.2 ml  Output 800 ml  Net -507.8 ml    General: No acute distress, AAO x3 Abd: Soft, NT/ND, No HSM Skin: Warm, no rashes Neck: Supple, Trachea midline   Lab Results: Lab Results  Component Value Date   WBC 6.8 06/12/2018   HGB 9.4 (L) 06/13/2018   HCT 23.1 (L) 06/12/2018   MCV 78.2 (L) 06/12/2018   PLT 254 06/12/2018   Micro Results: Recent Results (from the past 240 hour(s))  Urine Culture     Status: Abnormal   Collection Time: 06/11/2018  7:51 PM  Result Value Ref Range Status   Specimen Description   Final    URINE, RANDOM Performed at [facility], [address] [place], [place] [address]    Special Requests   Final    NONE Performed at [facility], [address] [place], [place] [address]    Culture >=100,000 COLONIES/mL PROTEUS MIRABILIS (A)  Final   Report Status 06/11/2018 FINAL  Final   Organism ID, Bacteria PROTEUS MIRABILIS (A)  Final      Susceptibility   Proteus mirabilis - MIC*    AMPICILLIN <=2 SENSITIVE Sensitive     CEFAZOLIN <=4 SENSITIVE Sensitive     CEFTRIAXONE <=1 SENSITIVE Sensitive     CIPROFLOXACIN <=0.25 SENSITIVE Sensitive     GENTAMICIN <=1 SENSITIVE  Sensitive     IMIPENEM 4 SENSITIVE Sensitive     NITROFURANTOIN 128 RESISTANT Resistant     TRIMETH/SULFA <=20 SENSITIVE Sensitive     AMPICILLIN/SULBACTAM <=2 SENSITIVE Sensitive     PIP/TAZO <=4 SENSITIVE Sensitive     * >=100,000 COLONIES/mL PROTEUS MIRABILIS  Blood Culture ID Panel (Reflexed)     Status: Abnormal   Collection Time: 05/22/2018 10:57 PM  Result Value Ref Range Status   Enterococcus species NOT DETECTED NOT DETECTED Final   Listeria monocytogenes NOT DETECTED NOT DETECTED Final   Staphylococcus species NOT DETECTED NOT DETECTED Final   Staphylococcus aureus NOT DETECTED NOT DETECTED Final   Streptococcus species NOT DETECTED NOT DETECTED Final   Streptococcus agalactiae NOT DETECTED NOT DETECTED Final   Streptococcus pneumoniae NOT DETECTED NOT DETECTED Final   Streptococcus pyogenes NOT DETECTED NOT DETECTED Final   Acinetobacter baumannii NOT DETECTED NOT DETECTED Final   Enterobacteriaceae species DETECTED (A) NOT DETECTED Final    Comment: Enterobacteriaceae represent a large family of gram-negative bacteria, not a single organism. CRITICAL RESULT CALLED TO, READ BACK BY AND VERIFIED WITH: JASON ROBBINS AT 1936 06/10/18.PMH    Enterobacter cloacae complex NOT DETECTED NOT DETECTED Final   Escherichia coli NOT DETECTED NOT DETECTED  Final   Klebsiella oxytoca NOT DETECTED NOT DETECTED Final   Klebsiella pneumoniae NOT DETECTED NOT DETECTED Final   Proteus species DETECTED (A) NOT DETECTED Final    Comment: CRITICAL RESULT CALLED TO, READ BACK BY AND VERIFIED WITH: JASON ROBBINS AT 1936 06/10/18.PMH    Serratia marcescens NOT DETECTED NOT DETECTED Final   Carbapenem resistance NOT DETECTED NOT DETECTED Final   Haemophilus influenzae NOT DETECTED NOT DETECTED Final   Neisseria meningitidis NOT DETECTED NOT DETECTED Final   Pseudomonas aeruginosa NOT DETECTED NOT DETECTED Final   Candida albicans NOT DETECTED NOT DETECTED Final   Candida glabrata NOT DETECTED NOT  DETECTED Final   Candida krusei NOT DETECTED NOT DETECTED Final   Candida parapsilosis NOT DETECTED NOT DETECTED Final   Candida tropicalis NOT DETECTED NOT DETECTED Final    Comment: Performed at [facility], [address] [place], [place] [address]  Blood culture (routine x 2)     Status: Abnormal   Collection Time: 06/12/2018 11:40 PM  Result Value Ref Range Status   Specimen Description   Final    BLOOD LEFT ANTECUBITAL Performed at [facility], [address] [place], [place] [address]    Special Requests   Final    BOTTLES DRAWN AEROBIC AND ANAEROBIC Blood Culture results may not be optimal due to an excessive volume of blood received in culture bottles Performed at [facility], [address], [place], [place] [address]    Culture  Setup Time   Final    GRAM POSITIVE RODS ANAEROBIC BOTTLE ONLY CRITICAL VALUE NOTED.  VALUE IS CONSISTENT WITH PREVIOUSLY REPORTED AND CALLED VALUE. GRAM NEGATIVE RODS AEROBIC BOTTLE ONLY CRITICAL RESULT CALLED TO, READ BACK BY AND VERIFIED WITH: JASON ROBBINS AT 1936 06/10/18.PMH Performed at [facility], [address] [place], [place] [address]    Culture (A)  Final    PROTEUS MIRABILIS SUSCEPTIBILITIES PERFORMED ON PREVIOUS CULTURE WITHIN THE LAST 5 DAYS. CLOSTRIDIUM SPECIES Standardized susceptibility testing for this organism is not available. Performed at [facility], [address] [place], [place] [address]    Report Status 06/13/2018 FINAL  Final  Blood culture (routine x 2)     Status: Abnormal   Collection Time: 06/03/2018 11:40 PM  Result Value Ref Range Status   Specimen Description   Final    BLOOD RIGHT ANTECUBITAL Performed at [facility], [address] [place], [place] [address]    Special Requests   Final    BOTTLES DRAWN AEROBIC AND ANAEROBIC Blood Culture results may not be optimal due to an excessive volume of blood received in culture bottles Performed at  [facility], [address] [place], [place] [address]    Culture  Setup Time   Final    GRAM POSITIVE RODS IN BOTH AEROBIC AND ANAEROBIC BOTTLES CRITICAL RESULT CALLED TO, READ BACK BY AND VERIFIED WITH: HANK ZOMPA 06/09/18 1142 REC    Culture (A)  Final    PROTEUS MIRABILIS CLOSTRIDIUM SPECIES Standardized susceptibility testing for this organism is not available. Performed at [facility], [address] [place], [place] [address]    Report Status 06/13/2018 FINAL  Final   Organism ID, Bacteria PROTEUS MIRABILIS  Final      Susceptibility   Proteus mirabilis - MIC*    AMPICILLIN <=2 SENSITIVE Sensitive     CEFAZOLIN <=4 SENSITIVE Sensitive     CEFEPIME <=1 SENSITIVE Sensitive     CEFTAZIDIME <=1  SENSITIVE Sensitive     CEFTRIAXONE <=1 SENSITIVE Sensitive     CIPROFLOXACIN <=0.25 SENSITIVE Sensitive     GENTAMICIN <=1 SENSITIVE Sensitive     IMIPENEM 2 SENSITIVE Sensitive     TRIMETH/SULFA <=20 SENSITIVE Sensitive     AMPICILLIN/SULBACTAM <=2 SENSITIVE Sensitive     PIP/TAZO <=4 SENSITIVE Sensitive     * PROTEUS MIRABILIS  Blood Culture ID Panel (Reflexed)     Status: None   Collection Time: 06/12/2018 11:40 PM  Result Value Ref Range Status   Enterococcus species NOT DETECTED NOT DETECTED Final   Listeria monocytogenes NOT DETECTED NOT DETECTED Final   Staphylococcus species NOT DETECTED NOT DETECTED Final   Staphylococcus aureus NOT DETECTED NOT DETECTED Final   Streptococcus species NOT DETECTED NOT DETECTED Final   Streptococcus agalactiae NOT DETECTED NOT DETECTED Final   Streptococcus pneumoniae NOT DETECTED NOT DETECTED Final   Streptococcus pyogenes NOT DETECTED NOT DETECTED Final   Acinetobacter baumannii NOT DETECTED NOT DETECTED Final   Enterobacteriaceae species NOT DETECTED NOT DETECTED Final   Enterobacter cloacae complex NOT DETECTED NOT DETECTED Final   Escherichia coli NOT DETECTED NOT DETECTED Final   Klebsiella oxytoca NOT DETECTED NOT  DETECTED Final   Klebsiella pneumoniae NOT DETECTED NOT DETECTED Final   Proteus species NOT DETECTED NOT DETECTED Final   Serratia marcescens NOT DETECTED NOT DETECTED Final   Haemophilus influenzae NOT DETECTED NOT DETECTED Final   Neisseria meningitidis NOT DETECTED NOT DETECTED Final   Pseudomonas aeruginosa NOT DETECTED NOT DETECTED Final   Candida albicans NOT DETECTED NOT DETECTED Final   Candida glabrata NOT DETECTED NOT DETECTED Final   Candida krusei NOT DETECTED NOT DETECTED Final   Candida parapsilosis NOT DETECTED NOT DETECTED Final   Candida tropicalis NOT DETECTED NOT DETECTED Final    Comment: Performed at [facility], [address], [place], [place] [address]   Studies/Results: Ct Chest W Contrast  Result Date: 06/13/2018 CLINICAL DATA:  Unintentional weight loss. EXAM: CT CHEST, ABDOMEN, AND PELVIS WITH CONTRAST TECHNIQUE: Multidetector CT imaging of the chest, abdomen and pelvis was performed following the standard protocol during bolus administration of intravenous contrast. CONTRAST:  50mL OMNIPAQUE IOHEXOL 300 MG/ML  SOLN COMPARISON:  None. FINDINGS: CT CHEST FINDINGS Cardiovascular: Small pericardial effusion with a maximum thickness of 10 mm. Atheromatous calcifications, including the coronary arteries and aorta. Mediastinum/Nodes: No enlarged mediastinal, hilar, or axillary lymph nodes. Thyroid gland, trachea, and esophagus demonstrate no significant findings. Lungs/Pleura: The lungs are hyperexpanded with mild bullous changes. Irregular patchy densities in the right lower lobe and inferior right upper lobe. Minimal bilateral dependent atelectasis. No pleural fluid. Musculoskeletal: Approximately 30% T11 vertebral body superior endplate compression deformity with sclerosis and minimal bony retropulsion. No acute fracture lines. CT ABDOMEN PELVIS FINDINGS Hepatobiliary: No focal liver abnormality is seen. No gallstones, gallbladder wall thickening, or biliary  dilatation. Pancreas: Unremarkable. No pancreatic ductal dilatation or surrounding inflammatory changes. Spleen: Normal in size without focal abnormality. Adrenals/Urinary Tract: Foley catheter in the urinary bladder with associated air in the bladder. Multiple dependent calculi in the urinary bladder. These are difficult to separate from each other with the largest individual calculus that can be separated measuring 5 mm in diameter. Normal appearing adrenal glands, kidneys and ureters. Stomach/Bowel: Limited due to the lack of intravenous and oral contrast and lack of body fat. No gross abnormality of the stomach, small bowel or colon seen. No evidence of appendicitis. Vascular/Lymphatic: Atheromatous arterial calcifications without aneurysm. No enlarged lymph  nodes. Reproductive: Normal sized prostate gland. Other: No abdominal wall hernia or abnormality. No abdominopelvic ascites. Musculoskeletal: Changes of avascular necrosis involving both femoral heads without bony collapse. Approximately 40% old L2 superior endplate compression deformity with minimal bony retropulsion, sclerosis and no acute fracture lines. Mild lumbar spine degenerative changes. IMPRESSION: 1. Patchy densities in the right lower lobe and inferior right upper lobe, compatible with incompletely resolved pneumonia. Follow-up chest radiographs are recommended until this completely clears. 2. Mild-to-moderate changes of COPD. 3. Small pericardial effusion. 4. Multiple dependent calculi in the urinary bladder. 5. 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 Aortic Atherosclerosis (ICD10-I70.0) and Emphysema (ICD10-J43.9). Electronically Signed   By: Beckie Salts M.D.   On: 06/13/2018 15:45   Ct Abdomen Pelvis W Contrast  Result Date: 06/13/2018 CLINICAL DATA:  Unintentional weight loss. EXAM: CT CHEST, ABDOMEN, AND PELVIS WITH CONTRAST TECHNIQUE: Multidetector CT imaging of the chest, abdomen and pelvis was performed  following the standard protocol during bolus administration of intravenous contrast. CONTRAST:  50mL OMNIPAQUE IOHEXOL 300 MG/ML  SOLN COMPARISON:  None. FINDINGS: CT CHEST FINDINGS Cardiovascular: Small pericardial effusion with a maximum thickness of 10 mm. Atheromatous calcifications, including the coronary arteries and aorta. Mediastinum/Nodes: No enlarged mediastinal, hilar, or axillary lymph nodes. Thyroid gland, trachea, and esophagus demonstrate no significant findings. Lungs/Pleura: The lungs are hyperexpanded with mild bullous changes. Irregular patchy densities in the right lower lobe and inferior right upper lobe. Minimal bilateral dependent atelectasis. No pleural fluid. Musculoskeletal: Approximately 30% T11 vertebral body superior endplate compression deformity with sclerosis and minimal bony retropulsion. No acute fracture lines. CT ABDOMEN PELVIS FINDINGS Hepatobiliary: No focal liver abnormality is seen. No gallstones, gallbladder wall thickening, or biliary dilatation. Pancreas: Unremarkable. No pancreatic ductal dilatation or surrounding inflammatory changes. Spleen: Normal in size without focal abnormality. Adrenals/Urinary Tract: Foley catheter in the urinary bladder with associated air in the bladder. Multiple dependent calculi in the urinary bladder. These are difficult to separate from each other with the largest individual calculus that can be separated measuring 5 mm in diameter. Normal appearing adrenal glands, kidneys and ureters. Stomach/Bowel: Limited due to the lack of intravenous and oral contrast and lack of body fat. No gross abnormality of the stomach, small bowel or colon seen. No evidence of appendicitis. Vascular/Lymphatic: Atheromatous arterial calcifications without aneurysm. No enlarged lymph nodes. Reproductive: Normal sized prostate gland. Other: No abdominal wall hernia or abnormality. No abdominopelvic ascites. Musculoskeletal: Changes of avascular necrosis involving  both femoral heads without bony collapse. Approximately 40% old L2 superior endplate compression deformity with minimal bony retropulsion, sclerosis and no acute fracture lines. Mild lumbar spine degenerative changes. IMPRESSION: 1. Patchy densities in the right lower lobe and inferior right upper lobe, compatible with incompletely resolved pneumonia. Follow-up chest radiographs are recommended until this completely clears. 2. Mild-to-moderate changes of COPD. 3. Small pericardial effusion. 4. Multiple dependent calculi in the urinary bladder. 5. 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 Aortic Atherosclerosis (ICD10-I70.0) and Emphysema (ICD10-J43.9). Electronically Signed   By: Beckie Salts M.D.   On: 06/13/2018 15:45   [place] Ekg Site Rite  Result Date: 06/13/2018 If Site Rite image not attached, placement could not be confirmed due to current cardiac rhythm.  Medications:  Scheduled Meds: . feeding supplement (NEPRO CARB STEADY)  237 mL Oral TID BM  . [START ON 06/15/2018] finasteride  5 mg Oral Daily  . levETIRAcetam  500 mg Oral BID  . mouth rinse  15 mL Mouth Rinse BID  .  Melatonin  5 mg Oral QHS  . metoprolol tartrate  25 mg Oral BID  . multivitamin with minerals  1 tablet Oral Daily  . sodium chloride flush  10-40 mL Intracatheter Q12H  . sodium chloride flush  3 mL Intravenous Q12H   Continuous Infusions: . sodium chloride Stopped (06/14/18 0239)  . ampicillin-sulbactam (UNASYN) IV 3 g (06/14/18 1339)   PRN Meds:.sodium chloride, acetaminophen **OR** acetaminophen, albuterol, bisacodyl, LORazepam, metoprolol tartrate, ondansetron **OR** ondansetron (ZOFRAN) IV, polyvinyl alcohol, senna-docusate, sodium chloride flush   Assessment: Active Problems:   Recurrent UTI (urinary tract infection)   Adult failure to thrive   Palliative care by specialist    Plan: CT chest abdomen pelvis done yesterday did not show any signs of malignancy.  And reported patchy  densities in the right lower lobe and inferior right upper lobe compatible with incompletely resolved pneumonia.  Dr. Hilton Sinclair discussed the case with me, and the question of colonoscopy to rule out colon malignancy given the finding of Clostridium on blood cultures were discussed.  I recommended that if family would like invasive procedures and would like to proceed with colonoscopy, we can order a colonoscopy prep and consider NG tube placement if patient unable to finish his prep.  Dr. Hilton Sinclair discussed the case with the patient's family and they are refusing colonoscopy at this time.  Feeding tube placement was also a question since patient unable to maintain adequate oral nutrition as per nutritionist.  Given his CT chest finding of incompletely resolved pneumonia, and given his cachexia, EGD with sedation would have the risks of causing hypoxia and further complications during the procedure at this time.  Would recommend interventional radiology consult for evaluation of feeding tube placement.  Above discussed with Dr. Hilton Sinclair    LOS: 5 days   Melodie Bouillon, MD 06/14/2018, 3:36 PM

## 2018-06-14 NOTE — Progress Notes (Signed)
Nutrition Follow Up Note   DOCUMENTATION CODES:   Severe malnutrition in context of chronic illness  INTERVENTION:   Ideally, pt would likely need PEG/ IR G-tube placement and nutrition support to meet his estimated needs; this may not been in line with pt's GOC.   Pt likely at high refeeding risk; recommend monitor K, Mg and P labs   Nepro Shake po TID, each supplement provides 425 kcal and 19 grams protein  Magic cup TID with meals, each supplement provides 290 kcal and 9 grams of protein  MVI daily   NUTRITION DIAGNOSIS:   Severe Malnutrition related to chronic illness(emphysema ) as evidenced by severe fat depletion, severe muscle depletion.  GOAL:   Patient will meet greater than or equal to 90% of their needs  -not met  MONITOR:   PO intake, Weight trends, Labs, Supplement acceptance  ASSESSMENT:   Pt is resident of [facility] admitted 9/25 for urinary retention w/ foley catheter in place and failure to thrive. Previous admit 8/31PMH: emphysema, seizures, subdural heamtoma   Pt familiar to this RD from previous admit. Pt with poor appetite and oral intake at baseline. Pt with FTT and has consistently been declining in weight over the past year. Pt has lost 23lbs(25%) of his weight in 9 months; this is severe. It was recommended that pt have PEG tube placement during last admit but family was leaning more towards hospice at that time. Pt does drink some Nepro but even with supplements, is unable to keep up with his needs. If family would like to pursue full aggressive care, pt would likely benefit from PEG tube placement and nutrition support. Unsure of pt's GOC at this time as pt discharged from hospice and family requesting GI workup. Spoke to MD about PEG/IR G-tube placement; MD will discuss with GI. This has not been brought up to the family. Pt is at high refeeding risk. RD will continue to follow for [place].   Medications reviewed and include: melatonin, MVI, NaCl '@40ml' /hr,  Unasyn  Labs reviewed: Na 146(L) Hgb 9.4(L)- 9/30  Diet Order:   Diet Order            DIET - DYS 1 Room service appropriate? Yes; Fluid consistency: Nectar Thick  Diet effective now             EDUCATION NEEDS:   No education needs have been identified at this time  Skin:  Skin Assessment: Reviewed RN Assessment(bilateral arm ecchymosis)  Last BM:  9/28  Height:   Ht Readings from Last 1 Encounters:  05/21/2018 '5\' 11"'  (1.803 m)    Weight:   Wt Readings from Last 1 Encounters:  06/10/18 29.9 kg    Ideal Body Weight:  78.2 kg  BMI:  Body mass index is 9.21 kg/m.  Estimated Nutritional Needs:   Kcal:  1500-1800kcal/day   Protein:  54-60g/day   Fluid:  1.0L/day   Koleen Distance MS, RD, LDN Pager #- [phone] Office#- [phone] After Hours Pager: [phone]

## 2018-06-14 NOTE — Progress Notes (Signed)
Patient ID: Alexander Lara, male   DOB: 11/11/1945, 72 y.o.   MRN: [id number]  Acp note  Spoke with Sister at the bedside Spoke with fiancee twice today  Dx: Proteus sepsis from indwelling foley catheter, clostridium species, chronic aspiration pneumonia, atrial fibrillation, hx seizure, failure to thrive, cachexia, malnutrition, bph.  Plan: Unclear at this point.  No colonoscopy or PEG at this point.  Initially wanted very aggressive treatment then backed off a little bit.  Initially wanted IV antibiotics but if the patient goes back to the facility they can want oral antibiotics because they do not think the facility will be able to do the IV antibiotics.  The patient's fianc wants aggressive treatment and come back to the hospital every time he sick.  Sister would rather do hospice and comfort.  She is the POA but is allowing the fianc to make medical decisions.  Currently does not have a payer source for the rehab that he was at.  Social worker to look into things.  Time spent on ACP discussion 25 minutes Dr. Alford Highland

## 2018-06-14 NOTE — Care Management (Addendum)
Returned call to [employer] patients sister, [phone].  She was explaining how she is not satisfied with the care at [facility].  She does not want him to return there.  She states he gets multiple infections and is back in the hospital often from [facility].  She states she wants him to stay in the hospital.  I explained to her that patients do not stay in the hospital for rehabilitation.  He would need to discharge once medically stable to home or to a rehab facility.  She also explains how she is healthcare POA and wants him to go to hospice.  She states "I can't stand to see my brother like this and I know he's not going to get better".  The patients fiance disagrees with hospice.   Palliative consult last week.  Need current consult for plan of disposition.

## 2018-06-14 NOTE — Clinical Social Work Note (Signed)
CSW spoke with [facility] who stated patient has been at [facility] for 6 months, on a short term [place] contract which expired yesterday.  [facility] stated that [place] is not renewing the contract for patient, and that the patient's family have applied for Medicaid through [facility].  CSW also spoke with Santiago Glad at [place], and she said that hospice had stopped following patient because he started improving and gaining weight, hospice is not currently following patient.  CSW met with patient's sister Silva Bandy [phone] to discuss discharge planning.  Patient's sister state that she would like CSW to look into other SNFs for patient.  CSW explained to her that because patient is Medicaid pending, and not able to participate in rehab, her options will be very limited on where patient can go.  CSW explained that patient may have to return to [facility] if there are no other options.  Patient's sister expressed that she does not have a legal document that states she is the health care power of attorney, because patient never filled one out.  Patient's sister states she is next of kin because patient's sons live in [place], and are not involved in his care.  Patient's sister stated that patient and his fiance Judeen Hammans have been living together for 30 years, but never got married.  Patient's sister would prefer that Judeen Hammans make decisions because she is tired of deciding medical decisions for him.  Patient's sister would like CSW to see if health care power of attorney paperwork can be completed while patient is in hospital, and she can make patient's significant other the HCPOA.  CSW will follow up with her in the morning, for now, CSW was given permission to begin bed search in [facility].  CSW to continue to follow patient's progress throughout discharge planning.  Jones Broom. Norval Morton, MSW, [place]  06/14/2018 6:31 PM

## 2018-06-14 NOTE — Progress Notes (Signed)
Patient ID: Alexander Lara, male   DOB: September 24, 1945, 72 y.o.   MRN: [id number]  [facility] PROGRESS NOTE  Hershel Corkery NWG:[id number] DOB: 1946/03/29 DOA: 05/26/2018 PCP: [facility]  HPI/Subjective: Patient tried to answer few questions.    Objective: Vitals:   06/13/18 2000 06/14/18 0526  BP: 134/64 138/83  Pulse: 86 81  Resp:    Temp: 97.7 F (36.5 C) 98 F (36.7 C)  SpO2: 94% 97%    Filed Weights   06/13/2018 1948 06/09/18 0206 06/10/18 0338  Weight: 37.5 kg 28.9 kg 29.9 kg    ROS: Review of Systems  Unable to perform ROS: Acuity of condition  Respiratory: Negative for shortness of breath.   Cardiovascular: Negative for chest pain.   Exam: Physical Exam  Constitutional: He appears cachectic.  HENT:  Nose: No mucosal edema.  Mouth/Throat: No oropharyngeal exudate or posterior oropharyngeal edema.  Eyes: Pupils are equal, round, and reactive to light. Conjunctivae and lids are normal.  Neck: No JVD present. Carotid bruit is not present. No edema present. No thyroid mass and no thyromegaly present.  Cardiovascular: S1 normal and S2 normal. An irregularly irregular rhythm present. Exam reveals no gallop.  No murmur heard. Pulses:      Dorsalis pedis pulses are 2+ on the right side, and 2+ on the left side.  Respiratory: No respiratory distress. He has no wheezes. He has no rhonchi. He has no rales.  GI: Soft. Bowel sounds are normal. There is no tenderness.  Musculoskeletal:       Right ankle: He exhibits no swelling.       Left ankle: He exhibits no swelling.  Lymphadenopathy:    He has no cervical adenopathy.  Neurological: He is alert.  Skin: Skin is warm. No rash noted. Nails show no clubbing.  Psychiatric:  Alert but not answering questions      Data Reviewed: Basic Metabolic Panel: Recent Labs  Lab 05/25/2018 2045 06/09/18 1737 06/10/18 0432 06/11/18 0600 06/12/18 0456 06/13/18 1057  NA 144  --  145 148* 148* 146*  K 3.8 3.9 3.7 4.2  3.5 3.8  CL 112*  --  114* 117* 119* 117*  CO2 22  --  21* 22 21* 22  GLUCOSE 108*  --  103* 98 102* 109*  BUN 38*  --  34* 29* 29* 27*  CREATININE 1.00  --  0.83 0.91 0.68 0.85  CALCIUM 8.5*  --  8.4* 8.9 8.2* 8.4*  MG 1.9 1.8  --   --   --   --   PHOS 3.9  --   --   --   --   --    Liver Function Tests: Recent Labs  Lab 06/01/2018 2045  AST 14*  ALT 11  ALKPHOS 51  BILITOT 0.6  PROT 6.5  ALBUMIN 2.7*   CBC: Recent Labs  Lab 06/13/2018 2045 06/10/18 0432 06/11/18 0600 06/12/18 0456 06/13/18 1057  WBC 11.5* 8.9 6.2 6.8  --   NEUTROABS 9.7*  --   --   --   --   HGB 9.9* 8.7* 9.7* 7.9* 9.4*  HCT 28.9* 25.4* 28.7* 23.1*  --   MCV 77.7* 77.6* 78.4* 78.2*  --   PLT 316 274 282 254  --    Cardiac Enzymes:   Recent Results (from the past 240 hour(s))  Urine Culture     Status: Abnormal   Collection Time: 06/01/2018  7:51 PM  Result Value Ref Range Status  Specimen Description   Final    URINE, RANDOM Performed at [facility], [address] [place], [place] [address]    Special Requests   Final    NONE Performed at [facility], [address] [place], [place] [address]    Culture >=100,000 COLONIES/mL PROTEUS MIRABILIS (A)  Final   Report Status 06/11/2018 FINAL  Final   Organism ID, Bacteria PROTEUS MIRABILIS (A)  Final      Susceptibility   Proteus mirabilis - MIC*    AMPICILLIN <=2 SENSITIVE Sensitive     CEFAZOLIN <=4 SENSITIVE Sensitive     CEFTRIAXONE <=1 SENSITIVE Sensitive     CIPROFLOXACIN <=0.25 SENSITIVE Sensitive     GENTAMICIN <=1 SENSITIVE Sensitive     IMIPENEM 4 SENSITIVE Sensitive     NITROFURANTOIN 128 RESISTANT Resistant     TRIMETH/SULFA <=20 SENSITIVE Sensitive     AMPICILLIN/SULBACTAM <=2 SENSITIVE Sensitive     PIP/TAZO <=4 SENSITIVE Sensitive     * >=100,000 COLONIES/mL PROTEUS MIRABILIS  Blood Culture ID Panel (Reflexed)     Status: Abnormal   Collection Time: 05/21/2018 10:57 PM  Result Value Ref Range Status    Enterococcus species NOT DETECTED NOT DETECTED Final   Listeria monocytogenes NOT DETECTED NOT DETECTED Final   Staphylococcus species NOT DETECTED NOT DETECTED Final   Staphylococcus aureus NOT DETECTED NOT DETECTED Final   Streptococcus species NOT DETECTED NOT DETECTED Final   Streptococcus agalactiae NOT DETECTED NOT DETECTED Final   Streptococcus pneumoniae NOT DETECTED NOT DETECTED Final   Streptococcus pyogenes NOT DETECTED NOT DETECTED Final   Acinetobacter baumannii NOT DETECTED NOT DETECTED Final   Enterobacteriaceae species DETECTED (A) NOT DETECTED Final    Comment: Enterobacteriaceae represent a large family of gram-negative bacteria, not a single organism. CRITICAL RESULT CALLED TO, READ BACK BY AND VERIFIED WITH: JASON ROBBINS AT 1936 06/10/18.PMH    Enterobacter cloacae complex NOT DETECTED NOT DETECTED Final   Escherichia coli NOT DETECTED NOT DETECTED Final   Klebsiella oxytoca NOT DETECTED NOT DETECTED Final   Klebsiella pneumoniae NOT DETECTED NOT DETECTED Final   Proteus species DETECTED (A) NOT DETECTED Final    Comment: CRITICAL RESULT CALLED TO, READ BACK BY AND VERIFIED WITH: JASON ROBBINS AT 1936 06/10/18.PMH    Serratia marcescens NOT DETECTED NOT DETECTED Final   Carbapenem resistance NOT DETECTED NOT DETECTED Final   Haemophilus influenzae NOT DETECTED NOT DETECTED Final   Neisseria meningitidis NOT DETECTED NOT DETECTED Final   Pseudomonas aeruginosa NOT DETECTED NOT DETECTED Final   Candida albicans NOT DETECTED NOT DETECTED Final   Candida glabrata NOT DETECTED NOT DETECTED Final   Candida krusei NOT DETECTED NOT DETECTED Final   Candida parapsilosis NOT DETECTED NOT DETECTED Final   Candida tropicalis NOT DETECTED NOT DETECTED Final    Comment: Performed at [facility] [address] [place], [place] [address]  Blood culture (routine x 2)     Status: Abnormal   Collection Time: 06/03/2018 11:40 PM  Result Value Ref Range Status   Specimen  Description   Final    BLOOD LEFT ANTECUBITAL Performed at [facility], [address] [place], [place] [address]    Special Requests   Final    BOTTLES DRAWN AEROBIC AND ANAEROBIC Blood Culture results may not be optimal due to an excessive volume of blood received in culture bottles Performed at [facility], [address] [place], [place] [address]    Culture  Setup Time  Final    GRAM POSITIVE RODS ANAEROBIC BOTTLE ONLY CRITICAL VALUE NOTED.  VALUE IS CONSISTENT WITH PREVIOUSLY REPORTED AND CALLED VALUE. GRAM NEGATIVE RODS AEROBIC BOTTLE ONLY CRITICAL RESULT CALLED TO, READ BACK BY AND VERIFIED WITH: JASON ROBBINS AT 1936 06/10/18.PMH Performed at [facility], [address] [place], [place] [address]    Culture (A)  Final    PROTEUS MIRABILIS SUSCEPTIBILITIES PERFORMED ON PREVIOUS CULTURE WITHIN THE LAST 5 DAYS. CLOSTRIDIUM SPECIES Standardized susceptibility testing for this organism is not available. Performed at [facility], [address] [place], [place] [address]    Report Status 06/13/2018 FINAL  Final  Blood culture (routine x 2)     Status: Abnormal   Collection Time: 05/19/2018 11:40 PM  Result Value Ref Range Status   Specimen Description   Final    BLOOD RIGHT ANTECUBITAL Performed at [facility], [address] [place], [place] [address]    Special Requests   Final    BOTTLES DRAWN AEROBIC AND ANAEROBIC Blood Culture results may not be optimal due to an excessive volume of blood received in culture bottles Performed at [facility], [address], [place], [place] [address]    Culture  Setup Time   Final    GRAM POSITIVE RODS IN BOTH AEROBIC AND ANAEROBIC BOTTLES CRITICAL RESULT CALLED TO, READ BACK BY AND VERIFIED WITH: HANK ZOMPA 06/09/18 1142 REC    Culture (A)  Final    PROTEUS MIRABILIS CLOSTRIDIUM SPECIES Standardized susceptibility testing for this organism is not available. Performed  at [facility], [address] [place], [place] [address]    Report Status 06/13/2018 FINAL  Final   Organism ID, Bacteria PROTEUS MIRABILIS  Final      Susceptibility   Proteus mirabilis - MIC*    AMPICILLIN <=2 SENSITIVE Sensitive     CEFAZOLIN <=4 SENSITIVE Sensitive     CEFEPIME <=1 SENSITIVE Sensitive     CEFTAZIDIME <=1 SENSITIVE Sensitive     CEFTRIAXONE <=1 SENSITIVE Sensitive     CIPROFLOXACIN <=0.25 SENSITIVE Sensitive     GENTAMICIN <=1 SENSITIVE Sensitive     IMIPENEM 2 SENSITIVE Sensitive     TRIMETH/SULFA <=20 SENSITIVE Sensitive     AMPICILLIN/SULBACTAM <=2 SENSITIVE Sensitive     PIP/TAZO <=4 SENSITIVE Sensitive     * PROTEUS MIRABILIS  Blood Culture ID Panel (Reflexed)     Status: None   Collection Time: 05/15/2018 11:40 PM  Result Value Ref Range Status   Enterococcus species NOT DETECTED NOT DETECTED Final   Listeria monocytogenes NOT DETECTED NOT DETECTED Final   Staphylococcus species NOT DETECTED NOT DETECTED Final   Staphylococcus aureus NOT DETECTED NOT DETECTED Final   Streptococcus species NOT DETECTED NOT DETECTED Final   Streptococcus agalactiae NOT DETECTED NOT DETECTED Final   Streptococcus pneumoniae NOT DETECTED NOT DETECTED Final   Streptococcus pyogenes NOT DETECTED NOT DETECTED Final   Acinetobacter baumannii NOT DETECTED NOT DETECTED Final   Enterobacteriaceae species NOT DETECTED NOT DETECTED Final   Enterobacter cloacae complex NOT DETECTED NOT DETECTED Final   Escherichia coli NOT DETECTED NOT DETECTED Final   Klebsiella oxytoca NOT DETECTED NOT DETECTED Final   Klebsiella pneumoniae NOT DETECTED NOT DETECTED Final   Proteus species NOT DETECTED NOT DETECTED Final   Serratia marcescens NOT DETECTED NOT DETECTED Final   Haemophilus influenzae NOT DETECTED NOT DETECTED Final   Neisseria meningitidis NOT DETECTED NOT DETECTED Final   Pseudomonas aeruginosa NOT DETECTED NOT  DETECTED Final   Candida albicans NOT DETECTED NOT DETECTED  Final   Candida glabrata NOT DETECTED NOT DETECTED Final   Candida krusei NOT DETECTED NOT DETECTED Final   Candida parapsilosis NOT DETECTED NOT DETECTED Final   Candida tropicalis NOT DETECTED NOT DETECTED Final    Comment: Performed at [facility], [address], [place], [place] [address]     Studies: Ct Chest W Contrast  Result Date: 06/13/2018 CLINICAL DATA:  Unintentional weight loss. EXAM: CT CHEST, ABDOMEN, AND PELVIS WITH CONTRAST TECHNIQUE: Multidetector CT imaging of the chest, abdomen and pelvis was performed following the standard protocol during bolus administration of intravenous contrast. CONTRAST:  50mL OMNIPAQUE IOHEXOL 300 MG/ML  SOLN COMPARISON:  None. FINDINGS: CT CHEST FINDINGS Cardiovascular: Small pericardial effusion with a maximum thickness of 10 mm. Atheromatous calcifications, including the coronary arteries and aorta. Mediastinum/Nodes: No enlarged mediastinal, hilar, or axillary lymph nodes. Thyroid gland, trachea, and esophagus demonstrate no significant findings. Lungs/Pleura: The lungs are hyperexpanded with mild bullous changes. Irregular patchy densities in the right lower lobe and inferior right upper lobe. Minimal bilateral dependent atelectasis. No pleural fluid. Musculoskeletal: Approximately 30% T11 vertebral body superior endplate compression deformity with sclerosis and minimal bony retropulsion. No acute fracture lines. CT ABDOMEN PELVIS FINDINGS Hepatobiliary: No focal liver abnormality is seen. No gallstones, gallbladder wall thickening, or biliary dilatation. Pancreas: Unremarkable. No pancreatic ductal dilatation or surrounding inflammatory changes. Spleen: Normal in size without focal abnormality. Adrenals/Urinary Tract: Foley catheter in the urinary bladder with associated air in the bladder. Multiple dependent calculi in the urinary bladder. These are difficult to separate from each other with the largest individual calculus that can be  separated measuring 5 mm in diameter. Normal appearing adrenal glands, kidneys and ureters. Stomach/Bowel: Limited due to the lack of intravenous and oral contrast and lack of body fat. No gross abnormality of the stomach, small bowel or colon seen. No evidence of appendicitis. Vascular/Lymphatic: Atheromatous arterial calcifications without aneurysm. No enlarged lymph nodes. Reproductive: Normal sized prostate gland. Other: No abdominal wall hernia or abnormality. No abdominopelvic ascites. Musculoskeletal: Changes of avascular necrosis involving both femoral heads without bony collapse. Approximately 40% old L2 superior endplate compression deformity with minimal bony retropulsion, sclerosis and no acute fracture lines. Mild lumbar spine degenerative changes. IMPRESSION: 1. Patchy densities in the right lower lobe and inferior right upper lobe, compatible with incompletely resolved pneumonia. Follow-up chest radiographs are recommended until this completely clears. 2. Mild-to-moderate changes of COPD. 3. Small pericardial effusion. 4. Multiple dependent calculi in the urinary bladder. 5. 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 Aortic Atherosclerosis (ICD10-I70.0) and Emphysema (ICD10-J43.9). Electronically Signed   By: Beckie Salts M.D.   On: 06/13/2018 15:45   Ct Abdomen Pelvis W Contrast  Result Date: 06/13/2018 CLINICAL DATA:  Unintentional weight loss. EXAM: CT CHEST, ABDOMEN, AND PELVIS WITH CONTRAST TECHNIQUE: Multidetector CT imaging of the chest, abdomen and pelvis was performed following the standard protocol during bolus administration of intravenous contrast. CONTRAST:  50mL OMNIPAQUE IOHEXOL 300 MG/ML  SOLN COMPARISON:  None. FINDINGS: CT CHEST FINDINGS Cardiovascular: Small pericardial effusion with a maximum thickness of 10 mm. Atheromatous calcifications, including the coronary arteries and aorta. Mediastinum/Nodes: No enlarged mediastinal, hilar, or axillary lymph  nodes. Thyroid gland, trachea, and esophagus demonstrate no significant findings. Lungs/Pleura: The lungs are hyperexpanded with mild bullous changes. Irregular patchy densities in the right lower lobe and inferior right upper lobe. Minimal bilateral dependent atelectasis. No pleural fluid. Musculoskeletal: Approximately 30%  T11 vertebral body superior endplate compression deformity with sclerosis and minimal bony retropulsion. No acute fracture lines. CT ABDOMEN PELVIS FINDINGS Hepatobiliary: No focal liver abnormality is seen. No gallstones, gallbladder wall thickening, or biliary dilatation. Pancreas: Unremarkable. No pancreatic ductal dilatation or surrounding inflammatory changes. Spleen: Normal in size without focal abnormality. Adrenals/Urinary Tract: Foley catheter in the urinary bladder with associated air in the bladder. Multiple dependent calculi in the urinary bladder. These are difficult to separate from each other with the largest individual calculus that can be separated measuring 5 mm in diameter. Normal appearing adrenal glands, kidneys and ureters. Stomach/Bowel: Limited due to the lack of intravenous and oral contrast and lack of body fat. No gross abnormality of the stomach, small bowel or colon seen. No evidence of appendicitis. Vascular/Lymphatic: Atheromatous arterial calcifications without aneurysm. No enlarged lymph nodes. Reproductive: Normal sized prostate gland. Other: No abdominal wall hernia or abnormality. No abdominopelvic ascites. Musculoskeletal: Changes of avascular necrosis involving both femoral heads without bony collapse. Approximately 40% old L2 superior endplate compression deformity with minimal bony retropulsion, sclerosis and no acute fracture lines. Mild lumbar spine degenerative changes. IMPRESSION: 1. Patchy densities in the right lower lobe and inferior right upper lobe, compatible with incompletely resolved pneumonia. Follow-up chest radiographs are recommended until  this completely clears. 2. Mild-to-moderate changes of COPD. 3. Small pericardial effusion. 4. Multiple dependent calculi in the urinary bladder. 5. 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 Aortic Atherosclerosis (ICD10-I70.0) and Emphysema (ICD10-J43.9). Electronically Signed   By: Beckie Salts M.D.   On: 06/13/2018 15:45   [place] Ekg Site Rite  Result Date: 06/13/2018 If Site Rite image not attached, placement could not be confirmed due to current cardiac rhythm.   Scheduled Meds: . feeding supplement (NEPRO CARB STEADY)  237 mL Oral TID BM  . [START ON 06/15/2018] finasteride  5 mg Oral Daily  . levETIRAcetam  500 mg Oral BID  . mouth rinse  15 mL Mouth Rinse BID  . Melatonin  5 mg Oral QHS  . metoprolol tartrate  25 mg Oral BID  . multivitamin with minerals  1 tablet Oral Daily  . sodium chloride flush  10-40 mL Intracatheter Q12H  . sodium chloride flush  3 mL Intravenous Q12H   Continuous Infusions: . sodium chloride Stopped (06/14/18 0239)  . ampicillin-sulbactam (UNASYN) IV 3 g (06/14/18 1339)    Assessment/Plan:   1. Proteus sepsis from indwelling Foley catheter.  Blood cultures and urine cultures positive.  Foley catheter changed.  This will have to be changed every 4 weeks.  Switched antibiotics to Unasyn which would cover Proteus and Clostridium 2. Clostridium species also and blood culture.  Switch antibiotics to Unasyn which would cover Clostridium species also.  Spoke with fianc who took over control of the medical care of this patient and she decided against doing a colonoscopy to rule out GI malignancy. 3. 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  CT scan not showing any signs of malignancy at this point.  Unasyn would cover this also. 4. Atrial fibrillation with rapid ventricular response.  Continue oral metoprolol not a good candidate for anticoagulation. 5. History of seizures on Keppra 6. Drop in  hemoglobin.  Discontinue IV fluids.  Guaiac stools.  Check hemoglobin tomorrow morning 7. Failure to thrive, cachexia and malnutrition.  Overall prognosis is poor.  Patient is a DNR.  Dietitian asked me about a PEG feeding tube.  Gastroenterology would not want  to do that and put him under sedation.  When I tried to call the fianc back about asking this question on whether or not she would want it I was only able to leave a message.  Reconsult palliative care. 8. BPH.  Started Proscar and discontinued Flomax as per fianc's request.  Code Status:     Code Status Orders  (From admission, onward)         Start     Ordered   06/10/18 1120  Do not attempt resuscitation (DNR)  Continuous    Question Answer Comment  In the event of cardiac or respiratory ARREST Do not call a "code blue"   In the event of cardiac or respiratory ARREST Do not perform Intubation, CPR, defibrillation or ACLS   In the event of cardiac or respiratory ARREST Use medication by any route, position, wound care, and other measures to relive pain and suffering. May use oxygen, suction and manual treatment of airway obstruction as needed for comfort.      06/10/18 1121        Code Status History    Date Active Date Inactive Code Status Order ID Comments User Context   06/09/2018 0209 06/10/2018 1121 DNR [id number]  Barbaraann Rondo, MD ED   05/15/2018 1120 05/18/2018 2303 Full Code [id number]  Auburn Bilberry, MD Inpatient   05/15/2018 0125 05/15/2018 1120 Full Code [id number]  Cammy Copa, MD Inpatient    Advance Directive Documentation     Most Recent Value  Type of Advance Directive  Healthcare Power of Attorney  Pre-existing out of facility DNR order (yellow form or pink MOST form)  -  "MOST" Form in Place?  -     Disposition Plan: Unclear at this point.  Fianc now not wanting to go back to the facility that she was at.  Social worker to look into new facility.  Reconsult palliative  care  Antibiotics:  Unasyn  Time spent: 34 minutes.  In speaking with the fianc earlier and gastroenterology, social worker and dietitian.  Tried to speak with the fianc again this afternoon.  Naja Apperson [employer]

## 2018-06-14 NOTE — Care Management Note (Signed)
Case Management Note  Patient Details  Name: Alexander Lara MRN: [id number] Date of Birth: September 04, 1946  Subjective/Objective:     Patient is from [facility] with recurrent UTI.  Had a chronic urinary catheter in place.  Was recently discharged from hospice.  He is positive for c-diff.  Patient was going to discharge to hospice but family changed mind.  They are now wanting a GI work up.  PICC line placed last evening for IV Unasyn.  Pending GI consult to rule out malignancy.            Action/Plan:   Expected Discharge Date:                  Expected Discharge Plan:  [facility] Home  In-House Referral:     Discharge planning Services     Post Acute Care Choice:    Choice offered to:     DME Arranged:    DME Agency:     HH Arranged:    HH Agency:     Status of Service:  In process, will continue to follow  If discussed at Long Length of Stay Meetings, dates discussed:    Additional Comments:  Sherren Kerns, RN 06/14/2018, 10:47 AM

## 2018-06-14 DEATH — deceased

## 2018-06-15 LAB — CBC
HEMATOCRIT: 24.3 % — AB (ref 40.0–52.0)
HEMOGLOBIN: 8.2 g/dL — AB (ref 13.0–18.0)
MCH: 26.9 pg (ref 26.0–34.0)
MCHC: 33.9 g/dL (ref 32.0–36.0)
MCV: 79.2 fL — AB (ref 80.0–100.0)
Platelets: 239 10*3/uL (ref 150–440)
RBC: 3.07 MIL/uL — ABNORMAL LOW (ref 4.40–5.90)
RDW: 25.8 % — ABNORMAL HIGH (ref 11.5–14.5)
WBC: 6.5 10*3/uL (ref 3.8–10.6)

## 2018-06-15 LAB — BASIC METABOLIC PANEL
Anion gap: 5 (ref 5–15)
BUN: 23 mg/dL (ref 8–23)
CHLORIDE: 118 mmol/L — AB (ref 98–111)
CO2: 25 mmol/L (ref 22–32)
CREATININE: 0.65 mg/dL (ref 0.61–1.24)
Calcium: 7.9 mg/dL — ABNORMAL LOW (ref 8.9–10.3)
GFR calc Af Amer: >60 mL/min (ref >60)
GFR calc non Af Amer: >60 mL/min (ref >60)
Glucose, Bld: 100 mg/dL — ABNORMAL HIGH (ref 70–99)
POTASSIUM: 3.2 mmol/L — AB (ref 3.5–5.1)
SODIUM: 148 mmol/L — AB (ref 135–145)

## 2018-06-15 MED ORDER — DEXTROSE-NACL 5-0.9 % IV SOLN
INTRAVENOUS | Status: DC
  Administered 2018-06-15 – 2018-06-16 (×2): via INTRAVENOUS

## 2018-06-15 MED ORDER — POTASSIUM CHLORIDE 10 MEQ/100ML IV SOLN
10.0000 meq | INTRAVENOUS | Status: AC
  Administered 2018-06-15 (×3): 10 meq via INTRAVENOUS
  Filled 2018-06-15 (×3): qty 100

## 2018-06-15 NOTE — Evaluation (Signed)
Clinical/Bedside Swallow Evaluation Patient Details  Name: Alexander Lara MRN: [id number] Date of Birth: [birth year]  Today's Date: 06/15/2018 Time: SLP Start Time (ACUTE ONLY): 1610 SLP Stop Time (ACUTE ONLY): 1700 SLP Time Calculation (min) (ACUTE ONLY): 50 min  Past Medical History:  Past Medical History:  Diagnosis Date  . Anxiety   . Emphysema of lung (HCC)   . Foley catheter in place   . Seizures (HCC)   . Subdural hematoma (HCC)   . Urinary retention    Past Surgical History: History reviewed. No pertinent surgical history. HPI:  Pt is a 72 y.o. male, NH resident, with a known history of COPD, subdural hematoma, chronic urinary catheter, due to urinary retention, seizures, anxiety. UA is positive for UTI and chest x-ray shows patchy multifocal airspace disease throughout the right mid to lower lung concerning for bronchopneumonia. Pt has a [facility].    Assessment / Plan / Recommendation Clinical Impression  Pt appears to present w/ Severe oropharyngeal phase dysphagia w/ High risk for aspiration, choking. Pt's baseline of Dysphagia has been ongoing per family report last admission; Moderate oropharyngeal phase Dysphagia dx'd last admission when seen by this service on 05/16/2018. Per family last admission, pt had been declining since late 2018(weight of 88 lbs then per chart note) then had another fall and subdural hematoma w/ chronic urinary catheter due to urinary retention ~5 months ago. Pt has been receiving full care for ADLs since then at SNF; significant weight loss w/ discussion of PEG placement for nutritional support per Dietician note, Hosp[facility] services at [facility]. Per last BSE note, pt had been taking both Nectar and Honey consistency liquids at SNF, puree foods w/ feeding assistance. Pt is unable to wear his dentures. NSG contacted SLP this admission w/ request for BSE today d/t concern for swallowing. NSG stated pt had  been pocketing the puree which she had to "remove" d/t oral pocketing. She also noted a congested cough w/ trials of Nectar liquids when she fed him.  Pt was positioned w/ head forward; chalenging d/t pt's somewhat contracted status. SLP fed pt TSP trials of Nectar liquids w/ no immediate, overt s/s of aspiration and no decline in respiratory presentation during/post po trials. Post 4 TSP trials, pt attempted a "half"(weak) followed by a delayed, congested cough. W/ trials of Honey liquids(2), pt exhibited a half, effortful swallow then indicated it was "too hard" to swallow those trials. Laryngeal excursion was reduced; multiple swallows noted (pharyngeal residue suspected). Pt's exhibited oral phase deficits c/b increased oral phase time w/ bolus trials, lingual ruminating/smacking in effort to transfer boluses posteriorly, oral holding, anterior leakage(moreso on Left d/t leaning to Left). Given mod. time and increased lingual movements and swallowing, some oral clearing was achieved. D/t poor lingual coordination and strength for A-P transfer, and labial protrusion/forward position, oral residue collected in Left buccal area; this occurred w/ saliva at baseline prior to po's. Recommend NPO status w/ attempt at objective swallow study(MBSS) tomorrow for better assessment of safety w/ po intake. Recommend f/u by Palliative Care services for goals of care. MD/NSG updated.  SLP Visit Diagnosis: Dysphagia, oropharyngeal phase (R13.12)    Aspiration Risk  Severe aspiration risk;Risk for inadequate nutrition/hydration    Diet Recommendation  NPO status w/ oral care for hygiene and stimulation of swallowing  Medication Administration: Via alternative means    Other  Recommendations Recommended Consults: (Palliative Care, Dietician, GI following) Oral Care Recommendations: Oral care QID;Staff/trained  caregiver to provide oral care(w/ aspiration precautions for hygiene and stimulation) Other Recommendations:  (TBD)   Follow up Recommendations (TBD)      Frequency and Duration (TBD)  (TBD)       Prognosis Prognosis for Safe Diet Advancement: Guarded Barriers to Reach Goals: Cognitive deficits;Time post onset;Severity of deficits      Swallow Study   General Date of Onset: 06/15/18 HPI: Pt is a 72 y.o. male, NH resident, with a known history of COPD, subdural hematoma, chronic urinary catheter, due to urinary retention, seizures, anxiety. UA is positive for UTI and chest x-ray shows patchy multifocal airspace disease throughout the right mid to lower lung concerning for bronchopneumonia. Pt has a baseline of [facility]roph[facility] dys[facility]ia [facility].  Type of Study: Bedside Swallow Evaluation Previous Swallow Assessment: 05/16/2018 at [facility]; during his admission to the [place]; then at [facility](SNF) Diet Prior to this Study: Dysphagia 1 (puree);Nectar-thick liquids Temperature Spikes Noted: (wbc 6.5;  temp 99.3) Respiratory Status: Room air History of Recent Intubation: No Behavior/Cognition: Alert;Cooperative;Pleasant mood;Confused;Distractible;Requires cueing(min drowsy - closed eyes 2x) Oral Cavity Assessment: Excessive secretions(drooling on left - pt leaning to his left side (min)) Oral Care Completed by SLP: Yes Oral Cavity - Dentition: Missing dentition(unable to wear dentures) Vision: (n/a) Self-Feeding Abilities: Total assist Patient Positioning: Postural control adequate for testing(somewhat contracted in bed) Baseline Vocal Quality: Low vocal intensity(mumbled/muttered speech) Volitional Cough: Cognitively unable to elicit Volitional Swallow: Unable to elicit    Oral/Motor/Sensory Function Overall Oral Motor/Sensory Function: Generalized oral weakness(constant oral-lingual ruminaating; open-mouth posture) Facial Strength: (open-mouth posture at rest) Lingual ROM: Within Functional Limits(appeared during movements) Lingual Symmetry: Within  Functional Limits(grossly) Lingual Strength: Reduced(during pressing on posterior tongue/base) Velum: (CNT) Mandible: (CNT)   Ice Chips Ice chips: Not tested   Thin Liquid Thin Liquid: Not tested    Nectar Thick Nectar Thick Liquid: Impaired Presentation: Spoon(4 trials) Oral Phase Impairments: Reduced lingual movement/coordination;Poor awareness of bolus;Reduced labial seal(continuous ruminating oral-lingual movements) Oral phase functional implications: Oral holding;Prolonged oral transit;Left anterior spillage;Oral residue;Left lateral sulci pocketing(leaning to Left side) Pharyngeal Phase Impairments: Suspected delayed Swallow;Multiple swallows;Cough - Delayed;Decreased hyoid-laryngeal movement(x2/4 trials) Other Comments: weak cough initially in response followed by a more effortful, congested cough   Honey Thick Honey Thick Liquid: Impaired Presentation: Spoon(2 trials) Oral Phase Impairments: Reduced labial seal;Reduced lingual movement/coordination;Poor awareness of bolus(continuous ruminating oral-lingual movements) Oral Phase Functional Implications: Left anterior spillage;Left lateral sulci pocketing;Oral residue;Prolonged oral transit;Oral holding(pt leaning to left side) Pharyngeal Phase Impairments: Suspected delayed Swallow;Decreased hyoid-laryngeal movement;Multiple swallows(appeared effortful for pt)   Puree Puree: Not tested   Solid     Solid: Not tested      Jerilynn Som, MS, CCC-SLP Eliam Snapp 06/15/2018,5:20 PM

## 2018-06-15 NOTE — Progress Notes (Signed)
Patient ID: Alexander Lara, male   DOB: 1946/05/31, 73 y.o.   MRN: [id number]  This NP visited patient at the bedside as a follow up for palliative medicine needs and emotional support.  I spoke with SO Alexander Lara regarding the current medical situation: diagnosis, prognosis, GOC, disposition and options.  We discussed the likelihood  that regardless of medical interventions the patient would ever  retrun to his baseline.  His long term prognosis is poor/limited and he is high risk for ongoing decompensation 2/2 to infections, malnutrition, skin breakdown and overall failure to thrive.   [name] speaks to the time the patient will improve, gain weight and be able to come home.  She tells me she doesn't see a "time when I would change my mind about this, I will never give up,  [name] wants to live"  We discussed life and death as a human condition.   Further discussion had  regarding the fact that legally she is not the medical decision maker for this patient. There is no HPOA documented.  His sons have been estranged and unavailable, this leaves his sister next in line.  Ms Cephus Shelling understands this.    I left message for son [name]/lives in [place] # [phone] and received no call back.  Discussed with SO the importance of making patient centered decisions;  regarding overall plan of care and treatment options,  ensuring decisions are within the context of the patients values and GOCs.  Questions and concerns addressed   Discussed with Dr Renae Gloss and Rhodia Albright  Total time spent on the unit was 35 minutes  Greater than 50% of the time was spent in counseling and coordination of care  Lorinda Creed NP  Palliative Medicine Team Team Phone # [phone] Pager [phone]

## 2018-06-15 NOTE — Plan of Care (Signed)

## 2018-06-15 NOTE — Care Management Important Message (Signed)
Copy of signed IM left with patient in room.  

## 2018-06-15 NOTE — Plan of Care (Signed)
  Problem: Clinical Measurements: Goal: Will remain free from infection Outcome: Progressing   Problem: Nutrition: Goal: Adequate nutrition will be maintained Outcome: Progressing   Problem: Elimination: Goal: Will not experience complications related to urinary retention Outcome: Progressing   Problem: Safety: Goal: Ability to remain free from injury will improve Outcome: Progressing   Problem: Skin Integrity: Goal: Risk for impaired skin integrity will decrease Outcome: Progressing   Problem: Urinary Elimination: Goal: Signs and symptoms of infection will decrease Outcome: Progressing

## 2018-06-15 NOTE — NC FL2 (Signed)
[place] MEDICAID FL2 LEVEL OF CARE SCREENING TOOL     IDENTIFICATION  Patient Name: Alexander Lara Birthdate: [birth year] Sex: male Admission Date (Current Location): 05/15/2018  [place] and [place] Number:  Chiropodist and Address:  [facility], [address], [place], [place] [address]      Provider Number: [id number]  Attending Physician Name and Address:  Alford Highland, MD  Relative Name and Phone Number:  Rogers,[name] Sister   [phone] or [facility] Significant other   [phone]     Current Level of Care: Hospital Recommended Level of Care: Skilled Nursing Facility Prior Approval Number:    Date Approved/Denied:   PASRR Number: [id number]  Discharge Plan: SNF    Current Diagnoses: Patient Active Problem List   Diagnosis Date Noted  . Recurrent UTI (urinary tract infection) 06/09/2018  . Adult failure to thrive   . Palliative care by specialist   . Protein-calorie malnutrition, severe 05/17/2018  . Sepsis (HCC) 05/14/2018    Orientation RESPIRATION BLADDER Height & Weight     Self  Normal Indwelling catheter Weight: 69 lb (31.3 kg) Height:  5\' 11"  (180.3 cm)  BEHAVIORAL SYMPTOMS/MOOD NEUROLOGICAL BOWEL NUTRITION STATUS      Continent Diet(Dysphagia 1)  AMBULATORY STATUS COMMUNICATION OF NEEDS Skin   Extensive Assist Verbally Normal                       Personal Care Assistance Level of Assistance  Bathing, Feeding, Dressing Bathing Assistance: Maximum assistance Feeding assistance: Maximum assistance Dressing Assistance: Maximum assistance     Functional Limitations Info  Sight, Hearing, Speech Sight Info: Adequate Hearing Info: Adequate Speech Info: Adequate    SPECIAL CARE FACTORS FREQUENCY  PT (By licensed PT)     PT Frequency: 5x a week              Contractures Contractures Info: Not present    Additional Factors Info  Code Status, Allergies Code Status Info: DNR Allergies  Info: NKA           Current Medications (06/15/2018):  This is the current hospital active medication list Current Facility-Administered Medications  Medication Dose Route Frequency Provider Last Rate Last Dose  . 0.9 %  sodium chloride infusion   Intravenous PRN Alford Highland, MD 10 mL/hr at 06/15/18 0803 500 mL at 06/15/18 0803  . acetaminophen (TYLENOL) tablet 650 mg  650 mg Oral Q6H PRN Barbaraann Rondo, MD   650 mg at 06/14/18 2227   Or  . acetaminophen (TYLENOL) suppository 650 mg  650 mg Rectal Q6H PRN Barbaraann Rondo, MD      . albuterol (PROVENTIL) (2.5 MG/3ML) 0.083% nebulizer solution 2.5 mg  2.5 mg Nebulization Q4H PRN Milagros Loll, MD   2.5 mg at 06/13/18 1922  . Ampicillin-Sulbactam (UNASYN) 3 g in sodium chloride 0.9 % 100 mL IVPB  3 g Intravenous Q6H Alford Highland, MD   Stopped at 06/15/18 [id number]  . bisacodyl (DULCOLAX) EC tablet 5 mg  5 mg Oral Daily PRN Barbaraann Rondo, MD   5 mg at 06/13/18 0625  . feeding supplement (NEPRO CARB STEADY) liquid 237 mL  237 mL Oral TID BM Houston Siren, MD 237 mL/hr at 06/15/18 0957 237 mL at 06/15/18 0957  . finasteride (PROSCAR) tablet 5 mg  5 mg Oral Daily Alford Highland, MD   5 mg at 06/15/18 0957  . levETIRAcetam (KEPPRA) 100 MG/ML solution 500 mg  500 mg Oral BID Barbaraann Rondo,  MD   500 mg at 06/15/18 0957  . LORazepam (ATIVAN) tablet 1 mg  1 mg Oral Q4H PRN Cammy Copa, MD   1 mg at 06/11/18 2106  . MEDLINE mouth rinse  15 mL Mouth Rinse BID Alford Highland, MD   15 mL at 06/15/18 0958  . Melatonin TABS 5 mg  5 mg Oral QHS Milagros Loll, MD   5 mg at 06/14/18 2218  . metoprolol tartrate (LOPRESSOR) injection 5 mg  5 mg Intravenous Q6H PRN Cammy Copa, MD   5 mg at 06/11/18 0024  . metoprolol tartrate (LOPRESSOR) tablet 25 mg  25 mg Oral BID Alford Highland, MD   25 mg at 06/15/18 0957  . multivitamin with minerals tablet 1 tablet  1 tablet Oral Daily Houston Siren, MD   1 tablet at 06/15/18 0957  .  ondansetron (ZOFRAN) tablet 4 mg  4 mg Oral Q6H PRN Barbaraann Rondo, MD       Or  . ondansetron (ZOFRAN) injection 4 mg  4 mg Intravenous Q6H PRN Barbaraann Rondo, MD      . polyvinyl alcohol (LIQUIFILM TEARS) 1.4 % ophthalmic solution 1 drop  1 drop Both Eyes QID PRN Valentina Lucks, Crystal, NP      . potassium chloride 10 mEq in 100 mL IVPB  10 mEq Intravenous Q1 Hr x 3 Wieting, Richard, MD 100 mL/hr at 06/15/18 0959 10 mEq at 06/15/18 0959  . senna-docusate (Senokot-S) tablet 1 tablet  1 tablet Oral QHS PRN Barbaraann Rondo, MD   1 tablet at 06/10/18 2055  . sodium chloride flush (NS) 0.9 % injection 10-40 mL  10-40 mL Intracatheter Q12H Wieting, Richard, MD   10 mL at 06/14/18 2200  . sodium chloride flush (NS) 0.9 % injection 10-40 mL  10-40 mL Intracatheter PRN Wieting, Richard, MD      . sodium chloride flush (NS) 0.9 % injection 3 mL  3 mL Intravenous Q12H Houston Siren, MD   3 mL at 06/14/18 2219     Discharge Medications: Please see discharge summary for a list of discharge medications.  Relevant Imaging Results:  Relevant Lab Results:   Additional Information SSN [id number]  Darleene Cleaver, [place]

## 2018-06-15 NOTE — Progress Notes (Signed)
Patient ID: Alexander Lara, male   DOB: 05-07-1946, 72 y.o.   MRN: [id number]   [facility] PROGRESS NOTE  [place]leton [place]ox WUJ:[id number] DOB: Jan 24, 1946 DOA: 2018/06/24 PCP: [facility]  HPI/Subjective: Patient a little more interactive today.  He actually said he does not feel well.  Then said he was cold.  Objective: Vitals:   06/15/18 0345 06/15/18 0751  BP: (!) 176/86 (!) 159/83  Pulse: 98 76  Resp:  18  Temp: 97.9 F (36.6 C) 98.1 F (36.7 C)  SpO2: 94% 99%    Filed Weights   06/09/18 0206 06/10/18 0338 06/15/18 0345  Weight: 28.9 kg 29.9 kg 31.3 kg    ROS: Review of Systems  Unable to perform ROS: Acuity of condition  Respiratory: Negative for shortness of breath.   Cardiovascular: Negative for chest pain.  Gastrointestinal: Negative for abdominal pain.   Exam: Physical Exam  Constitutional: He appears cachectic.  HENT:  Nose: No mucosal edema.  Mouth/Throat: No oropharyngeal exudate or posterior oropharyngeal edema.  Eyes: Pupils are equal, round, and reactive to light. Conjunctivae and lids are normal.  Neck: No JVD present. Carotid bruit is not present. No edema present. No thyroid mass and no thyromegaly present.  Cardiovascular: S1 normal and S2 normal. An irregularly irregular rhythm present. Exam reveals no gallop.  No murmur heard. Pulses:      Dorsalis pedis pulses are 2+ on the right side, and 2+ on the left side.  Respiratory: No respiratory distress. He has no wheezes. He has no rhonchi. He has no rales.  GI: Soft. Bowel sounds are normal. There is no tenderness.  Musculoskeletal:       Right ankle: He exhibits no swelling.       Left ankle: He exhibits no swelling.  Lymphadenopathy:    He has no cervical adenopathy.  Neurological: He is alert.  Skin: Skin is warm. No rash noted. Nails show no clubbing.  Psychiatric:  Alert but not answering questions      Data Reviewed: Basic Metabolic Panel: Recent Labs  Lab Jun 24, 2018 2045  06/09/18 1737 06/10/18 0432 06/11/18 0600 06/12/18 0456 06/13/18 1057 06/15/18 0557  NA 144  --  145 148* 148* 146* 148*  K 3.8 3.9 3.7 4.2 3.5 3.8 3.2*  CL 112*  --  114* 117* 119* 117* 118*  CO2 22  --  21* 22 21* 22 25  GLUCOSE 108*  --  103* 98 102* 109* 100*  BUN 38*  --  34* 29* 29* 27* 23  CREATININE 1.00  --  0.83 0.91 0.68 0.85 0.65  CALCIUM 8.5*  --  8.4* 8.9 8.2* 8.4* 7.9*  MG 1.9 1.8  --   --   --   --   --   PHOS 3.9  --   --   --   --   --   --    Liver Function Tests: Recent Labs  Lab Jun 24, 2018 2045  AST 14*  ALT 11  ALKPHOS 51  BILITOT 0.6  PROT 6.5  ALBUMIN 2.7*   CBC: Recent Labs  Lab June 24, 2018 2045 06/10/18 0432 06/11/18 0600 06/12/18 0456 06/13/18 1057 06/15/18 0557  WBC 11.5* 8.9 6.2 6.8  --  6.5  NEUTROABS 9.7*  --   --   --   --   --   HGB 9.9* 8.7* 9.7* 7.9* 9.4* 8.2*  HCT 28.9* 25.4* 28.7* 23.1*  --  24.3*  MCV 77.7* 77.6* 78.4* 78.2*  --  79.2*  PLT 316 274 282 254  --  239   Cardiac Enzymes:   Recent Results (from the past 240 hour(s))  Urine Culture     Status: Abnormal   Collection Time: 01-Jul-2018  7:51 PM  Result Value Ref Range Status   Specimen Description   Final    URINE, RANDOM Performed at [facility], [address] [place], [place] [address]    Special Requests   Final    NONE Performed at [facility], [address] [place], [place] [address]    Culture >=100,000 COLONIES/mL PROTEUS MIRABILIS (A)  Final   Report Status 06/11/2018 FINAL  Final   Organism ID, Bacteria PROTEUS MIRABILIS (A)  Final      Susceptibility   Proteus mirabilis - MIC*    AMPICILLIN <=2 SENSITIVE Sensitive     CEFAZOLIN <=4 SENSITIVE Sensitive     CEFTRIAXONE <=1 SENSITIVE Sensitive     CIPROFLOXACIN <=0.25 SENSITIVE Sensitive     GENTAMICIN <=1 SENSITIVE Sensitive     IMIPENEM 4 SENSITIVE Sensitive     NITROFURANTOIN 128 RESISTANT Resistant     TRIMETH/SULFA <=20 SENSITIVE Sensitive     AMPICILLIN/SULBACTAM <=2  SENSITIVE Sensitive     PIP/TAZO <=4 SENSITIVE Sensitive     * >=100,000 COLONIES/mL PROTEUS MIRABILIS  Blood Culture ID Panel (Reflexed)     Status: Abnormal   Collection Time: 07/01/2018 10:57 PM  Result Value Ref Range Status   Enterococcus species NOT DETECTED NOT DETECTED Final   Listeria monocytogenes NOT DETECTED NOT DETECTED Final   Staphylococcus species NOT DETECTED NOT DETECTED Final   Staphylococcus aureus NOT DETECTED NOT DETECTED Final   Streptococcus species NOT DETECTED NOT DETECTED Final   Streptococcus agalactiae NOT DETECTED NOT DETECTED Final   Streptococcus pneumoniae NOT DETECTED NOT DETECTED Final   Streptococcus pyogenes NOT DETECTED NOT DETECTED Final   Acinetobacter baumannii NOT DETECTED NOT DETECTED Final   Enterobacteriaceae species DETECTED (A) NOT DETECTED Final    Comment: Enterobacteriaceae represent a large family of gram-negative bacteria, not a single organism. CRITICAL RESULT CALLED TO, READ BACK BY AND VERIFIED WITH: JASON ROBBINS AT 1936 06/10/18.PMH    Enterobacter cloacae complex NOT DETECTED NOT DETECTED Final   Escherichia coli NOT DETECTED NOT DETECTED Final   Klebsiella oxytoca NOT DETECTED NOT DETECTED Final   Klebsiella pneumoniae NOT DETECTED NOT DETECTED Final   Proteus species DETECTED (A) NOT DETECTED Final    Comment: CRITICAL RESULT CALLED TO, READ BACK BY AND VERIFIED WITH: JASON ROBBINS AT 1936 06/10/18.PMH    Serratia marcescens NOT DETECTED NOT DETECTED Final   Carbapenem resistance NOT DETECTED NOT DETECTED Final   Haemophilus influenzae NOT DETECTED NOT DETECTED Final   Neisseria meningitidis NOT DETECTED NOT DETECTED Final   Pseudomonas aeruginosa NOT DETECTED NOT DETECTED Final   Candida albicans NOT DETECTED NOT DETECTED Final   Candida glabrata NOT DETECTED NOT DETECTED Final   Candida krusei NOT DETECTED NOT DETECTED Final   Candida parapsilosis NOT DETECTED NOT DETECTED Final   Candida tropicalis NOT DETECTED NOT  DETECTED Final    Comment: Performed at [facility], [address] [place], [place] [address]  Blood culture (routine x 2)     Status: Abnormal   Collection Time: 2018-07-01 11:40 PM  Result Value Ref Range Status   Specimen Description   Final    BLOOD LEFT ANTECUBITAL Performed at [facility], [address] [place], [place] [address]    Special Requests  Final    BOTTLES DRAWN AEROBIC AND ANAEROBIC Blood Culture results may not be optimal due to an excessive volume of blood received in culture bottles Performed at [facility], [address] [place], [place] [address]    Culture  Setup Time   Final    GRAM POSITIVE RODS ANAEROBIC BOTTLE ONLY CRITICAL VALUE NOTED.  VALUE IS CONSISTENT WITH PREVIOUSLY REPORTED AND CALLED VALUE. GRAM NEGATIVE RODS AEROBIC BOTTLE ONLY CRITICAL RESULT CALLED TO, READ BACK BY AND VERIFIED WITH: JASON ROBBINS AT 1936 06/10/18.PMH Performed at [facility], [address] [place], [place] [address]    Culture (A)  Final    PROTEUS MIRABILIS SUSCEPTIBILITIES PERFORMED ON PREVIOUS CULTURE WITHIN THE LAST 5 DAYS. CLOSTRIDIUM SPECIES Standardized susceptibility testing for this organism is not available. Performed at [facility], [address], [place], [place] [address]    Report Status 06/13/2018 FINAL  Final  Blood culture (routine x 2)     Status: Abnormal   Collection Time: 06/13/2018 11:40 PM  Result Value Ref Range Status   Specimen Description   Final    BLOOD RIGHT ANTECUBITAL Performed at [facility], [address] [place], [place] [address]    Special Requests   Final    BOTTLES DRAWN AEROBIC AND ANAEROBIC Blood Culture results may not be optimal due to an excessive volume of blood received in culture bottles Performed at [facility], [address], [place], [place] [address]    Culture  Setup Time   Final    GRAM POSITIVE RODS IN BOTH AEROBIC AND ANAEROBIC  BOTTLES CRITICAL RESULT CALLED TO, READ BACK BY AND VERIFIED WITH: HANK ZOMPA 06/09/18 1142 REC    Culture (A)  Final    PROTEUS MIRABILIS CLOSTRIDIUM SPECIES Standardized susceptibility testing for this organism is not available. Performed at [facility], [address], [place], [place] [address]    Report Status 06/13/2018 FINAL  Final   Organism ID, Bacteria PROTEUS MIRABILIS  Final      Susceptibility   Proteus mirabilis - MIC*    AMPICILLIN <=2 SENSITIVE Sensitive     CEFAZOLIN <=4 SENSITIVE Sensitive     CEFEPIME <=1 SENSITIVE Sensitive     CEFTAZIDIME <=1 SENSITIVE Sensitive     CEFTRIAXONE <=1 SENSITIVE Sensitive     CIPROFLOXACIN <=0.25 SENSITIVE Sensitive     GENTAMICIN <=1 SENSITIVE Sensitive     IMIPENEM 2 SENSITIVE Sensitive     TRIMETH/SULFA <=20 SENSITIVE Sensitive     AMPICILLIN/SULBACTAM <=2 SENSITIVE Sensitive     PIP/TAZO <=4 SENSITIVE Sensitive     * PROTEUS MIRABILIS  Blood Culture ID Panel (Reflexed)     Status: None   Collection Time: 13-Jun-2018 11:40 PM  Result Value Ref Range Status   Enterococcus species NOT DETECTED NOT DETECTED Final   Listeria monocytogenes NOT DETECTED NOT DETECTED Final   Staphylococcus species NOT DETECTED NOT DETECTED Final   Staphylococcus aureus NOT DETECTED NOT DETECTED Final   Streptococcus species NOT DETECTED NOT DETECTED Final   Streptococcus agalactiae NOT DETECTED NOT DETECTED Final   Streptococcus pneumoniae NOT DETECTED NOT DETECTED Final   Streptococcus pyogenes NOT DETECTED NOT DETECTED Final   Acinetobacter baumannii NOT DETECTED NOT DETECTED Final   Enterobacteriaceae species NOT DETECTED NOT DETECTED Final   Enterobacter cloacae complex NOT DETECTED NOT DETECTED Final   Escherichia coli NOT DETECTED NOT DETECTED Final   Klebsiella oxytoca NOT DETECTED NOT DETECTED Final   Klebsiella pneumoniae  NOT DETECTED NOT DETECTED Final   Proteus species NOT DETECTED NOT DETECTED Final   Serratia marcescens NOT  DETECTED NOT DETECTED Final   Haemophilus influenzae NOT DETECTED NOT DETECTED Final   Neisseria meningitidis NOT DETECTED NOT DETECTED Final   Pseudomonas aeruginosa NOT DETECTED NOT DETECTED Final   Candida albicans NOT DETECTED NOT DETECTED Final   Candida glabrata NOT DETECTED NOT DETECTED Final   Candida krusei NOT DETECTED NOT DETECTED Final   Candida parapsilosis NOT DETECTED NOT DETECTED Final   Candida tropicalis NOT DETECTED NOT DETECTED Final    Comment: Performed at [facility], [address], [place], [place] [address]     Studies: No results found.  Scheduled Meds: . feeding supplement (NEPRO CARB STEADY)  237 mL Oral TID BM  . finasteride  5 mg Oral Daily  . levETIRAcetam  500 mg Oral BID  . mouth rinse  15 mL Mouth Rinse BID  . Melatonin  5 mg Oral QHS  . metoprolol tartrate  25 mg Oral BID  . multivitamin with minerals  1 tablet Oral Daily  . sodium chloride flush  10-40 mL Intracatheter Q12H  . sodium chloride flush  3 mL Intravenous Q12H   Continuous Infusions: . sodium chloride 500 mL (06/15/18 1439)  . ampicillin-sulbactam (UNASYN) IV 3 g (06/15/18 1441)    Assessment/Plan:   1. Proteus sepsis from indwelling Foley catheter.  Blood cultures and urine cultures positive.  Foley catheter changed.  This will have to be changed every 4 weeks.  Switched antibiotics to Unasyn which would cover Proteus and Clostridium 2. Clostridium species also and blood culture.  IV Unasyn which would cover Clostridium species also.  They decided against colonoscopy. 3. 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  CT scan not showing any signs of malignancy at this point.  Unasyn would cover this also.  Nursing staff concerned about his swallowing and put in for a speech evaluation.  Family also decided against PEG feeding tube. 4. Atrial fibrillation with rapid ventricular response.  Continue oral metoprolol not a good candidate  for anticoagulation. 5. History of seizures on Keppra 6. Drop in hemoglobin.  Hemoglobin 8.2 today.  Continue to monitor. 7. Failure to thrive, cachexia and malnutrition.  Overall prognosis is poor.  Patient is a DNR.   8. BPH.  Started Proscar and discontinued Flomax as per fianc's request.  Code Status:     Code Status Orders  (From admission, onward)         Start     Ordered   06/10/18 1120  Do not attempt resuscitation (DNR)  Continuous    Question Answer Comment  In the event of cardiac or respiratory ARREST Do not call a "code blue"   In the event of cardiac or respiratory ARREST Do not perform Intubation, CPR, defibrillation or ACLS   In the event of cardiac or respiratory ARREST Use medication by any route, position, wound care, and other measures to relive pain and suffering. May use oxygen, suction and manual treatment of airway obstruction as needed for comfort.      06/10/18 1121        Code Status History    Date Active Date Inactive Code Status Order ID Comments User Context   06/09/2018 0209 06/10/2018 1121 DNR [id number]  Barbaraann Rondo, MD ED   05/15/2018 1120 05/18/2018 2303 Full Code [id number]  Auburn Bilberry, MD Inpatient   05/15/2018 0125 05/15/2018 1120 Full  Code [id number]  Cammy Copa, MD Inpatient    Advance Directive Documentation     Most Recent Value  Type of Advance Directive  Healthcare Power of Attorney  Pre-existing out of facility DNR order (yellow form or pink MOST form)  -  "MOST" Form in Place?  -     Disposition Plan: Awaiting for social worker to find a place for him that the family will be happy with.  The patient's overall prognosis is poor and he will not do well regardless of where he is.  Antibiotics:  Unasyn  Time spent: 25 minutes.  Spoke with sister and fianc yesterday  Millicent Blazejewski [employer]

## 2018-06-16 ENCOUNTER — Inpatient Hospital Stay: Payer: Medicare Other

## 2018-06-16 MED ORDER — SODIUM CHLORIDE 0.9 % IV SOLN
500.0000 mg | Freq: Two times a day (BID) | INTRAVENOUS | Status: DC
  Administered 2018-06-16 – 2018-06-18 (×5): 500 mg via INTRAVENOUS
  Filled 2018-06-16 (×2): qty 525
  Filled 2018-06-16 (×4): qty 5
  Filled 2018-06-16: qty 525

## 2018-06-16 MED ORDER — METOPROLOL TARTRATE 5 MG/5ML IV SOLN: 5.0000 mg | INTRAVENOUS | Status: DC | PRN

## 2018-06-16 NOTE — Evaluation (Signed)
Objective Swallowing Evaluation: Type of Study: MBS-Modified Barium Swallow Study   Patient Details  Name: Alexander Lara MRN: [id number] Date of Birth: 1946/09/03  Today's Date: 06/16/2018 Time: SLP Start Time (ACUTE ONLY): 1100 -SLP Stop Time (ACUTE ONLY): 1200  SLP Time Calculation (min) (ACUTE ONLY): 60 min   Past Medical History:  Past Medical History:  Diagnosis Date  . Anxiety   . Emphysema of lung (HCC)   . Foley catheter in place   . Seizures (HCC)   . Subdural hematoma (HCC)   . Urinary retention    Past Surgical History: History reviewed. No pertinent surgical history. HPI: Pt is a 72 y.o. male, NH resident, with a known history of COPD, subdural hematoma, chronic urinary catheter, due to urinary retention, seizures, anxiety. UA is positive for UTI and chest x-ray shows patchy multifocal airspace disease throughout the right mid to lower lung concerning for bronchopneumonia. Pt has a baseline of oropharyngeal phase [facility] a dysphagia diet.  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  CT scan not showing any signs of malignancy at this point.  Unasyn would cover this also.  Nursing staff concerned about his swallowing and put in for a speech evaluation.  Family also decided against PEG feeding tube.   Subjective: pt strapped into MBS chair; slightly contracted on his side leaning to his Left    Assessment / Plan / Recommendation  CHL IP CLINICAL IMPRESSIONS 06/16/2018  Clinical Impression This 72 year old man, with known history of COPD, subdural hematoma, oropharyngeal dysphagia, chronic aspiration pneumonia on the right side, and BMI of 9.62, is presenting with severe oropharyngeal dysphagia characterized by anterior oral loss, disorganized/weak oral management, pre-swallow spill to the valleculae, poor pharyngeal pressure generation, severe pharyngeal residue, reduced laryngeal vestibule closure at the height of the  swallow, and silent laryngeal penetration of pharyngeal residue.  During this study, the patient did not have sufficient oral strength and coordination to effect posterior transfer of thin or nectar-thick liquid.  The patient required over 7 seconds to effect posterior transfer of a puree bolus.  Due to decreased pharyngeal pressure generation, only one third of the bolus entered the esophagus.  The patient attempted a follow-up dry swallow which was delayed and ineffective in clearing pharyngeal residue.  The patient demonstrated modestly improved posterior transfer and pharyngeal clearance with honey-thick liquid.  The patient was observed to have silent laryngeal penetration of pharyngeal residue; shoulder shadow obscured observation of any tracheal aspiration that may occur.  The patient is at significant risk for chronic, ongoing aspiration with risk for chronic aspiration pneumonia.  Recommend NPO with consideration for alternative feeding route.  SLP Visit Diagnosis Dysphagia, oropharyngeal phase (R13.12)  Attention and concentration deficit following --  Frontal lobe and executive function deficit following --  Impact on safety and function Severe aspiration risk;Risk for inadequate nutrition/hydration      CHL IP TREATMENT RECOMMENDATION 06/15/2018  Treatment Recommendations Defer until completion of intrumental exam     Prognosis 06/15/2018  Prognosis for Safe Diet Advancement Guarded  Barriers to Reach Goals Cognitive deficits;Time post onset;Severity of deficits  Barriers/Prognosis Comment --    CHL IP DIET RECOMMENDATION 06/16/2018  SLP Diet Recommendations NPO  Liquid Administration via --  Medication Administration --  Compensations --  Postural Changes --      CHL IP OTHER RECOMMENDATIONS 06/15/2018  Recommended Consults --  Oral Care Recommendations --  Other Recommendations (No Data)  CHL IP FOLLOW UP RECOMMENDATIONS 06/15/2018  Follow up Recommendations (No Data)       CHL IP FREQUENCY AND DURATION 06/15/2018  Speech Therapy Frequency (ACUTE ONLY) (No Data)  Treatment Duration (No Data)           CHL IP ORAL PHASE 06/16/2018  Oral Phase Impaired  Oral - Pudding Teaspoon Impaired mastication;Weak lingual manipulation;Left anterior bolus loss;Right anterior bolus loss;Lingual pumping;Reduced posterior propulsion;Piecemeal swallowing;Delayed oral transit;Decreased bolus cohesion;Premature spillage  Oral - Pudding Cup --  Oral - Honey Teaspoon Left anterior bolus loss;Right anterior bolus loss;Weak lingual manipulation;Lingual pumping;Reduced posterior propulsion;Piecemeal swallowing;Delayed oral transit;Premature spillage  Oral - Honey Cup --  Oral - Nectar Teaspoon Left anterior bolus loss;Right anterior bolus loss;Premature spillage  Oral - Nectar Cup --  Oral - Nectar Straw --  Oral - Thin Teaspoon Left anterior bolus loss;Right anterior bolus loss;Premature spillage  Oral - Thin Cup --  Oral - Thin Straw --  Oral - Puree --  Oral - Mech Soft --  Oral - Regular --  Oral - Multi-Consistency --  Oral - Pill --  Oral Phase - Comment --    CHL IP PHARYNGEAL PHASE 06/16/2018  Pharyngeal Phase Impaired  Pharyngeal- Pudding Teaspoon Reduced pharyngeal peristalsis;Reduced epiglottic inversion;Reduced anterior laryngeal mobility;Reduced laryngeal elevation;Reduced airway/laryngeal closure;Reduced tongue base retraction;Pharyngeal residue - valleculae;Pharyngeal residue - pyriform;Pharyngeal residue - posterior pharnyx  Pharyngeal --  Pharyngeal- Pudding Cup --  Pharyngeal --  Pharyngeal- Honey Teaspoon Reduced pharyngeal peristalsis;Reduced epiglottic inversion;Reduced anterior laryngeal mobility;Reduced laryngeal elevation;Reduced airway/laryngeal closure;Reduced tongue base retraction  Pharyngeal --  Pharyngeal- Honey Cup --  Pharyngeal --  Pharyngeal- Nectar Teaspoon Other (Comment)  Pharyngeal --  Pharyngeal- Nectar Cup --  Pharyngeal --   Pharyngeal- Nectar Straw --  Pharyngeal --  Pharyngeal- Thin Teaspoon Other (Comment)  Pharyngeal --  Pharyngeal- Thin Cup --  Pharyngeal --  Pharyngeal- Thin Straw --  Pharyngeal --  Pharyngeal- Puree --  Pharyngeal --  Pharyngeal- Mechanical Soft --  Pharyngeal --  Pharyngeal- Regular --  Pharyngeal --  Pharyngeal- Multi-consistency --  Pharyngeal --  Pharyngeal- Pill --  Pharyngeal --  Pharyngeal Comment Severe pharyngeal dysphagia     No flowsheet data found.  Dollene Primrose, MS/CCC- SLP  Leandrew Koyanagi 06/16/2018, 12:50 PM

## 2018-06-16 NOTE — Progress Notes (Signed)
As per Dr. Hilton Sinclair he discussed colonoscopy with patient's family and they have refused colonoscopy at this time.  Would recommend interventional radiology consult for feeding tube placement, and Dr.Weiting note states that patient's family has refused this as well.  I will sign off at this time. If patient or family become agreeable to colonoscopy or feeding tube placement please do not hesitate to contact us or interventional radiology as appropriate depending on clinical condition  If we can be of further assistance in any other way, please do not hesitate to reconsult or page [place].

## 2018-06-16 NOTE — Progress Notes (Signed)
Patient ID: Alexander Lara, male   DOB: March 10, 1946, 72 y.o.   MRN: [id number]   [facility] PROGRESS NOTE  Dairon Procter AOZ:[id number] DOB: April 04, 1946 DOA: June 28, 2018 PCP: [facility]  HPI/Subjective: Patient answered a few more yes or no questions today.  Not feeling well.  Objective: Vitals:   06/16/18 0729 06/16/18 1530  BP: (!) 153/83 (!) 151/88  Pulse: 78 77  Resp: 18 18  Temp: 98.1 F (36.7 C) 98 F (36.7 C)  SpO2: 99% 98%    Filed Weights   06/09/18 0206 06/10/18 0338 06/15/18 0345  Weight: 28.9 kg 29.9 kg 31.3 kg    ROS: Review of Systems  Unable to perform ROS: Acuity of condition  Respiratory: Positive for cough. Negative for shortness of breath.   Cardiovascular: Negative for chest pain.  Gastrointestinal: Negative for abdominal pain and vomiting.   Exam: Physical Exam  Constitutional: He appears cachectic.  HENT:  Nose: No mucosal edema.  Mouth/Throat: No oropharyngeal exudate or posterior oropharyngeal edema.  Eyes: Pupils are equal, round, and reactive to light. Conjunctivae and lids are normal.  Neck: No JVD present. Carotid bruit is not present. No edema present. No thyroid mass and no thyromegaly present.  Cardiovascular: S1 normal and S2 normal. An irregularly irregular rhythm present. Exam reveals no gallop.  No murmur heard. Pulses:      Dorsalis pedis pulses are 2+ on the right side, and 2+ on the left side.  Respiratory: No respiratory distress. He has no wheezes. He has no rhonchi. He has no rales.  GI: Soft. Bowel sounds are normal. There is no tenderness.  Musculoskeletal:       Right ankle: He exhibits no swelling.       Left ankle: He exhibits no swelling.  Lymphadenopathy:    He has no cervical adenopathy.  Neurological: He is alert.  Skin: Skin is warm. No rash noted. Nails show no clubbing.  Psychiatric:  Alert but not answering questions      Data Reviewed: Basic Metabolic Panel: Recent Labs  Lab 06/09/18 1737  06/10/18 0432 06/11/18 0600 06/12/18 0456 06/13/18 1057 06/15/18 0557  NA  --  145 148* 148* 146* 148*  K 3.9 3.7 4.2 3.5 3.8 3.2*  CL  --  114* 117* 119* 117* 118*  CO2  --  21* 22 21* 22 25  GLUCOSE  --  103* 98 102* 109* 100*  BUN  --  34* 29* 29* 27* 23  CREATININE  --  0.83 0.91 0.68 0.85 0.65  CALCIUM  --  8.4* 8.9 8.2* 8.4* 7.9*  MG 1.8  --   --   --   --   --    CBC: Recent Labs  Lab 06/10/18 0432 06/11/18 0600 06/12/18 0456 06/13/18 1057 06/15/18 0557  WBC 8.9 6.2 6.8  --  6.5  HGB 8.7* 9.7* 7.9* 9.4* 8.2*  HCT 25.4* 28.7* 23.1*  --  24.3*  MCV 77.6* 78.4* 78.2*  --  79.2*  PLT 274 282 254  --  239   Cardiac Enzymes:   Recent Results (from the past 240 hour(s))  Urine Culture     Status: Abnormal   Collection Time: 06-28-2018  7:51 PM  Result Value Ref Range Status   Specimen Description   Final    URINE, RANDOM Performed at [facility], [address] [place], [place] [address]    Special Requests   Final    NONE Performed at [facility], [phone]  [address] [place], [place] [address]    Culture >=100,000 COLONIES/mL PROTEUS MIRABILIS (A)  Final   Report Status 06/11/2018 FINAL  Final   Organism ID, Bacteria PROTEUS MIRABILIS (A)  Final      Susceptibility   Proteus mirabilis - MIC*    AMPICILLIN <=2 SENSITIVE Sensitive     CEFAZOLIN <=4 SENSITIVE Sensitive     CEFTRIAXONE <=1 SENSITIVE Sensitive     CIPROFLOXACIN <=0.25 SENSITIVE Sensitive     GENTAMICIN <=1 SENSITIVE Sensitive     IMIPENEM 4 SENSITIVE Sensitive     NITROFURANTOIN 128 RESISTANT Resistant     TRIMETH/SULFA <=20 SENSITIVE Sensitive     AMPICILLIN/SULBACTAM <=2 SENSITIVE Sensitive     PIP/TAZO <=4 SENSITIVE Sensitive     * >=100,000 COLONIES/mL PROTEUS MIRABILIS  Blood Culture ID Panel (Reflexed)     Status: Abnormal   Collection Time: 2018/07/04 10:57 PM  Result Value Ref Range Status   Enterococcus species NOT DETECTED NOT DETECTED Final   Listeria  monocytogenes NOT DETECTED NOT DETECTED Final   Staphylococcus species NOT DETECTED NOT DETECTED Final   Staphylococcus aureus (BCID) NOT DETECTED NOT DETECTED Final   Streptococcus species NOT DETECTED NOT DETECTED Final   Streptococcus agalactiae NOT DETECTED NOT DETECTED Final   Streptococcus pneumoniae NOT DETECTED NOT DETECTED Final   Streptococcus pyogenes NOT DETECTED NOT DETECTED Final   Acinetobacter baumannii NOT DETECTED NOT DETECTED Final   Enterobacteriaceae species DETECTED (A) NOT DETECTED Final    Comment: Enterobacteriaceae represent a large family of gram-negative bacteria, not a single organism. CRITICAL RESULT CALLED TO, READ BACK BY AND VERIFIED WITH: JASON ROBBINS AT 1936 06/10/18.PMH    Enterobacter cloacae complex NOT DETECTED NOT DETECTED Final   Escherichia coli NOT DETECTED NOT DETECTED Final   Klebsiella oxytoca NOT DETECTED NOT DETECTED Final   Klebsiella pneumoniae NOT DETECTED NOT DETECTED Final   Proteus species DETECTED (A) NOT DETECTED Final    Comment: CRITICAL RESULT CALLED TO, READ BACK BY AND VERIFIED WITH: JASON ROBBINS AT 1936 06/10/18.PMH    Serratia marcescens NOT DETECTED NOT DETECTED Final   Carbapenem resistance NOT DETECTED NOT DETECTED Final   Haemophilus influenzae NOT DETECTED NOT DETECTED Final   Neisseria meningitidis NOT DETECTED NOT DETECTED Final   Pseudomonas aeruginosa NOT DETECTED NOT DETECTED Final   Candida albicans NOT DETECTED NOT DETECTED Final   Candida glabrata NOT DETECTED NOT DETECTED Final   Candida krusei NOT DETECTED NOT DETECTED Final   Candida parapsilosis NOT DETECTED NOT DETECTED Final   Candida tropicalis NOT DETECTED NOT DETECTED Final    Comment: Performed at [facility], [address] [place], [place] [address]  Blood culture (routine x 2)     Status: Abnormal   Collection Time: 07/04/18 11:40 PM  Result Value Ref Range Status   Specimen Description   Final    BLOOD LEFT ANTECUBITAL Performed  at [facility], [address] [place], [place] [address]    Special Requests   Final    BOTTLES DRAWN AEROBIC AND ANAEROBIC Blood Culture results may not be optimal due to an excessive volume of blood received in culture bottles Performed at [facility], [address] [place], [place] [address]    Culture  Setup Time   Final    GRAM POSITIVE RODS ANAEROBIC BOTTLE ONLY CRITICAL VALUE NOTED.  VALUE IS CONSISTENT WITH PREVIOUSLY REPORTED AND CALLED VALUE. GRAM NEGATIVE RODS AEROBIC BOTTLE ONLY CRITICAL RESULT CALLED TO, READ BACK BY AND VERIFIED  WITH: JASON ROBBINS AT 5284 06/10/18.PMH Performed at [facility], [address] [place], [place] [address]    Culture (A)  Final    PROTEUS MIRABILIS SUSCEPTIBILITIES PERFORMED ON PREVIOUS CULTURE WITHIN THE LAST 5 DAYS. CLOSTRIDIUM SPECIES Standardized susceptibility testing for this organism is not available. Performed at [facility], [address], [place], [place] [address]    Report Status 06/13/2018 FINAL  Final  Blood culture (routine x 2)     Status: Abnormal   Collection Time: 2018/06/09 11:40 PM  Result Value Ref Range Status   Specimen Description   Final    BLOOD RIGHT ANTECUBITAL Performed at [facility], [address] [place], [place] [address]    Special Requests   Final    BOTTLES DRAWN AEROBIC AND ANAEROBIC Blood Culture results may not be optimal due to an excessive volume of blood received in culture bottles Performed at [facility], [address] [place], [place] [address]    Culture  Setup Time   Final    GRAM POSITIVE RODS IN BOTH AEROBIC AND ANAEROBIC BOTTLES CRITICAL RESULT CALLED TO, READ BACK BY AND VERIFIED WITH: HANK ZOMPA 06/09/18 1142 REC    Culture (A)  Final    PROTEUS MIRABILIS CLOSTRIDIUM SPECIES Standardized susceptibility testing for this organism is not available. Performed at [facility], [address] [place],  [place] [address]    Report Status 06/13/2018 FINAL  Final   Organism ID, Bacteria PROTEUS MIRABILIS  Final      Susceptibility   Proteus mirabilis - MIC*    AMPICILLIN <=2 SENSITIVE Sensitive     CEFAZOLIN <=4 SENSITIVE Sensitive     CEFEPIME <=1 SENSITIVE Sensitive     CEFTAZIDIME <=1 SENSITIVE Sensitive     CEFTRIAXONE <=1 SENSITIVE Sensitive     CIPROFLOXACIN <=0.25 SENSITIVE Sensitive     GENTAMICIN <=1 SENSITIVE Sensitive     IMIPENEM 2 SENSITIVE Sensitive     TRIMETH/SULFA <=20 SENSITIVE Sensitive     AMPICILLIN/SULBACTAM <=2 SENSITIVE Sensitive     PIP/TAZO <=4 SENSITIVE Sensitive     * PROTEUS MIRABILIS  Blood Culture ID Panel (Reflexed)     Status: None   Collection Time: 06-09-18 11:40 PM  Result Value Ref Range Status   Enterococcus species NOT DETECTED NOT DETECTED Final   Listeria monocytogenes NOT DETECTED NOT DETECTED Final   Staphylococcus species NOT DETECTED NOT DETECTED Final   Staphylococcus aureus (BCID) NOT DETECTED NOT DETECTED Final   Streptococcus species NOT DETECTED NOT DETECTED Final   Streptococcus agalactiae NOT DETECTED NOT DETECTED Final   Streptococcus pneumoniae NOT DETECTED NOT DETECTED Final   Streptococcus pyogenes NOT DETECTED NOT DETECTED Final   Acinetobacter baumannii NOT DETECTED NOT DETECTED Final   Enterobacteriaceae species NOT DETECTED NOT DETECTED Final   Enterobacter cloacae complex NOT DETECTED NOT DETECTED Final   Escherichia coli NOT DETECTED NOT DETECTED Final   Klebsiella oxytoca NOT DETECTED NOT DETECTED Final   Klebsiella pneumoniae NOT DETECTED NOT DETECTED Final   Proteus species NOT DETECTED NOT DETECTED Final   Serratia marcescens NOT DETECTED NOT DETECTED Final   Haemophilus influenzae NOT DETECTED NOT DETECTED Final   Neisseria meningitidis NOT DETECTED NOT DETECTED Final   Pseudomonas aeruginosa NOT DETECTED NOT DETECTED Final   Candida albicans NOT DETECTED NOT DETECTED Final   Candida glabrata NOT DETECTED NOT DETECTED  Final   Candida krusei NOT DETECTED NOT DETECTED Final   Candida parapsilosis NOT DETECTED NOT  DETECTED Final   Candida tropicalis NOT DETECTED NOT DETECTED Final    Comment: Performed at [facility], [address] [place], [place] [address]     Studies: Ct Head Wo Contrast  Result Date: 06/16/2018 CLINICAL DATA:  Altered mental status, history of seizures, failure to thrive, former smoker, history of prior subdural hematoma EXAM: CT HEAD WITHOUT CONTRAST TECHNIQUE: Contiguous axial images were obtained from the base of the skull through the vertex without intravenous contrast. Sagittal and coronal MPR images reconstructed from axial data set. COMPARISON:  None FINDINGS: Brain: Generalized cerebral and cerebellar atrophy. Normal ventricular morphology. No midline shift or mass effect. Small vessel chronic ischemic changes of deep cerebral white matter. 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 No intracranial hemorrhage, mass lesion, evidence of acute infarction, or extra-axial fluid collection. Vascular: Mild atherosclerotic calcification of internal carotid arteries bilaterally at skull base. No hyperdense vessels. Skull: Demineralized but intact Sinuses/Orbits: Clear paranasal sinuses. Minimal fluid within a few RIGHT mastoid air cells. Other: N/A IMPRESSION: Atrophy with small vessel chronic ischemic changes of deep cerebral white matter. 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 No acute intracranial abnormalities. Electronically Signed   By: Ulyses Southward M.D.   On: 06/16/2018 15:32    Scheduled Meds: . feeding supplement (NEPRO CARB STEADY)  237 mL Oral TID BM  . finasteride  5 mg Oral Daily  . levETIRAcetam  500 mg Oral BID  . mouth rinse  15 mL Mouth Rinse BID  . Melatonin  5 mg Oral QHS  . metoprolol tartrate  25 mg Oral BID  . multivitamin with minerals  1 tablet Oral Daily  . sodium chloride flush  10-40 mL Intracatheter Q12H  . sodium chloride flush  3 mL Intravenous Q12H    Continuous Infusions: . sodium chloride Stopped (06/16/18 0217)  . ampicillin-sulbactam (UNASYN) IV 3 g (06/16/18 1535)  . dextrose 5 % and 0.9% NaCl Stopped (06/16/18 1540)    Assessment/Plan:   1. Proteus sepsis from indwelling Foley catheter.  Blood cultures and urine cultures positive.  Foley catheter changed.  This will have to be changed every 4 weeks.  Switched antibiotics to Unasyn which would cover Proteus and Clostridium 2. Clostridium species also and blood culture.  IV Unasyn which would cover Clostridium species also.  Family decided against colonoscopy at this point. 3. Failed swallow evaluation.  Unable to reach sister on the phone.  Did reach fianc who also spoke with the patient's son about the PEG tube.  They have decided to go ahead with the PEG tube.  Spoke with interventional radiology and they will not be able to do at this point until next week.  Will likely have to place an NG tube tomorrow morning for feeding. 4. 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  CT scan not showing any signs of malignancy at this point.  Unasyn will cover aspiration 5. Atrial fibrillation with rapid ventricular response.  Metoprolol changed to IV as needed 6. History of seizures on Keppra switch to IV 7. Drop in hemoglobin.  Hemoglobin 8.2 today.  Continue to monitor. 8. Failure to thrive, cachexia and malnutrition.  Overall prognosis is poor.  Patient is a DNR.   9. BPH.  Hold oral meds  Code Status:     Code Status Orders  (From admission, onward)         Start     Ordered   06/10/18 1120  Do not attempt resuscitation (  DNR)  Continuous    Question Answer Comment  In the event of cardiac or respiratory ARREST Do not call a "code blue"   In the event of cardiac or respiratory ARREST Do not perform Intubation, CPR, defibrillation or ACLS   In the event of cardiac or respiratory ARREST Use medication by any route, position, wound care, and  other measures to relive pain and suffering. May use oxygen, suction and manual treatment of airway obstruction as needed for comfort.      06/10/18 1121        Code Status History    Date Active Date Inactive Code Status Order ID Comments User Context   06/09/2018 0209 06/10/2018 1121 DNR [id number]  Barbaraann Rondo, MD ED   05/15/2018 1120 05/18/2018 2303 Full Code [id number]  Auburn Bilberry, MD Inpatient   05/15/2018 0125 05/15/2018 1120 Full Code [id number]  Cammy Copa, MD Inpatient    Advance Directive Documentation     Most Recent Value  Type of Advance Directive  Healthcare Power of Attorney  Pre-existing out of facility DNR order (yellow form or pink MOST form)  -  "MOST" Form in Place?  -     Disposition Plan: Will not be able to get PEG tube until next week  Antibiotics:  Unasyn  Time spent: 25 minutes.  Spoke with fianc on the phone today.  Sister did not pick up the phone when I called her.  Darrol Brandenburg [employer]

## 2018-06-16 NOTE — Clinical Social Work Maternal (Addendum)
CSW received phone call from patient's sister Alexander Lara [phone] asking for an update on placement options for a different SNF.  CSW informed her that [facility] has offered a bed, and [facility] in [place] has offered a bed.  Patient's sister, asked if CSW will update patient's fiance with bed offers, CSW attempted to call Alexander Lara [phone], and left a message awaiting for call back.  Patient's sister stated she has her own health issues, and would like fiance and son to make decisions on patient's care.  Per dietician's note patient's fiance would like a feeding tube and he will be getting NG tube  CSW to continue to follow patient's progress throughout discharge planning.  Alexander Lara. Alexander Lara, MSW, Theresia Majors [phone]  06/16/2018 7:53 PM

## 2018-06-16 NOTE — Progress Notes (Signed)
Nutrition Brief Note  Met with pt's fiance in room today. Discussed tube placement and tube feedings with her and answered all of her questions. Family wishes to proceed with IR G-tube placement. Tube unable to placed until next week. Spoke with MD, plan for NGT placement tomorrow and nutrition support until G-tube can be placed. Please consult RD once NGT in place.   Koleen Distance MS, RD, LDN Pager #- [phone] Office#- [phone] After Hours Pager: [phone]

## 2018-06-17 ENCOUNTER — Inpatient Hospital Stay: Payer: Medicare Other

## 2018-06-17 ENCOUNTER — Other Ambulatory Visit: Payer: Self-pay | Admitting: Student

## 2018-06-17 MED ORDER — ACETAMINOPHEN 325 MG PO TABS
650.0000 mg | ORAL_TABLET | Freq: Four times a day (QID) | ORAL | Status: DC | PRN
  Administered 2018-06-18: 650 mg via ORAL
  Filled 2018-06-17: qty 2

## 2018-06-17 MED ORDER — VITAMIN C 500 MG PO TABS
250.0000 mg | ORAL_TABLET | Freq: Two times a day (BID) | ORAL | Status: DC
  Administered 2018-06-18 – 2018-06-20 (×7): 250 mg
  Filled 2018-06-17: qty 0.5
  Filled 2018-06-17 (×7): qty 1

## 2018-06-17 MED ORDER — FREE WATER
30.0000 mL | Status: DC
  Administered 2018-06-17 – 2018-06-20 (×15): 30 mL

## 2018-06-17 MED ORDER — OSMOLITE 1.2 CAL PO LIQD
1000.0000 mL | ORAL | Status: DC
  Administered 2018-06-17: 1000 mL

## 2018-06-17 MED ORDER — SODIUM CHLORIDE 0.9 % IV SOLN
INTRAVENOUS | Status: DC
  Administered 2018-06-20 (×2): via INTRAVENOUS

## 2018-06-17 MED ORDER — ADULT MULTIVITAMIN LIQUID CH
15.0000 mL | Freq: Every day | ORAL | Status: DC
  Administered 2018-06-18 – 2018-06-20 (×3): 15 mL
  Filled 2018-06-17 (×4): qty 15

## 2018-06-17 MED ORDER — KCL IN DEXTROSE-NACL 20-5-0.9 MEQ/L-%-% IV SOLN
INTRAVENOUS | Status: DC
  Administered 2018-06-17 – 2018-06-21 (×4): via INTRAVENOUS
  Filled 2018-06-17 (×6): qty 1000

## 2018-06-17 NOTE — Progress Notes (Signed)
Nutrition Follow-up  DOCUMENTATION CODES:   Severe malnutrition in context of chronic illness  INTERVENTION:   Tube feeding via Dobbhoff NGT once placement confirmed: - Begin Osmolite 1.2 @ 10 ml/hr and increase by 10 ml q 12 hours until goal rate of 55 ml/hr is reached (1320 ml/day)  Tube feeding regimen at goal rate provides 1584 kcal, 73 grams of protein, and 1082 ml of H2O (100% of needs).  Monitor magnesium, potassium, and phosphorus daily for at least 3 days, MD to replete as needed, as pt is at risk for refeeding syndrome given severe protein-calorie malnutrition.  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  Ongoing, being addressed via initiation of TF  GOAL:   Patient will meet greater than or equal to 90% of their needs  Will be met with TF at goal rate  MONITOR:   PO intake, Weight trends, Labs, Supplement acceptance  REASON FOR ASSESSMENT:   Malnutrition Screening Tool    ASSESSMENT:   Pt is resident of [facility] admitted 9/25 for urinary retention w/ foley catheter in place and failure to thrive. Previous admit 8/31PMH: emphysema, seizures, subdural heamtoma.  Due to severe aspiration risk and MBS results, SLP recommends NPO status. Prior to NPO status, meal completion averaging 25%.  Plan is for Dobbhoff NGT placement this AM. Noted plan for IR G-tube placement next week. Pt will receive tube feedings via NGT until G-tube is placed and medically cleared for use.  Discussed plan with RN who reports NGT placement likely to occur within the next hour.  Attempted to speak with pt at bedside. Pt resting quietly and would only open eyes to RD voice.  Medications reviewed and include: MVI with minerals daily, IV antibiotics, Keppra, D5 and NaCl with KCl 20 mEq/L @ 40 ml/hr  Labs reviewed: sodium 148 (H), potassium 3.2 (L), hemoglobin 8.2 (L), HCT 24.3 (L)  Diet  Order:   Diet Order            Diet NPO time specified  Diet effective now              EDUCATION NEEDS:   No education needs have been identified at this time  Skin:  Skin Assessment: Reviewed RN Assessment (bilateral arm ecchymosis)  Last BM:  10/2 - large type 5  Height:   Ht Readings from Last 1 Encounters:  06/12/2018 _0  (1.803 m)    Weight:   Wt Readings from Last 1 Encounters:  06/15/18 31.3 kg    Ideal Body Weight:  78.2 kg  BMI:  Body mass index is 9.62 kg/m.  Estimated Nutritional Needs:   Kcal:  1500-1800 kcal/day   Protein:  60-75 grams  Fluid:  1.0 L/day     Gaynell Face, MS, RD, LDN Inpatient Clinical Dietitian Pager: [phone] Weekend/After Hours: [phone]

## 2018-06-17 NOTE — Care Management Important Message (Signed)
Copy of IM left with patient in room. 

## 2018-06-17 NOTE — Plan of Care (Signed)
  Problem: Pain Managment: Goal: General experience of comfort will improve Outcome: Progressing   Problem: Safety: Goal: Ability to remain free from injury will improve Outcome: Progressing   

## 2018-06-17 NOTE — Progress Notes (Signed)
Pharmacy Antibiotic Note  Alexander Lara is a 72 y.o. male admitted on 06/07/2018 with sepsis.  Pharmacy has been consulted for Unasyn dosing.  Plan: Now with BCx proteus and clostridium. Unasyn 3 gm IV Q6H to cover both. PEG placement pending.   Height: 5\' 11"  (180.3 cm) Weight: 69 lb (31.3 kg) IBW/kg (Calculated) : 75.3  Temp (24hrs), Avg:98.1 F (36.7 C), Min:97.6 F (36.4 C), Max:98.6 F (37 C)  Recent Labs  Lab 06/11/18 0600 06/12/18 0456 06/13/18 1057 06/15/18 0557  WBC 6.2 6.8  --  6.5  CREATININE 0.91 0.68 0.85 0.65    Estimated Creatinine Clearance: 36.4 mL/min (by C-G formula based on SCr of 0.65 mg/dL).    No Known Allergies  Antimicrobials this admission:   Dose adjustments this admission:   Microbiology results:  BCx: proteus and clostridium  UCx:  proteus  Sputum:    MRSA PCR:   Thank you for allowing pharmacy to be a part of this patient's care.  Carola Frost, Pharm.D., BCPS Clinical Pharmacist 06/17/2018 1:27 PM

## 2018-06-17 NOTE — Progress Notes (Signed)
Patient ID: Alexander Lara, male   DOB: 1946-07-26, 72 y.o.   MRN: [id number]   [facility] PROGRESS NOTE  Sreekar Broyhill NWG:[id number] DOB: 07/17/1946 DOA: 06/06/2018 PCP: [facility]  HPI/Subjective: Patient more lethargic today.  Seen after first Dobbhoff tube placed.  He pulled out the Dobbhoff tube and a new one was placed.  Just confirm placement of the second tube.  Objective: Vitals:   06/17/18 0341 06/17/18 0816  BP: (!) 156/97 (!) 156/88  Pulse: 77 77  Resp: 18 17  Temp: 98.6 F (37 C) 98.3 F (36.8 C)  SpO2: 98% 97%    Filed Weights   06/09/18 0206 06/10/18 0338 06/15/18 0345  Weight: 28.9 kg 29.9 kg 31.3 kg    ROS: Review of Systems  Unable to perform ROS: Acuity of condition  Respiratory: Positive for cough. Negative for shortness of breath.   Cardiovascular: Negative for chest pain.  Gastrointestinal: Negative for abdominal pain and vomiting.   Exam: Physical Exam  Constitutional: He appears cachectic.  HENT:  Nose: No mucosal edema.  Mouth/Throat: No oropharyngeal exudate or posterior oropharyngeal edema.  Eyes: Pupils are equal, round, and reactive to light. Conjunctivae and lids are normal.  Neck: No JVD present. Carotid bruit is not present. No edema present. No thyroid mass and no thyromegaly present.  Cardiovascular: S1 normal and S2 normal. An irregularly irregular rhythm present. Exam reveals no gallop.  No murmur heard. Pulses:      Dorsalis pedis pulses are 2+ on the right side, and 2+ on the left side.  Respiratory: No respiratory distress. He has no wheezes. He has no rhonchi. He has no rales.  GI: Soft. Bowel sounds are normal. There is no tenderness.  Musculoskeletal:       Right ankle: He exhibits no swelling.       Left ankle: He exhibits no swelling.  Lymphadenopathy:    He has no cervical adenopathy.  Neurological: He is alert.  Skin: Skin is warm. No rash noted. Nails show no clubbing.  Psychiatric:  Alert but not  answering questions      Data Reviewed: Basic Metabolic Panel: Recent Labs  Lab 06/11/18 0600 06/12/18 0456 06/13/18 1057 06/15/18 0557  NA 148* 148* 146* 148*  K 4.2 3.5 3.8 3.2*  CL 117* 119* 117* 118*  CO2 22 21* 22 25  GLUCOSE 98 102* 109* 100*  BUN 29* 29* 27* 23  CREATININE 0.91 0.68 0.85 0.65  CALCIUM 8.9 8.2* 8.4* 7.9*   CBC: Recent Labs  Lab 06/11/18 0600 06/12/18 0456 06/13/18 1057 06/15/18 0557  WBC 6.2 6.8  --  6.5  HGB 9.7* 7.9* 9.4* 8.2*  HCT 28.7* 23.1*  --  24.3*  MCV 78.4* 78.2*  --  79.2*  PLT 282 254  --  239   Cardiac Enzymes:   Recent Results (from the past 240 hour(s))  Urine Culture     Status: Abnormal   Collection Time: 06/10/2018  7:51 PM  Result Value Ref Range Status   Specimen Description   Final    URINE, RANDOM Performed at [facility], [address], [place], [place] [address]    Special Requests   Final    NONE Performed at [facility], [address] [place], [place] [address]    Culture >=100,000 COLONIES/mL PROTEUS MIRABILIS (A)  Final   Report Status 06/11/2018 FINAL  Final   Organism ID, Bacteria PROTEUS MIRABILIS (A)  Final      Susceptibility  Proteus mirabilis - MIC*    AMPICILLIN <=2 SENSITIVE Sensitive     CEFAZOLIN <=4 SENSITIVE Sensitive     CEFTRIAXONE <=1 SENSITIVE Sensitive     CIPROFLOXACIN <=0.25 SENSITIVE Sensitive     GENTAMICIN <=1 SENSITIVE Sensitive     IMIPENEM 4 SENSITIVE Sensitive     NITROFURANTOIN 128 RESISTANT Resistant     TRIMETH/SULFA <=20 SENSITIVE Sensitive     AMPICILLIN/SULBACTAM <=2 SENSITIVE Sensitive     PIP/TAZO <=4 SENSITIVE Sensitive     * >=100,000 COLONIES/mL PROTEUS MIRABILIS  Blood Culture ID Panel (Reflexed)     Status: Abnormal   Collection Time: 05/29/2018 10:57 PM  Result Value Ref Range Status   Enterococcus species NOT DETECTED NOT DETECTED Final   Listeria monocytogenes NOT DETECTED NOT DETECTED Final   Staphylococcus species NOT DETECTED NOT  DETECTED Final   Staphylococcus aureus (BCID) NOT DETECTED NOT DETECTED Final   Streptococcus species NOT DETECTED NOT DETECTED Final   Streptococcus agalactiae NOT DETECTED NOT DETECTED Final   Streptococcus pneumoniae NOT DETECTED NOT DETECTED Final   Streptococcus pyogenes NOT DETECTED NOT DETECTED Final   Acinetobacter baumannii NOT DETECTED NOT DETECTED Final   Enterobacteriaceae species DETECTED (A) NOT DETECTED Final    Comment: Enterobacteriaceae represent a large family of gram-negative bacteria, not a single organism. CRITICAL RESULT CALLED TO, READ BACK BY AND VERIFIED WITH: JASON ROBBINS AT 1936 06/10/18.PMH    Enterobacter cloacae complex NOT DETECTED NOT DETECTED Final   Escherichia coli NOT DETECTED NOT DETECTED Final   Klebsiella oxytoca NOT DETECTED NOT DETECTED Final   Klebsiella pneumoniae NOT DETECTED NOT DETECTED Final   Proteus species DETECTED (A) NOT DETECTED Final    Comment: CRITICAL RESULT CALLED TO, READ BACK BY AND VERIFIED WITH: JASON ROBBINS AT 1936 06/10/18.PMH    Serratia marcescens NOT DETECTED NOT DETECTED Final   Carbapenem resistance NOT DETECTED NOT DETECTED Final   Haemophilus influenzae NOT DETECTED NOT DETECTED Final   Neisseria meningitidis NOT DETECTED NOT DETECTED Final   Pseudomonas aeruginosa NOT DETECTED NOT DETECTED Final   Candida albicans NOT DETECTED NOT DETECTED Final   Candida glabrata NOT DETECTED NOT DETECTED Final   Candida krusei NOT DETECTED NOT DETECTED Final   Candida parapsilosis NOT DETECTED NOT DETECTED Final   Candida tropicalis NOT DETECTED NOT DETECTED Final    Comment: Performed at [facility], [address] [place], [place] [address]  Blood culture (routine x 2)     Status: Abnormal   Collection Time: 05/18/2018 11:40 PM  Result Value Ref Range Status   Specimen Description   Final    BLOOD LEFT ANTECUBITAL Performed at [facility], [address] [place], [place] [address]    Special  Requests   Final    BOTTLES DRAWN AEROBIC AND ANAEROBIC Blood Culture results may not be optimal due to an excessive volume of blood received in culture bottles Performed at [facility], [address], [place], [place] [address]    Culture  Setup Time   Final    GRAM POSITIVE RODS ANAEROBIC BOTTLE ONLY CRITICAL VALUE NOTED.  VALUE IS CONSISTENT WITH PREVIOUSLY REPORTED AND CALLED VALUE. GRAM NEGATIVE RODS AEROBIC BOTTLE ONLY CRITICAL RESULT CALLED TO, READ BACK BY AND VERIFIED WITH: JASON ROBBINS AT 1936 06/10/18.PMH Performed at [facility], [address] [place], [place] [address]    Culture (A)  Final    PROTEUS MIRABILIS SUSCEPTIBILITIES PERFORMED ON PREVIOUS CULTURE WITHIN THE LAST 5 DAYS. CLOSTRIDIUM SPECIES Standardized  susceptibility testing for this organism is not available. Performed at [facility], [address], [place], [place] [address]    Report Status 06/13/2018 FINAL  Final  Blood culture (routine x 2)     Status: Abnormal   Collection Time: July 01, 2018 11:40 PM  Result Value Ref Range Status   Specimen Description   Final    BLOOD RIGHT ANTECUBITAL Performed at [facility], [address] [place], [place] [address]    Special Requests   Final    BOTTLES DRAWN AEROBIC AND ANAEROBIC Blood Culture results may not be optimal due to an excessive volume of blood received in culture bottles Performed at [facility], [address], [place], [place] [address]    Culture  Setup Time   Final    GRAM POSITIVE RODS IN BOTH AEROBIC AND ANAEROBIC BOTTLES CRITICAL RESULT CALLED TO, READ BACK BY AND VERIFIED WITH: HANK ZOMPA 06/09/18 1142 REC    Culture (A)  Final    PROTEUS MIRABILIS CLOSTRIDIUM SPECIES Standardized susceptibility testing for this organism is not available. Performed at [facility], [address], [place], [place] [address]    Report Status 06/13/2018 FINAL  Final   Organism ID, Bacteria  PROTEUS MIRABILIS  Final      Susceptibility   Proteus mirabilis - MIC*    AMPICILLIN <=2 SENSITIVE Sensitive     CEFAZOLIN <=4 SENSITIVE Sensitive     CEFEPIME <=1 SENSITIVE Sensitive     CEFTAZIDIME <=1 SENSITIVE Sensitive     CEFTRIAXONE <=1 SENSITIVE Sensitive     CIPROFLOXACIN <=0.25 SENSITIVE Sensitive     GENTAMICIN <=1 SENSITIVE Sensitive     IMIPENEM 2 SENSITIVE Sensitive     TRIMETH/SULFA <=20 SENSITIVE Sensitive     AMPICILLIN/SULBACTAM <=2 SENSITIVE Sensitive     PIP/TAZO <=4 SENSITIVE Sensitive     * PROTEUS MIRABILIS  Blood Culture ID Panel (Reflexed)     Status: None   Collection Time: July 01, 2018 11:40 PM  Result Value Ref Range Status   Enterococcus species NOT DETECTED NOT DETECTED Final   Listeria monocytogenes NOT DETECTED NOT DETECTED Final   Staphylococcus species NOT DETECTED NOT DETECTED Final   Staphylococcus aureus (BCID) NOT DETECTED NOT DETECTED Final   Streptococcus species NOT DETECTED NOT DETECTED Final   Streptococcus agalactiae NOT DETECTED NOT DETECTED Final   Streptococcus pneumoniae NOT DETECTED NOT DETECTED Final   Streptococcus pyogenes NOT DETECTED NOT DETECTED Final   Acinetobacter baumannii NOT DETECTED NOT DETECTED Final   Enterobacteriaceae species NOT DETECTED NOT DETECTED Final   Enterobacter cloacae complex NOT DETECTED NOT DETECTED Final   Escherichia coli NOT DETECTED NOT DETECTED Final   Klebsiella oxytoca NOT DETECTED NOT DETECTED Final   Klebsiella pneumoniae NOT DETECTED NOT DETECTED Final   Proteus species NOT DETECTED NOT DETECTED Final   Serratia marcescens NOT DETECTED NOT DETECTED Final   Haemophilus influenzae NOT DETECTED NOT DETECTED Final   Neisseria meningitidis NOT DETECTED NOT DETECTED Final   Pseudomonas aeruginosa NOT DETECTED NOT DETECTED Final   Candida albicans NOT DETECTED NOT DETECTED Final   Candida glabrata NOT DETECTED NOT DETECTED Final   Candida krusei NOT DETECTED NOT DETECTED Final   Candida  parapsilosis NOT DETECTED NOT DETECTED Final   Candida tropicalis NOT DETECTED NOT DETECTED Final    Comment: Performed at [facility], [address] [place], [place] [address]     Studies: Dg Abd 1 View  Result Date: 06/17/2018 CLINICAL DATA:  NG tube placement. EXAM: ABDOMEN - 1 VIEW COMPARISON:  06/17/2018. FINDINGS: Dobbhoff tube noted with its tip now noted over the distal stomach. No bowel distention. Multifocal bilateral subsegmental atelectasis/pulmonary mild infiltrates. IMPRESSION: 1. Dobbhoff tube noted with its tip now noted over the distal stomach. 2. Multifocal mild bilateral subsegmental atelectasis/pulmonary infiltrates. Electronically Signed   By: Maisie Fus  Register   On: 06/17/2018 15:25   Dg Abd 1 View  Result Date: 06/17/2018 CLINICAL DATA:  Feeding tube placement EXAM: ABDOMEN - 1 VIEW COMPARISON:  CT abdomen 06/13/2018 FINDINGS: The feeding tube extends into the stomach and is facing cephalad in the stomach fundus region. Tubing projecting over the right chest likely represents a PICC line. Emphysema noted.  Compression fractures observed at T12 and L2. IMPRESSION: 1. Feeding tube tip is in the stomach fundus, oriented cephalad. 2.  Emphysema (ICD10-J43.9). 3. Compression fractures at T12 and L2. Electronically Signed   By: Gaylyn Rong M.D.   On: 06/17/2018 12:39   Ct Head Wo Contrast  Result Date: 06/16/2018 CLINICAL DATA:  Altered mental status, history of seizures, failure to thrive, former smoker, history of prior subdural hematoma EXAM: CT HEAD WITHOUT CONTRAST TECHNIQUE: Contiguous axial images were obtained from the base of the skull through the vertex without intravenous contrast. Sagittal and coronal MPR images reconstructed from axial data set. COMPARISON:  None FINDINGS: Brain: Generalized cerebral and cerebellar atrophy. Normal ventricular morphology. No midline shift or mass effect. Small vessel chronic ischemic changes of deep cerebral white  matter. 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 No intracranial hemorrhage, mass lesion, evidence of acute infarction, or extra-axial fluid collection. Vascular: Mild atherosclerotic calcification of internal carotid arteries bilaterally at skull base. No hyperdense vessels. Skull: Demineralized but intact Sinuses/Orbits: Clear paranasal sinuses. Minimal fluid within a few RIGHT mastoid air cells. Other: N/A IMPRESSION: Atrophy with small vessel chronic ischemic changes of deep cerebral white matter. 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 No acute intracranial abnormalities. Electronically Signed   By: Ulyses Southward M.D.   On: 06/16/2018 15:32    Scheduled Meds: . mouth rinse  15 mL Mouth Rinse BID  . multivitamin with minerals  1 tablet Oral Daily  . sodium chloride flush  10-40 mL Intracatheter Q12H  . sodium chloride flush  3 mL Intravenous Q12H   Continuous Infusions: . sodium chloride Stopped (06/16/18 0217)  . ampicillin-sulbactam (UNASYN) IV 3 g (06/17/18 1446)  . dextrose 5 % and 0.9 % NaCl with KCl 20 mEq/L 40 mL/hr at 06/17/18 1102  . feeding supplement (OSMOLITE 1.2 CAL)    . levETIRAcetam 500 mg (06/17/18 1011)    Assessment/Plan:   1. Proteus sepsis from indwelling Foley catheter.  Blood cultures and urine cultures positive.  Foley catheter changed.  This will have to be changed every 4 weeks.  Switched antibiotics to Unasyn which would cover Proteus and Clostridium 2. Clostridium species also and blood culture.  IV Unasyn which would cover Clostridium species also.  Family decided against colonoscopy at this point.  This organism can go along with a colon cancer. 3. Failed swallow evaluation.  Dobbhoff tube placed today for a second time after he pulled out the first 1.  Okay to start feeding.  Interventional radiology will do a gastrostomy tube next week. 4. 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  CT scan not showing any signs of  malignancy at this point.  Unasyn will cover aspiration 5. Atrial fibrillation with rapid ventricular  response.  Metoprolol changed to IV as needed 6. History of seizures on Keppra switch to IV 7. Drop in hemoglobin.  Continue to monitor closely. 8. Failure to thrive, cachexia and malnutrition.  Overall prognosis is poor.  Patient is a DNR.   9. BPH.  Hold oral meds  Code Status:     Code Status Orders  (From admission, onward)         Start     Ordered   06/10/18 1120  Do not attempt resuscitation (DNR)  Continuous    Question Answer Comment  In the event of cardiac or respiratory ARREST Do not call a "code blue"   In the event of cardiac or respiratory ARREST Do not perform Intubation, CPR, defibrillation or ACLS   In the event of cardiac or respiratory ARREST Use medication by any route, position, wound care, and other measures to relive pain and suffering. May use oxygen, suction and manual treatment of airway obstruction as needed for comfort.      06/10/18 1121        Code Status History    Date Active Date Inactive Code Status Order ID Comments User Context   06/09/2018 0209 06/10/2018 1121 DNR [id number]  Barbaraann Rondo, MD ED   05/15/2018 1120 05/18/2018 2303 Full Code [id number]  Auburn Bilberry, MD Inpatient   05/15/2018 0125 05/15/2018 1120 Full Code [id number]  Cammy Copa, MD Inpatient    Advance Directive Documentation     Most Recent Value  Type of Advance Directive  Healthcare Power of Attorney  Pre-existing out of facility DNR order (yellow form or pink MOST form)  -  "MOST" Form in Place?  -     Disposition Plan: Will not be able to get PEG tube until next week  Antibiotics:  Unasyn  Time spent: 24 minutes.  Spoke with fianc yesterday on the phone who wanted to proceed with feeding tube placement.  Ladajah Soltys [employer]

## 2018-06-18 LAB — BASIC METABOLIC PANEL
ANION GAP: 6 (ref 5–15)
BUN: 13 mg/dL (ref 8–23)
CO2: 22 mmol/L (ref 22–32)
Calcium: 7.7 mg/dL — ABNORMAL LOW (ref 8.9–10.3)
Chloride: 118 mmol/L — ABNORMAL HIGH (ref 98–111)
Creatinine, Ser: 0.63 mg/dL (ref 0.61–1.24)
GFR calc non Af Amer: >60 mL/min (ref >60)
GLUCOSE: 117 mg/dL — AB (ref 70–99)
Potassium: 2.8 mmol/L — ABNORMAL LOW (ref 3.5–5.1)
Sodium: 146 mmol/L — ABNORMAL HIGH (ref 135–145)

## 2018-06-18 LAB — CBC
HEMATOCRIT: 26.7 % — AB (ref 40.0–52.0)
Hemoglobin: 8.8 g/dL — ABNORMAL LOW (ref 13.0–18.0)
MCH: 25.9 pg — ABNORMAL LOW (ref 26.0–34.0)
MCHC: 33.2 g/dL (ref 32.0–36.0)
MCV: 78.1 fL — AB (ref 80.0–100.0)
Platelets: 256 10*3/uL (ref 150–440)
RBC: 3.42 MIL/uL — AB (ref 4.40–5.90)
RDW: 26.1 % — ABNORMAL HIGH (ref 11.5–14.5)
WBC: 8.2 10*3/uL (ref 3.8–10.6)

## 2018-06-18 LAB — MAGNESIUM: MAGNESIUM: 1.8 mg/dL (ref 1.7–2.4)

## 2018-06-18 LAB — PHOSPHORUS: Phosphorus: 1.7 mg/dL — ABNORMAL LOW (ref 2.5–4.6)

## 2018-06-18 LAB — POTASSIUM: POTASSIUM: 3.2 mmol/L — AB (ref 3.5–5.1)

## 2018-06-18 MED ORDER — OSMOLITE 1.2 CAL PO LIQD
1000.0000 mL | ORAL | Status: AC
  Administered 2018-06-18: 1000 mL

## 2018-06-18 MED ORDER — POTASSIUM CHLORIDE 20 MEQ PO PACK
40.0000 meq | PACK | ORAL | Status: AC
  Administered 2018-06-18: 40 meq
  Filled 2018-06-18: qty 2

## 2018-06-18 MED ORDER — POTASSIUM PHOSPHATES 15 MMOLE/5ML IV SOLN
10.0000 mmol | Freq: Once | INTRAVENOUS | Status: AC
  Administered 2018-06-18: 10 mmol via INTRAVENOUS
  Filled 2018-06-18: qty 3.33

## 2018-06-18 MED ORDER — OSMOLITE 1.5 CAL PO LIQD
1000.0000 mL | ORAL | Status: DC
  Administered 2018-06-18 – 2018-06-20 (×3): 1000 mL

## 2018-06-18 MED ORDER — POTASSIUM CHLORIDE 20 MEQ PO PACK
40.0000 meq | PACK | Freq: Once | ORAL | Status: AC
  Administered 2018-06-18: 40 meq
  Filled 2018-06-18: qty 2

## 2018-06-18 NOTE — Consult Note (Signed)
PHARMACY  CONSULT NOTE   Pharmacy Consult for Electrolyte Monitoring and Replacement Indication: High risk for refeeding   Patient Measurements: Height: 5\' 11"  (180.3 cm) Weight: 69 lb (31.3 kg) IBW/kg (Calculated) : 75.3  Vital Signs: Temp: 97.4 F (36.3 C) (10/05 0806) Temp Source: Oral (10/05 0806) BP: 143/74 (10/05 0806) Pulse Rate: 80 (10/05 0806)  Labs: Recent Labs    06/18/18 0426  WBC 8.2  HGB 8.8*  HCT 26.7*  PLT 256  CREATININE 0.63  MG 1.8  PHOS 1.7*   Potassium (mmol/L)  Date Value  06/18/2018 2.8 (L)   Magnesium (mg/dL)  Date Value  16/06/9603 1.8   Phosphorus (mg/dL)  Date Value  54/05/8118 1.7 (L)   Calcium (mg/dL)  Date Value  14/78/2956 7.7 (L)   Albumin (g/dL)  Date Value  21/30/8657 2.7 (L)  ] Estimated Creatinine Clearance: 36.4 mL/min (by C-G formula based on SCr of 0.63 mg/dL).    Assessment: Pharmacy consulted for electrolyte monitoring and replacement in 72 yo male admitted with severe malnutrition. Patient is high risk for refeeding syndrome.   Goal of Therapy:  Electrolytes WNL  Plan:  10/5  Phos: 1.7, K: 2.8, Corrected calcium ~9.5. Will order KPhos  IV x 1 dose and KCL per tube q4h x 2 doses.  Will recheck K+ @ 1800 Recheck all electrolytes with AM labs.   Pharmacy will continue to monitor labs and replace electrolyte as needed.   Gardner Candle, PharmD, BCPS Clinical Pharmacist 06/18/2018 10:45 AM

## 2018-06-18 NOTE — Progress Notes (Signed)
Patient ID: Alexander Lara, male   DOB: 1946/04/21, 72 y.o.   MRN: [id number]   [employer] PROGRESS NOTE  Alexander Lara WUJ:[id number] DOB: November 04, 1945 DOA: 2018-07-04 PCP: [facility]  HPI/Subjective: Patient answers only few questions  Objective: Vitals:   06/18/18 0421 06/18/18 0806  BP: (!) 150/85 (!) 143/74  Pulse: 93 80  Resp:    Temp: (!) 97.4 F (36.3 C) (!) 97.4 F (36.3 C)  SpO2: 90% 91%    Filed Weights   06/09/18 0206 06/10/18 0338 06/15/18 0345  Weight: 28.9 kg 29.9 kg 31.3 kg    ROS: Review of Systems  Unable to perform ROS: Acuity of condition  Respiratory: Positive for cough. Negative for shortness of breath.   Cardiovascular: Negative for chest pain.  Gastrointestinal: Negative for abdominal pain and vomiting.   Exam: Physical Exam  Constitutional: He appears cachectic.  HENT:  Nose: No mucosal edema.  Mouth/Throat: No oropharyngeal exudate or posterior oropharyngeal edema.  Eyes: Pupils are equal, round, and reactive to light. Conjunctivae and lids are normal.  Neck: No JVD present. Carotid bruit is not present. No edema present. No thyroid mass and no thyromegaly present.  Cardiovascular: S1 normal and S2 normal. An irregularly irregular rhythm present. Exam reveals no gallop.  No murmur heard. Pulses:      Dorsalis pedis pulses are 2+ on the right side, and 2+ on the left side.  Respiratory: No respiratory distress. He has no wheezes. He has no rhonchi. He has no rales.  GI: Soft. Bowel sounds are normal. There is no tenderness.  Musculoskeletal:       Right ankle: He exhibits no swelling.       Left ankle: He exhibits no swelling.  Lymphadenopathy:    He has no cervical adenopathy.  Neurological: He is alert.  Skin: Skin is warm. No rash noted. Nails show no clubbing.  Psychiatric:  Alert but not answering questions      Data Reviewed: Basic Metabolic Panel: Recent Labs  Lab 06/12/18 0456 06/13/18 1057 06/15/18 0557  06/18/18 0426  NA 148* 146* 148* 146*  K 3.5 3.8 3.2* 2.8*  CL 119* 117* 118* 118*  CO2 21* 22 25 22   GLUCOSE 102* 109* 100* 117*  BUN 29* 27* 23 13  CREATININE 0.68 0.85 0.65 0.63  CALCIUM 8.2* 8.4* 7.9* 7.7*  MG  --   --   --  1.8  PHOS  --   --   --  1.7*   CBC: Recent Labs  Lab 06/12/18 0456 06/13/18 1057 06/15/18 0557 06/18/18 0426  WBC 6.8  --  6.5 8.2  HGB 7.9* 9.4* 8.2* 8.8*  HCT 23.1*  --  24.3* 26.7*  MCV 78.2*  --  79.2* 78.1*  PLT 254  --  239 256   Cardiac Enzymes:   Recent Results (from the past 240 hour(s))  Urine Culture     Status: Abnormal   Collection Time: July 04, 2018  7:51 PM  Result Value Ref Range Status   Specimen Description   Final    URINE, RANDOM Performed at [facility], [address], [place], [place] [address]    Special Requests   Final    NONE Performed at [facility], [address] [place], [place] [address]    Culture >=100,000 COLONIES/mL PROTEUS MIRABILIS (A)  Final   Report Status 06/11/2018 FINAL  Final   Organism ID, Bacteria PROTEUS MIRABILIS (A)  Final      Susceptibility  Proteus mirabilis - MIC*    AMPICILLIN <=2 SENSITIVE Sensitive     CEFAZOLIN <=4 SENSITIVE Sensitive     CEFTRIAXONE <=1 SENSITIVE Sensitive     CIPROFLOXACIN <=0.25 SENSITIVE Sensitive     GENTAMICIN <=1 SENSITIVE Sensitive     IMIPENEM 4 SENSITIVE Sensitive     NITROFURANTOIN 128 RESISTANT Resistant     TRIMETH/SULFA <=20 SENSITIVE Sensitive     AMPICILLIN/SULBACTAM <=2 SENSITIVE Sensitive     PIP/TAZO <=4 SENSITIVE Sensitive     * >=100,000 COLONIES/mL PROTEUS MIRABILIS  Blood Culture ID Panel (Reflexed)     Status: Abnormal   Collection Time: 05/24/2018 10:57 PM  Result Value Ref Range Status   Enterococcus species NOT DETECTED NOT DETECTED Final   Listeria monocytogenes NOT DETECTED NOT DETECTED Final   Staphylococcus species NOT DETECTED NOT DETECTED Final   Staphylococcus aureus (BCID) NOT DETECTED NOT DETECTED Final    Streptococcus species NOT DETECTED NOT DETECTED Final   Streptococcus agalactiae NOT DETECTED NOT DETECTED Final   Streptococcus pneumoniae NOT DETECTED NOT DETECTED Final   Streptococcus pyogenes NOT DETECTED NOT DETECTED Final   Acinetobacter baumannii NOT DETECTED NOT DETECTED Final   Enterobacteriaceae species DETECTED (A) NOT DETECTED Final    Comment: Enterobacteriaceae represent a large family of gram-negative bacteria, not a single organism. CRITICAL RESULT CALLED TO, READ BACK BY AND VERIFIED WITH: JASON ROBBINS AT 1936 06/10/18.PMH    Enterobacter cloacae complex NOT DETECTED NOT DETECTED Final   Escherichia coli NOT DETECTED NOT DETECTED Final   Klebsiella oxytoca NOT DETECTED NOT DETECTED Final   Klebsiella pneumoniae NOT DETECTED NOT DETECTED Final   Proteus species DETECTED (A) NOT DETECTED Final    Comment: CRITICAL RESULT CALLED TO, READ BACK BY AND VERIFIED WITH: JASON ROBBINS AT 1936 06/10/18.PMH    Serratia marcescens NOT DETECTED NOT DETECTED Final   Carbapenem resistance NOT DETECTED NOT DETECTED Final   Haemophilus influenzae NOT DETECTED NOT DETECTED Final   Neisseria meningitidis NOT DETECTED NOT DETECTED Final   Pseudomonas aeruginosa NOT DETECTED NOT DETECTED Final   Candida albicans NOT DETECTED NOT DETECTED Final   Candida glabrata NOT DETECTED NOT DETECTED Final   Candida krusei NOT DETECTED NOT DETECTED Final   Candida parapsilosis NOT DETECTED NOT DETECTED Final   Candida tropicalis NOT DETECTED NOT DETECTED Final    Comment: Performed at [facility] [address] [place], [place] [address]  Blood culture (routine x 2)     Status: Abnormal   Collection Time: 06/03/2018 11:40 PM  Result Value Ref Range Status   Specimen Description   Final    BLOOD LEFT ANTECUBITAL Performed at [facility], [address] [place], [place] [address]    Special Requests   Final    BOTTLES DRAWN AEROBIC AND ANAEROBIC Blood Culture results may  not be optimal due to an excessive volume of blood received in culture bottles Performed at [facility], [address], [place], [place] [address]    Culture  Setup Time   Final    GRAM POSITIVE RODS ANAEROBIC BOTTLE ONLY CRITICAL VALUE NOTED.  VALUE IS CONSISTENT WITH PREVIOUSLY REPORTED AND CALLED VALUE. GRAM NEGATIVE RODS AEROBIC BOTTLE ONLY CRITICAL RESULT CALLED TO, READ BACK BY AND VERIFIED WITH: JASON ROBBINS AT 1936 06/10/18.PMH Performed at [facility], [address] [place], [place] [address]    Culture (A)  Final    PROTEUS MIRABILIS SUSCEPTIBILITIES PERFORMED ON PREVIOUS CULTURE WITHIN THE LAST 5 DAYS. CLOSTRIDIUM SPECIES Standardized  susceptibility testing for this organism is not available. Performed at [facility], [address], [place], [place] [address]    Report Status 06/13/2018 FINAL  Final  Blood culture (routine x 2)     Status: Abnormal   Collection Time: 06-17-2018 11:40 PM  Result Value Ref Range Status   Specimen Description   Final    BLOOD RIGHT ANTECUBITAL Performed at [facility], [address] [place], [place] [address]    Special Requests   Final    BOTTLES DRAWN AEROBIC AND ANAEROBIC Blood Culture results may not be optimal due to an excessive volume of blood received in culture bottles Performed at [facility], [address] [place], [place] [address]    Culture  Setup Time   Final    GRAM POSITIVE RODS IN BOTH AEROBIC AND ANAEROBIC BOTTLES CRITICAL RESULT CALLED TO, READ BACK BY AND VERIFIED WITH: HANK ZOMPA 06/09/18 1142 REC    Culture (A)  Final    PROTEUS MIRABILIS CLOSTRIDIUM SPECIES Standardized susceptibility testing for this organism is not available. Performed at [facility], [address] [place], [place] [address]    Report Status 06/13/2018 FINAL  Final   Organism ID, Bacteria PROTEUS MIRABILIS  Final      Susceptibility   Proteus mirabilis - MIC*    AMPICILLIN  <=2 SENSITIVE Sensitive     CEFAZOLIN <=4 SENSITIVE Sensitive     CEFEPIME <=1 SENSITIVE Sensitive     CEFTAZIDIME <=1 SENSITIVE Sensitive     CEFTRIAXONE <=1 SENSITIVE Sensitive     CIPROFLOXACIN <=0.25 SENSITIVE Sensitive     GENTAMICIN <=1 SENSITIVE Sensitive     IMIPENEM 2 SENSITIVE Sensitive     TRIMETH/SULFA <=20 SENSITIVE Sensitive     AMPICILLIN/SULBACTAM <=2 SENSITIVE Sensitive     PIP/TAZO <=4 SENSITIVE Sensitive     * PROTEUS MIRABILIS  Blood Culture ID Panel (Reflexed)     Status: None   Collection Time: 2018-06-17 11:40 PM  Result Value Ref Range Status   Enterococcus species NOT DETECTED NOT DETECTED Final   Listeria monocytogenes NOT DETECTED NOT DETECTED Final   Staphylococcus species NOT DETECTED NOT DETECTED Final   Staphylococcus aureus (BCID) NOT DETECTED NOT DETECTED Final   Streptococcus species NOT DETECTED NOT DETECTED Final   Streptococcus agalactiae NOT DETECTED NOT DETECTED Final   Streptococcus pneumoniae NOT DETECTED NOT DETECTED Final   Streptococcus pyogenes NOT DETECTED NOT DETECTED Final   Acinetobacter baumannii NOT DETECTED NOT DETECTED Final   Enterobacteriaceae species NOT DETECTED NOT DETECTED Final   Enterobacter cloacae complex NOT DETECTED NOT DETECTED Final   Escherichia coli NOT DETECTED NOT DETECTED Final   Klebsiella oxytoca NOT DETECTED NOT DETECTED Final   Klebsiella pneumoniae NOT DETECTED NOT DETECTED Final   Proteus species NOT DETECTED NOT DETECTED Final   Serratia marcescens NOT DETECTED NOT DETECTED Final   Haemophilus influenzae NOT DETECTED NOT DETECTED Final   Neisseria meningitidis NOT DETECTED NOT DETECTED Final   Pseudomonas aeruginosa NOT DETECTED NOT DETECTED Final   Candida albicans NOT DETECTED NOT DETECTED Final   Candida glabrata NOT DETECTED NOT DETECTED Final   Candida krusei NOT DETECTED NOT DETECTED Final   Candida parapsilosis NOT DETECTED NOT DETECTED Final   Candida tropicalis NOT DETECTED NOT DETECTED Final     Comment: Performed at [facility], [address], [place], [place] [address]     Studies: Dg Abd 1 View  Result Date: 06/17/2018 CLINICAL DATA:  NG tube placement. EXAM: ABDOMEN - 1 VIEW COMPARISON:  06/17/2018. FINDINGS: Dobbhoff tube noted with its tip now noted over the distal stomach. No bowel distention. Multifocal bilateral subsegmental atelectasis/pulmonary mild infiltrates. IMPRESSION: 1. Dobbhoff tube noted with its tip now noted over the distal stomach. 2. Multifocal mild bilateral subsegmental atelectasis/pulmonary infiltrates. Electronically Signed   By: Maisie Fus  Register   On: 06/17/2018 15:25   Dg Abd 1 View  Result Date: 06/17/2018 CLINICAL DATA:  Feeding tube placement EXAM: ABDOMEN - 1 VIEW COMPARISON:  CT abdomen 06/13/2018 FINDINGS: The feeding tube extends into the stomach and is facing cephalad in the stomach fundus region. Tubing projecting over the right chest likely represents a PICC line. Emphysema noted.  Compression fractures observed at T12 and L2. IMPRESSION: 1. Feeding tube tip is in the stomach fundus, oriented cephalad. 2.  Emphysema (ICD10-J43.9). 3. Compression fractures at T12 and L2. Electronically Signed   By: Gaylyn Rong M.D.   On: 06/17/2018 12:39   Ct Head Wo Contrast  Result Date: 06/16/2018 CLINICAL DATA:  Altered mental status, history of seizures, failure to thrive, former smoker, history of prior subdural hematoma EXAM: CT HEAD WITHOUT CONTRAST TECHNIQUE: Contiguous axial images were obtained from the base of the skull through the vertex without intravenous contrast. Sagittal and coronal MPR images reconstructed from axial data set. COMPARISON:  None FINDINGS: Brain: Generalized cerebral and cerebellar atrophy. Normal ventricular morphology. No midline shift or mass effect. Small vessel chronic ischemic changes of deep cerebral white matter. 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 No intracranial hemorrhage, mass lesion, evidence of  acute infarction, or extra-axial fluid collection. Vascular: Mild atherosclerotic calcification of internal carotid arteries bilaterally at skull base. No hyperdense vessels. Skull: Demineralized but intact Sinuses/Orbits: Clear paranasal sinuses. Minimal fluid within a few RIGHT mastoid air cells. Other: N/A IMPRESSION: Atrophy with small vessel chronic ischemic changes of deep cerebral white matter. 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 No acute intracranial abnormalities. Electronically Signed   By: Ulyses Southward M.D.   On: 06/16/2018 15:32    Scheduled Meds: . free water  30 mL Per Tube Q4H  . mouth rinse  15 mL Mouth Rinse BID  . multivitamin  15 mL Per Tube Daily  . potassium chloride  40 mEq Per Tube Q4H  . sodium chloride flush  10-40 mL Intracatheter Q12H  . vitamin C  250 mg Per Tube BID   Continuous Infusions: . sodium chloride 10 mL/hr at 06/18/18 0820  . [START ON 06/20/2018] sodium chloride    . ampicillin-sulbactam (UNASYN) IV 3 g (06/18/18 0830)  . dextrose 5 % and 0.9 % NaCl with KCl 20 mEq/L Stopped (06/18/18 0830)  . feeding supplement (OSMOLITE 1.2 CAL) 1,000 mL (06/18/18 1002)  . feeding supplement (OSMOLITE 1.5 CAL)    . levETIRAcetam 500 mg (06/18/18 1008)  . potassium PHOSPHATE IVPB (in mmol) 10 mmol (06/18/18 1230)    Assessment/Plan:   1. Proteus sepsis from indwelling Foley catheter.  Blood cultures and urine cultures positive.  Foley catheter changed.  This will have to be changed every 4 weeks.  Continue Unasyn  2. Clostridium species also and blood culture.  IV Unasyn which would cover Clostridium species also.  Family decided against colonoscopy at this point. 3. Failed swallow evaluation.  Unable to reach sister on the phone.  Did reach fianc who also spoke with the patient's son about the PEG tube.  They have decided to go ahead with the PEG tube.  Spoke with  interventional radiology and they will not be able to do at this point until next week.    4. 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  CT scan not showing any signs of malignancy at this point.  Unasyn will cover aspiration 5. Atrial fibrillation with rapid ventricular response.  Metoprolol changed to IV as needed 6. History of seizures on Keppra switch to IV 7. Drop in hemoglobin.  Hemoglobin 8.2 today.  Continue to monitor. 8. Failure to thrive, cachexia and malnutrition.  Overall prognosis is poor.  Patient is a DNR.   9. BPH.  Hold oral meds  Code Status:     Code Status Orders  (From admission, onward)         Start     Ordered   06/10/18 1120  Do not attempt resuscitation (DNR)  Continuous    Question Answer Comment  In the event of cardiac or respiratory ARREST Do not call a "code blue"   In the event of cardiac or respiratory ARREST Do not perform Intubation, CPR, defibrillation or ACLS   In the event of cardiac or respiratory ARREST Use medication by any route, position, wound care, and other measures to relive pain and suffering. May use oxygen, suction and manual treatment of airway obstruction as needed for comfort.      06/10/18 1121        Code Status History    Date Active Date Inactive Code Status Order ID Comments User Context   06/09/2018 0209 06/10/2018 1121 DNR [id number]  Barbaraann Rondo, MD ED   05/15/2018 1120 05/18/2018 2303 Full Code [id number]  Auburn Bilberry, MD Inpatient   05/15/2018 0125 05/15/2018 1120 Full Code [id number]  Cammy Copa, MD Inpatient    Advance Directive Documentation     Most Recent Value  Type of Advance Directive  Healthcare Power of Attorney  Pre-existing out of facility DNR order (yellow form or pink MOST form)  -  "MOST" Form in Place?  -     Disposition Plan: Will not be able to get PEG tube until next week  Antibiotics:  Unasyn  Time spent: 25 minutes.  Spoke with fianc on the phone today.  Sister did not pick up the phone when I called her.  Alexander Lara [employer]

## 2018-06-18 NOTE — Progress Notes (Signed)
Nutrition Follow Up Note   DOCUMENTATION CODES:   Severe malnutrition in context of chronic illness  INTERVENTION:   Change to Osmolite 1.5 @ goal rate of 54m/hr. Continue at 261mhr and increase by 1070mr every 12 hrs until goal rate is reached.   Free water flushes 60m21m4 hrs  Regimen provides 1440kcal/day, 60g/day protein, 912ml12m free water  Liquid MVI daily  Vitamin C 250mg 34mvia tube  NUTRITION DIAGNOSIS:   Severe Malnutrition related to chronic illness(emphysema ) as evidenced by severe fat depletion, severe muscle depletion.  GOAL:   Patient will meet greater than or equal to 90% of their needs  -not met  MONITOR:   PO intake, Weight trends, Labs, Supplement acceptance  ASSESSMENT:   [facility] is resident of [facility] for urinary retention w/ foley catheter in place and failure to thrive. Previous admit 8/31PMH: emphysema, seizures, subdural heamtoma   Spoke with RN, pt tolerating tube feeding well. Plan to continue to advance to goal rate. Pt refeeding today; requested electrolyte consult.   Medications reviewed and include: KCl, Kphos, MVI, NaCl w/ KCl and 5% dextrose _0 /hr, Unasyn  Labs reviewed: Na 146(L), K 2.8(L), P 1.7(L), Mg 1.8 wnl  Diet Order:   Diet Order            Diet NPO time specified  Diet effective midnight        Diet NPO time specified  Diet effective now             EDUCATION NEEDS:   No education needs have been identified at this time  Skin:  Skin Assessment: Reviewed RN Assessment(bilateral arm ecchymosis)  Last BM:  10/2 - large type 5  Height:   Ht Readings from Last 1 Encounters:  05/17/2018 _1  (1.803 m)    Weight:   Wt Readings from Last 1 Encounters:  06/15/18 31.3 kg    Ideal Body Weight:  78.2 kg  BMI:  Body mass index is 9.62 kg/m.  Estimated Nutritional Needs:   Kcal:  1500-1800kcal/day   Protein:  54-60g/day   Fluid:  1.0L/day   Alexander Lara Koleen DistanceD, LDN Pager #-  [phone]#- [phone] Hours Pager: [phone]

## 2018-06-18 NOTE — Progress Notes (Signed)
Nutrition pump rate increased to 30.  Next bottle was ordered from dining services.  Patient's fiancee wanted to speak with the nurse supervisor because she felt someone on a previous shift had not been wiping the ports with alcohol wipes.  RN called the [facility] who met with the fiancee.  [name] Knack, RN

## 2018-06-18 NOTE — Progress Notes (Signed)
Nutritionist called and said to finish the current bottle before hanging the new bottle of NG tube solution/food.  Rate change increase will be due around 6pm.  Christean Grief, RN

## 2018-06-18 NOTE — Consult Note (Signed)
PHARMACY  CONSULT NOTE   Pharmacy Consult for Electrolyte Monitoring and Replacement Indication: High risk for refeeding   Patient Measurements: Height: 5\' 11"  (180.3 cm) Weight: 69 lb (31.3 kg) IBW/kg (Calculated) : 75.3  Vital Signs: Temp: 98.6 F (37 C) (10/05 1641) Temp Source: Oral (10/05 1641) BP: 138/92 (10/05 1641) Pulse Rate: 87 (10/05 1641)  Labs: Recent Labs    06/18/18 0426  WBC 8.2  HGB 8.8*  HCT 26.7*  PLT 256  CREATININE 0.63  MG 1.8  PHOS 1.7*   Potassium (mmol/L)  Date Value  06/18/2018 3.2 (L)   Magnesium (mg/dL)  Date Value  06/18/2018 1.8   Phosphorus (mg/dL)  Date V<MEASUREMEKentuckyT>[id number] 8th16109S32imRiverview [id number] [place]<MEASUREMEKentuckyT>d.[place]914HTruddie169 [place] S[address]S75imHolmes County HoSwee26t798 Atl<MEAS(https://[facility] CenChristianne Dolinrza.com/s://N<MEASUREMEKentuckyT>thKent(62https://[facility] HospiChristianne Dolinrza.com/demy1<ME6Septemberhttps4Septemberht30https20https://[facility] CenChristianne Dolinrza.com/Milit<MEAS227https://[facility] MChristianne Dolinrza.com/y914H<MEASUREMEKentuckyT>ruKentucky914H6Truddie24 Border16109S48imManatB30r[address] Ltder 1610Owe616 883 408BeverlyOnnie BoerENTLaCleotis NippWilhemina Bo9.52itoColonnade Endoscopy CentCaryl Ada Spraguee(64Pr REMENTKentuckyLajo [phone]. s and replace electrolyte as needed.   Amandajo Gonder, PharmD, BCPS 06/18/2018 7:22 PM

## 2018-06-19 ENCOUNTER — Encounter: Payer: Self-pay | Admitting: *Deleted

## 2018-06-19 LAB — COMPREHENSIVE METABOLIC PANEL
ALT: 15 U/L (ref 0–44)
ANION GAP: 5 (ref 5–15)
AST: 18 U/L (ref 15–41)
Albumin: 2.1 g/dL — ABNORMAL LOW (ref 3.5–5.0)
Alkaline Phosphatase: 45 U/L (ref 38–126)
BUN: 15 mg/dL (ref 8–23)
CHLORIDE: 111 mmol/L (ref 98–111)
CO2: 21 mmol/L — ABNORMAL LOW (ref 22–32)
Calcium: 7.2 mg/dL — ABNORMAL LOW (ref 8.9–10.3)
Creatinine, Ser: 0.58 mg/dL — ABNORMAL LOW (ref 0.61–1.24)
GFR calc Af Amer: >60 mL/min (ref >60)
Glucose, Bld: 162 mg/dL — ABNORMAL HIGH (ref 70–99)
Potassium: 3.6 mmol/L (ref 3.5–5.1)
Sodium: 137 mmol/L (ref 135–145)
Total Bilirubin: 0.4 mg/dL (ref 0.3–1.2)
Total Protein: 5.2 g/dL — ABNORMAL LOW (ref 6.5–8.1)

## 2018-06-19 LAB — POTASSIUM: Potassium: 3.7 mmol/L (ref 3.5–5.1)

## 2018-06-19 LAB — MAGNESIUM: MAGNESIUM: 1.7 mg/dL (ref 1.7–2.4)

## 2018-06-19 LAB — PHOSPHORUS: PHOSPHORUS: 1.8 mg/dL — AB (ref 2.5–4.6)

## 2018-06-19 MED ORDER — POTASSIUM PHOSPHATES 15 MMOLE/5ML IV SOLN
20.0000 mmol | Freq: Once | INTRAVENOUS | Status: AC
  Administered 2018-06-19: 20 mmol via INTRAVENOUS
  Filled 2018-06-19: qty 6.67

## 2018-06-19 MED ORDER — LEVETIRACETAM 100 MG/ML PO SOLN
500.0000 mg | Freq: Two times a day (BID) | ORAL | Status: DC
  Administered 2018-06-19 – 2018-06-20 (×4): 500 mg
  Filled 2018-06-19 (×8): qty 5

## 2018-06-19 NOTE — Consult Note (Signed)
PHARMACY  CONSULT NOTE   Pharmacy Consult for Electrolyte Monitoring and Replacement Indication: High risk for refeeding   Patient Measurements: Height: 5\' 11"  (180.3 cm) Weight: 69 lb (31.3 kg) IBW/kg (Calculated) : 75.3  Vital Signs: Temp: 98.4 F (36.9 C) (10/06 0742) Temp Source: Oral (10/06 0742) BP: 148/92 (10/06 0742) Pulse Rate: 91 (10/06 0742)  Labs: Recent Labs    06/18/18 0426 06/19/18 0514  WBC 8.2  --   HGB 8.8*  --   HCT 26.7*  --   PLT 256  --   CREATININE 0.63  --   MG 1.8 1.7  PHOS 1.7* 1.8*   Potassium (mmol/L)  Date Value  06/19/2018 3.7   Magnesium (mg/dL)  Date Value  16/06/9603 1.7   Phosphorus (mg/dL)  Date Value  54/05/8118 1.8 (L)   Calcium (mg/dL)  Date Value  14/78/2956 7.7 (L)   Albumin (g/dL)  Date Value  21/30/8657 2.7 (L)  ] Estimated Creatinine Clearance: 36.4 mL/min (by C-G formula based on SCr of 0.63 mg/dL).    Assessment: Pharmacy consulted for electrolyte monitoring and replacement in 72 yo male admitted with severe malnutrition. Patient is high risk for refeeding syndrome.   Goal of Therapy:  Electrolytes WNL  Plan:  10/5  Phos: 1.7, K: 2.8, Corrected calcium ~9.5. Will order KPhos  IV x 1 dose and KCL per tube q4h x 2 doses.  Will recheck K+ @ 1800  10/5 1800 K 3.2, appears that patient only received one dose of the KCl q4h x 2 ordered. Will order an additional dose of KCl per tube once.  10/6: K: 3.7, Phos: 1.8, Magnesium: 1.7. Will KPhos 20 mmol IV x 1. Will recheck @ 1800   Pharmacy will continue to monitor labs and replace electrolyte as needed.   Demetrius Charity, PharmD  06/19/2018 8:39 AM

## 2018-06-19 NOTE — Progress Notes (Signed)
Patient ID: Alexander Lara, male   DOB: Oct 01, 1945, 72 y.o.   MRN: [id number]   [facility] PROGRESS NOTE  Alexander Lara WUJ:[id number] DOB: [birth year] DOA: 06/15/2018 PCP: [facility]  HPI/Subjective: Patient denies any new complaints does not communicate much  Objective: Vitals:   06/19/18 0507 06/19/18 0742  BP: (!) 162/93 (!) 148/92  Pulse: 89 91  Resp: 17 18  Temp: 98.5 F (36.9 C) 98.4 F (36.9 C)  SpO2: 97% 100%    Filed Weights   06/09/18 0206 06/10/18 0338 06/15/18 0345  Weight: 28.9 kg 29.9 kg 31.3 kg    ROS: Review of Systems  Unable to perform ROS: Acuity of condition  Respiratory: Positive for cough. Negative for shortness of breath.   Cardiovascular: Negative for chest pain.  Gastrointestinal: Negative for abdominal pain and vomiting.   Exam: Physical Exam  Constitutional: He appears cachectic.  HENT:  Nose: No mucosal edema.  Mouth/Throat: No oropharyngeal exudate or posterior oropharyngeal edema.  Eyes: Pupils are equal, round, and reactive to light. Conjunctivae and lids are normal.  Neck: No JVD present. Carotid bruit is not present. No edema present. No thyroid mass and no thyromegaly present.  Cardiovascular: S1 normal and S2 normal. An irregularly irregular rhythm present. Exam reveals no gallop.  No murmur heard. Pulses:      Dorsalis pedis pulses are 2+ on the right side, and 2+ on the left side.  Respiratory: No respiratory distress. He has no wheezes. He has no rhonchi. He has no rales.  GI: Soft. Bowel sounds are normal. There is no tenderness.  Musculoskeletal:       Right ankle: He exhibits no swelling.       Left ankle: He exhibits no swelling.  Lymphadenopathy:    He has no cervical adenopathy.  Neurological: He is alert.  Skin: Skin is warm. No rash noted. Nails show no clubbing.  Psychiatric:  Alert but not answering questions      Data Reviewed: Basic Metabolic Panel: Recent Labs  Lab 06/13/18 1057  06/15/18 0557 06/18/18 0426 06/18/18 1821 06/19/18 0514  NA 146* 148* 146*  --   --   K 3.8 3.2* 2.8* 3.2* 3.7  CL 117* 118* 118*  --   --   CO2 22 25 22   --   --   GLUCOSE 109* 100* 117*  --   --   BUN 27* 23 13  --   --   CREATININE 0.85 0.65 0.63  --   --   CALCIUM 8.4* 7.9* 7.7*  --   --   MG  --   --  1.8  --  1.7  PHOS  --   --  1.7*  --  1.8*   CBC: Recent Labs  Lab 06/13/18 1057 06/15/18 0557 06/18/18 0426  WBC  --  6.5 8.2  HGB 9.4* 8.2* 8.8*  HCT  --  24.3* 26.7*  MCV  --  79.2* 78.1*  PLT  --  239 256   Cardiac Enzymes:   No results found for this or any previous visit (from the past 240 hour(s)).   Studies: Dg Abd 1 View  Result Date: 06/17/2018 CLINICAL DATA:  NG tube placement. EXAM: ABDOMEN - 1 VIEW COMPARISON:  06/17/2018. FINDINGS: Dobbhoff tube noted with its tip now noted over the distal stomach. No bowel distention. Multifocal bilateral subsegmental atelectasis/pulmonary mild infiltrates. IMPRESSION: 1. Dobbhoff tube noted with its tip now noted over the distal stomach. 2. Multifocal mild bilateral  subsegmental atelectasis/pulmonary infiltrates. Electronically Signed   By: Maisie Fus  Register   On: 06/17/2018 15:25    Scheduled Meds: . free water  30 mL Per Tube Q4H  . levETIRAcetam  500 mg Per Tube BID  . mouth rinse  15 mL Mouth Rinse BID  . multivitamin  15 mL Per Tube Daily  . sodium chloride flush  10-40 mL Intracatheter Q12H  . vitamin C  250 mg Per Tube BID   Continuous Infusions: . sodium chloride 10 mL/hr at 06/18/18 0820  . [START ON 06/20/2018] sodium chloride    . ampicillin-sulbactam (UNASYN) IV 3 g (06/19/18 0810)  . dextrose 5 % and 0.9 % NaCl with KCl 20 mEq/L Stopped (06/18/18 0830)  . feeding supplement (OSMOLITE 1.5 CAL) 40 mL/hr at 06/19/18 0717  . potassium PHOSPHATE IVPB (in mmol) 20 mmol (06/19/18 1019)    Assessment/Plan:   1. Proteus sepsis from indwelling Foley catheter.  Blood cultures and urine cultures positive.   Foley catheter changed.  This will have to be changed every 4 weeks.  Continue Unasyn  2. Clostridium species also and blood culture.  IV Unasyn which would cover Clostridium species also.  Family decided against colonoscopy at this point. 3. Failed swallow evaluation.  Plan for interventional radiology to do percutaneously inserted to 4. 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  CT scan not showing any signs of malignancy at this point.  Unasyn for chronic aspiration 5. Atrial fibrillation with rapid ventricular response.  Metoprolol changed to IV as needed 6. History of seizures on Keppra IV can be switched to oral if able to put down to the NG tube discussed with the nurse 7. Drop in hemoglobin.  Hemoglobin 8.2 today.  Continue to monitor. 8. Failure to thrive, cachexia and malnutrition.  Overall prognosis is poor.  Patient is a DNR.   9. BPH.  meds on hold  Code Status:     Code Status Orders  (From admission, onward)         Start     Ordered   06/10/18 1120  Do not attempt resuscitation (DNR)  Continuous    Question Answer Comment  In the event of cardiac or respiratory ARREST Do not call a "code blue"   In the event of cardiac or respiratory ARREST Do not perform Intubation, CPR, defibrillation or ACLS   In the event of cardiac or respiratory ARREST Use medication by any route, position, wound care, and other measures to relive pain and suffering. May use oxygen, suction and manual treatment of airway obstruction as needed for comfort.      06/10/18 1121        Code Status History    Date Active Date Inactive Code Status Order ID Comments User Context   06/09/2018 0209 06/10/2018 1121 DNR [id number]  Barbaraann Rondo, MD ED   05/15/2018 1120 05/18/2018 2303 Full Code [id number]  Auburn Bilberry, MD Inpatient   05/15/2018 0125 05/15/2018 1120 Full Code [id number]  Cammy Copa, MD Inpatient    Advance Directive Documentation     Most Recent  Value  Type of Advance Directive  Healthcare Power of Attorney  Pre-existing out of facility DNR order (yellow form or pink MOST form)  -  "MOST" Form in Place?  -     Disposition Plan: Will not be able to get PEG tube until next week  Antibiotics:  Unasyn  Time spent: 25 minutes.  Spoke with fianc on  the phone today.  Sister did not pick up the phone when I called her.  Layne Lebon [employer]

## 2018-06-19 NOTE — Consult Note (Signed)
PHARMACY  CONSULT NOTE   Pharmacy Consult for Electrolyte Monitoring and Replacement Indication: High risk for refeeding   Patient Measurements: Height: 5\' 11"  (180.3 cm) Weight: 69 lb (31.3 kg) IBW/kg (Calculated) : 75.3  Vital Signs: Temp: 98.7 F (37.1 C) (10/06 1543) Temp Source: Oral (10/06 1543) BP: 155/82 (10/06 1543) Pulse Rate: 87 (10/06 1543)  Labs: Recent Labs    06/18/18 0426 06/19/18 0514 06/19/18 1859  WBC 8.2  --   --   HGB 8.8*  --   --   HCT 26.7*  --   --   PLT 256  --   --   CREATININE 0.63  --  0.58*  MG 1.8 1.7  --   PHOS 1.7* 1.8*  --   ALBUMIN  --   --  2.1*  PROT  --   --  5.2*  AST  --   --  18  ALT  --   --  15  ALKPHOS  --   --  45  BILITOT  --   --  0.4   Potassium (mmol/L)  Date Value  06/19/2018 3.6   Magnesium (mg/dL)  Date Value  29/52/8413 1.7   Phosphorus (mg/dL)  Date Value  24/40/1027 1.8 (L)   Calcium (mg/dL)  Date Value  25/36/6440 7.2 (L)   Albumin (g/dL)  Date Value  34/74/2595 2.1 (L)  ] Estimated Creatinine Clearance: 36.4 mL/min (A) (by C-G formula based on SCr of 0.58 mg/dL (L)).    Assessment: Pharmacy consulted for electrolyte monitoring and replacement in 72 yo male admitted with severe malnutrition. Patient is high risk for refeeding syndrome.   Goal of Therapy:  Electrolytes WNL  Plan:  10/5  Phos: 1.7, K: 2.8, Corrected calcium ~9.5. Will order KPhos  IV x 1 dose and KCL per tube q4h x 2 doses.  Will recheck K+ @ 1800  10/5 1800 K 3.2, appears that patient only received one dose of the KCl q4h x 2 ordered. Will order an additional dose of KCl per tube once.  10/6: K: 3.7, Phos: 1.8, Magnesium: 1.7. Will KPhos 20 mmol IV x 1. Will recheck @ 1800  10/06 @ 1859 K 3.6 Will recheck electrolytes with morning labs   Pharmacy will continue to monitor labs and replace electrolyte as needed.   Abbie Sons, PharmD  06/19/2018 8:07 PM

## 2018-06-20 ENCOUNTER — Other Ambulatory Visit: Payer: Self-pay

## 2018-06-20 ENCOUNTER — Other Ambulatory Visit: Payer: Self-pay | Admitting: Emergency Medicine

## 2018-06-20 LAB — CBC
HEMATOCRIT: 25.7 % — AB (ref 40.0–52.0)
Hemoglobin: 8.4 g/dL — ABNORMAL LOW (ref 13.0–18.0)
MCH: 26 pg (ref 26.0–34.0)
MCHC: 32.8 g/dL (ref 32.0–36.0)
MCV: 79.2 fL — AB (ref 80.0–100.0)
Platelets: 225 10*3/uL (ref 150–440)
RBC: 3.24 MIL/uL — AB (ref 4.40–5.90)
RDW: 26.6 % — ABNORMAL HIGH (ref 11.5–14.5)
WBC: 11.9 10*3/uL — AB (ref 3.8–10.6)

## 2018-06-20 LAB — PROTIME-INR
INR: 1.16
Prothrombin Time: 14.7 seconds (ref 11.4–15.2)

## 2018-06-20 LAB — MAGNESIUM: Magnesium: 1.8 mg/dL (ref 1.7–2.4)

## 2018-06-20 LAB — PHOSPHORUS: Phosphorus: 2.5 mg/dL (ref 2.5–4.6)

## 2018-06-20 LAB — POTASSIUM: Potassium: 3.6 mmol/L (ref 3.5–5.1)

## 2018-06-20 MED ORDER — OSMOLITE 1.5 CAL PO LIQD
1000.0000 mL | ORAL | Status: DC
  Administered 2018-06-20: 1000 mL

## 2018-06-20 MED ORDER — MAGNESIUM SULFATE IN D5W 1-5 GM/100ML-% IV SOLN
1.0000 g | Freq: Once | INTRAVENOUS | Status: AC
  Administered 2018-06-20: 1 g via INTRAVENOUS
  Filled 2018-06-20: qty 100

## 2018-06-20 MED ORDER — AMOXICILLIN-POT CLAVULANATE 250-62.5 MG/5ML PO SUSR: 500.0000 mg | Freq: Two times a day (BID) | ORAL | Status: DC

## 2018-06-20 MED ORDER — SODIUM CHLORIDE 0.9 % IV SOLN: INTRAVENOUS | Status: DC

## 2018-06-20 MED ORDER — POTASSIUM PHOSPHATES 15 MMOLE/5ML IV SOLN
10.0000 mmol | Freq: Once | INTRAVENOUS | Status: AC
  Administered 2018-06-20: 10 mmol via INTRAVENOUS
  Filled 2018-06-20: qty 3.33

## 2018-06-20 MED ORDER — AMOXICILLIN-POT CLAVULANATE 400-57 MG/5ML PO SUSR
500.0000 mg | Freq: Two times a day (BID) | ORAL | Status: DC
  Administered 2018-06-20 (×2): 500 mg
  Filled 2018-06-20 (×4): qty 6.3

## 2018-06-20 NOTE — Clinical Social Work Note (Signed)
CSW received phone call from patient's significant other Levon Hedger, [phone].  CSW spoke to her to discuss SNF placement options, and applying for Medicaid.  Patient's significant other stated that she has submitted paperwork for long term care Medicaid.  [name] said that patient's son Beckam Abdulaziz. [phone] has been helping make decisions even though he lives in [place].  [name] and patient's son have decided to have feeding tube inserted patient.  CSW informed [name], that insurance may only approve a few days since he is not able to work with PT or OT currently, CSW informed her that she will have to private pay until [place] is approved after insurance stops paying.  CSW also informed [name] that if patient is able to go to [facility] for short term, they can not guarantee a long term care bed, and they can help her find a long term care bed somewhere else, [name] expressed understanding.  CSW informed her that patient may not improve, but she is hopeful that he will, but understands that he may not.  CSW presented bed offers and [facility] was her choice.  CSW called [facility] and informed them of choice, they can accept patient once he is medically ready for discharge and orders have been received.  CSW informed [facility] that patient will most likely need to transition to long term care.  CSW contacted [facility] DSS and spoke to patient's [facility] Worker Marylou Flesher, and she said patient's Medicaid Pending number is [id number] S.  CSW provided the Medicaid Pending number to [facility], CSW to continue to follow patient's progress throughout discharge planning.  Ervin Knack. Alfredo Spong, MSW, Theresia Majors [phone]  06/20/2018 4:11 PM

## 2018-06-20 NOTE — Care Management Important Message (Signed)
Copy of signed IM left with patient in room.  

## 2018-06-20 NOTE — Consult Note (Signed)
WOC Nurse wound consult note Reason for Consult:Skin is intact.  Underweight.  Recommend low air loss mattress.  Called and spoke with receptionist who agrees to call for sizewise alternate mattress with low air loss feature.  Wound type:none Pressure Injury POA: NA Will not follow at this time.  Please re-consult if needed.  Maple Hudson MSN, RN, FNP-BC CWON Wound, Ostomy, Continence Nurse Pager [phone]

## 2018-06-20 NOTE — Care Management (Signed)
Patient to have PEG placed on 10-8 then possible discharge to SNF.

## 2018-06-20 NOTE — Progress Notes (Signed)
Consent for PEG tube placement obtained via phone from son Sinclair Ship, Montez Hageman.  Consent on chart.

## 2018-06-20 NOTE — Consult Note (Signed)
PHARMACY  CONSULT NOTE   Pharmacy Consult for Electrolyte Monitoring and Replacement Indication: High risk for refeeding   Patient Measurements: Height: 5\' 11"  (180.3 cm) Weight: 69 lb (31.3 kg) IBW/kg (Calculated) : 75.3  Vital Signs: Temp: 98.5 F (36.9 C) (10/07 0908) Temp Source: Axillary (10/07 0908) BP: 134/88 (10/07 0908) Pulse Rate: 88 (10/07 0908)  Labs: Recent Labs    06/18/18 0426 06/19/18 0514 06/19/18 1859 06/20/18 0453  WBC 8.2  --   --  11.9*  HGB 8.8*  --   --  8.4*  HCT 26.7*  --   --  25.7*  PLT 256  --   --  225  CREATININE 0.63  --  0.58*  --   MG 1.8 1.7  --  1.8  PHOS 1.7* 1.8*  --  2.5  ALBUMIN  --   --  2.1*  --   PROT  --   --  5.2*  --   AST  --   --  18  --   ALT  --   --  15  --   ALKPHOS  --   --  45  --   BILITOT  --   --  0.4  --    Potassium (mmol/L)  Date Value  06/20/2018 3.6   Magnesium (mg/dL)  Date Value  16/06/9603 1.8   Phosphorus (mg/dL)  Date Value  54/05/8118 2.5   Calcium (mg/dL)  Date Value  14/78/2956 7.2 (L)   Albumin (g/dL)  Date Value  21/30/8657 2.1 (L)  ] Estimated Creatinine Clearance: 36.4 mL/min (A) (by C-G formula based on SCr of 0.58 mg/dL (L)).    Assessment: Pharmacy consulted for electrolyte monitoring and replacement in 72 yo male admitted with severe malnutrition. Patient is high risk for refeeding syndrome.   Goal of Therapy:  Electrolytes WNL  ELECTROLYTE COURSE 10/5  Phos: 1.7, K: 2.8, Corrected calcium ~9.5. Will order KPhos  IV x 1 dose and KCL per tube q4h x 2 doses.  Will recheck K+ @ 1800  10/5 1800 K 3.2, appears that patient only received one dose of the KCl q4h x 2 ordered. Will order an additional dose of KCl per tube once.  10/6: K: 3.7, Phos: 1.8, Magnesium: 1.7. Will KPhos 20 mmol IV x 1. Will recheck @ 1800  10/06 @ 1859 K 3.6 Will recheck electrolytes with morning labs  Plan: 10/7 AM K 3.6, Mg 1.8, phos 2.5. Calcium/albumin not assessed.  Patient remains on D5NS with 20 mEq/L which will supply approximately 19.2 mEq K per day. She also received KPhos 20 mmol IV x 1 yesterday for low phos. She continues to be NPO. Patient weighs approximately 31.3 kg according to most recent documentation. Today we will continue supplementation with magnesium sulfate 1 gm IV x 1, potassium phosphate 10 mmol IV x 1, and IVF as ordered. Will recheck electrolytes tomorrow with AM labs.    Pharmacy will continue to monitor labs and replace electrolyte as needed.   Denetra Formoso A. [place], [place].D., BCPS Clinical Pharmacist 06/20/2018 10:41 AM

## 2018-06-20 NOTE — Progress Notes (Signed)
Patient ID: Alexander Lara, male   DOB: Sep 23, 1945, 72 y.o.   MRN: [id number]   [facility] PROGRESS NOTE  Garron Eline WUJ:[id number] DOB: 01-20-1946 DOA: 06/02/2018 PCP: [facility]  HPI/Subjective: Pt denies any complaints  Objective: Vitals:   06/20/18 0908 06/20/18 1107  BP: 134/88   Pulse: 88   Resp:    Temp: 98.5 F (36.9 C)   SpO2: 100% 96%    Filed Weights   06/09/18 0206 06/10/18 0338 06/15/18 0345  Weight: 28.9 kg 29.9 kg 31.3 kg    ROS: Review of Systems  Unable to perform ROS: Acuity of condition  Respiratory: Positive for cough. Negative for shortness of breath.   Cardiovascular: Negative for chest pain.  Gastrointestinal: Negative for abdominal pain and vomiting.   Exam: Physical Exam  Constitutional: He appears cachectic.  HENT:  Nose: No mucosal edema.  Mouth/Throat: No oropharyngeal exudate or posterior oropharyngeal edema.  Eyes: Pupils are equal, round, and reactive to light. Conjunctivae and lids are normal.  Neck: No JVD present. Carotid bruit is not present. No edema present. No thyroid mass and no thyromegaly present.  Cardiovascular: S1 normal and S2 normal. An irregularly irregular rhythm present. Exam reveals no gallop.  No murmur heard. Pulses:      Dorsalis pedis pulses are 2+ on the right side, and 2+ on the left side.  Respiratory: No respiratory distress. He has no wheezes. He has no rhonchi. He has no rales.  GI: Soft. Bowel sounds are normal. There is no tenderness.  Musculoskeletal:       Right ankle: He exhibits no swelling.       Left ankle: He exhibits no swelling.  Lymphadenopathy:    He has no cervical adenopathy.  Neurological: He is alert.  Skin: Skin is warm. No rash noted. Nails show no clubbing.  Psychiatric:  Alert but not answering questions      Data Reviewed: Basic Metabolic Panel: Recent Labs  Lab 06/15/18 0557 06/18/18 0426 06/18/18 1821 06/19/18 0514 06/19/18 1859 06/20/18 0453  NA  148* 146*  --   --  137  --   K 3.2* 2.8* 3.2* 3.7 3.6 3.6  CL 118* 118*  --   --  111  --   CO2 25 22  --   --  21*  --   GLUCOSE 100* 117*  --   --  162*  --   BUN 23 13  --   --  15  --   CREATININE 0.65 0.63  --   --  0.58*  --   CALCIUM 7.9* 7.7*  --   --  7.2*  --   MG  --  1.8  --  1.7  --  1.8  PHOS  --  1.7*  --  1.8*  --  2.5   CBC: Recent Labs  Lab 06/15/18 0557 06/18/18 0426 06/20/18 0453  WBC 6.5 8.2 11.9*  HGB 8.2* 8.8* 8.4*  HCT 24.3* 26.7* 25.7*  MCV 79.2* 78.1* 79.2*  PLT 239 256 225   Cardiac Enzymes:   No results found for this or any previous visit (from the past 240 hour(s)).   Studies: No results found.  Scheduled Meds: . free water  30 mL Per Tube Q4H  . levETIRAcetam  500 mg Per Tube BID  . mouth rinse  15 mL Mouth Rinse BID  . multivitamin  15 mL Per Tube Daily  . sodium chloride flush  10-40 mL Intracatheter Q12H  .  vitamin C  250 mg Per Tube BID   Continuous Infusions: . sodium chloride Stopped (06/20/18 0407)  . sodium chloride 10 mL/hr at 06/20/18 0853  . sodium chloride    . ampicillin-sulbactam (UNASYN) IV Stopped (06/20/18 1000)  . dextrose 5 % and 0.9 % NaCl with KCl 20 mEq/L 40 mL/hr at 06/20/18 1027  . feeding supplement (OSMOLITE 1.5 CAL) 1,000 mL (06/20/18 1147)  . potassium PHOSPHATE IVPB (in mmol)      Assessment/Plan:   1. Proteus sepsis from indwelling Foley catheter.  Blood cultures and urine cultures positive.  Foley catheter changed.  This will have to be changed every 4 weeks.  Continue Unasyn  2. Clostridium species also and blood culture.  IV Unasyn which would cover Clostridium species also.  Family decided against colonoscopy at this point. 3. Failed swallow evaluation.  Plan for interventional radiology to do percutaneously inserted to 4. 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  CT scan not showing any signs of malignancy at this point.  Unasyn for chronic  aspiration 5. Atrial fibrillation with rapid ventricular response.  Metoprolol changed to IV as needed 6. History of seizures on Keppra IV can be switched to oral if able to put down to the NG tube discussed with the nurse 7. Drop in hemoglobin.  Hemoglobin 8.2 today.  Continue to monitor. 8. Failure to thrive, cachexia and malnutrition.  Overall prognosis is poor.  Patient is a DNR.   9. BPH.  meds on hold  Code Status:     Code Status Orders  (From admission, onward)         Start     Ordered   06/10/18 1120  Do not attempt resuscitation (DNR)  Continuous    Question Answer Comment  In the event of cardiac or respiratory ARREST Do not call a "code blue"   In the event of cardiac or respiratory ARREST Do not perform Intubation, CPR, defibrillation or ACLS   In the event of cardiac or respiratory ARREST Use medication by any route, position, wound care, and other measures to relive pain and suffering. May use oxygen, suction and manual treatment of airway obstruction as needed for comfort.      06/10/18 1121        Code Status History    Date Active Date Inactive Code Status Order ID Comments User Context   06/09/2018 0209 06/10/2018 1121 DNR [id number]  Barbaraann Rondo, MD ED   05/15/2018 1120 05/18/2018 2303 Full Code [id number]  Auburn Bilberry, MD Inpatient   05/15/2018 0125 05/15/2018 1120 Full Code [id number]  Cammy Copa, MD Inpatient    Advance Directive Documentation     Most Recent Value  Type of Advance Directive  Healthcare Power of Attorney  Pre-existing out of facility DNR order (yellow form or pink MOST form)  -  "MOST" Form in Place?  -     Disposition Plan: Will not be able to get PEG tube until next week  Antibiotics:  Unasyn  Time spent: 25 minutes.  Spoke with fianc on the phone today.  Sister did not pick up the phone when I called her.  Navya Timmons [employer]

## 2018-06-20 NOTE — Consult Note (Signed)
Chief Complaint: Patient was seen in consultation today for percutaneous gastrostomy tube placement   Referring Physician(s): Auburn Bilberry  Patient Status: [facility] - In-pt  History of Present Illness: Alexander Lara is a 72 y.o. male with a severe protein calorie malnutrition and failure to thrive.  Patient presented with urinary tract infection and has a chronic indwelling urinary catheter.  It appears that many of these medical problems are associated with a fall and subdural hematoma approximately 8 months ago.  Patient's medical condition has reportedly deteriorated since that fall and subdural hematoma.  Patient has been treated for Proteus sepsis with positive blood and urine cultures.  Patient also had a blood culture showing Clostridium species.  GI was consulted for the Clostridium infection and there were discussions about a colonoscopy.  It was felt that the patient would not tolerate colonoscopy prep and colonoscopy was not performed.  Patient currently has a Dobbhoff feeding tube.  Past Medical History:  Diagnosis Date  . Anxiety   . Emphysema of lung (HCC)   . Foley catheter in place   . Seizures (HCC)   . Subdural hematoma (HCC)   . Urinary retention     History reviewed. No pertinent surgical history.  Allergies: Patient has no known allergies.  Medications: Prior to Admission medications   Medication Sig Start Date End Date Taking? Authorizing Provider  acetaminophen (TYLENOL) 325 MG tablet Take 650 mg by mouth every 6 (six) hours as needed for mild pain.   Yes [provider]  docusate (COLACE) 50 MG/5ML liquid Take 100 mg by mouth daily.   Yes [provider]  ENSURE PLUS (ENSURE PLUS) LIQD Take 237 mLs by mouth 2 (two) times daily between meals.   Yes [provider]  ferrous sulfate 220 (44 Fe) MG/5ML solution Take 330 mg by mouth 2 (two) times daily with a meal.   Yes [provider]  levETIRAcetam (KEPPRA) 100 MG/ML  solution Take 500 mg by mouth 2 (two) times daily.   Yes [provider]  magnesium oxide (MAG-OX) 400 (241.3 Mg) MG tablet Take 1 tablet (400 mg total) by mouth daily. 05/19/18  Yes Enedina Finner, MD  megestrol (MEGACE) 400 MG/10ML suspension Take 20 mLs (800 mg total) by mouth daily. 05/19/18  Yes Enedina Finner, MD  Multiple Vitamin (MULTIVITAMIN) LIQD Take 15 mLs by mouth daily.   Yes [provider]  phosphorus (K PHOS NEUTRAL) 155-852-130 MG tablet Take 2 tablets (500 mg total) by mouth daily. 05/19/18  Yes Enedina Finner, MD  tamsulosin (FLOMAX) 0.4 MG CAPS capsule Take 1 capsule (0.4 mg total) by mouth daily. 05/19/18  Yes Enedina Finner, MD  vitamin C (VITAMIN C) 250 MG tablet Take 1 tablet (250 mg total) by mouth 2 (two) times daily. 05/18/18  Yes Enedina Finner, MD     History reviewed. No pertinent family history.  Social History   Socioeconomic History  . Marital status: Divorced    Spouse name: Not on file  . Number of children: Not on file  . Years of education: Not on file  . Highest education level: Not on file  Occupational History  . Not on file  Social Needs  . Financial resource strain: Not on file  . Food insecurity:    Worry: Not on file    Inability: Not on file  . Transportation needs:    Medical: Not on file    Non-medical: Not on file  Tobacco Use  . Smoking status: Former Games developer  .  Smokeless tobacco: Never Used  Substance and Sexual Activity  . Alcohol use: No    Frequency: Never  . Drug use: No  . Sexual activity: Not on file  Lifestyle  . Physical activity:    Days per week: Not on file    Minutes per session: Not on file  . Stress: Not on file  Relationships  . Social connections:    Talks on phone: Not on file    Gets together: Not on file    Attends religious service: Not on file    Active member of club or organization: Not on file    Attends meetings of clubs or organizations: Not on file    Relationship status: Not on file  Other Topics  Concern  . Not on file  Social History Narrative  . Not on file      Review of Systems  Neurological: Positive for speech difficulty.    Vital Signs: BP 134/88 (BP Location: Right Arm)   Pulse 88   Temp 98.5 F (36.9 C) (Axillary)   Resp 12   Ht 5\' 11"  (1.803 m)   Wt 31.3 kg   SpO2 100%   BMI 9.62 kg/m   Physical Exam  Constitutional:  Patient is very cachectic.   HENT:  Patient cannot open mouth very well even when using a tongue depressor.   Cannot accurately evaluate oropharynx.  Cardiovascular: Normal rate.  Pulmonary/Chest:  Faint breath sounds  Abdominal: Soft. He exhibits no distension. There is no tenderness.    Imaging: Dg Abd 1 View  Result Date: 06/17/2018 CLINICAL DATA:  NG tube placement. EXAM: ABDOMEN - 1 VIEW COMPARISON:  06/17/2018. FINDINGS: Dobbhoff tube noted with its tip now noted over the distal stomach. No bowel distention. Multifocal bilateral subsegmental atelectasis/pulmonary mild infiltrates. IMPRESSION: 1. Dobbhoff tube noted with its tip now noted over the distal stomach. 2. Multifocal mild bilateral subsegmental atelectasis/pulmonary infiltrates. Electronically Signed   By: Maisie Fus  Register   On: 06/17/2018 15:25   Dg Abd 1 View  Result Date: 06/17/2018 CLINICAL DATA:  Feeding tube placement EXAM: ABDOMEN - 1 VIEW COMPARISON:  CT abdomen 06/13/2018 FINDINGS: The feeding tube extends into the stomach and is facing cephalad in the stomach fundus region. Tubing projecting over the right chest likely represents a PICC line. Emphysema noted.  Compression fractures observed at T12 and L2. IMPRESSION: 1. Feeding tube tip is in the stomach fundus, oriented cephalad. 2.  Emphysema (ICD10-J43.9). 3. Compression fractures at T12 and L2. Electronically Signed   By: Gaylyn Rong M.D.   On: 06/17/2018 12:39   Ct Head Wo Contrast  Result Date: 06/16/2018 CLINICAL DATA:  Altered mental status, history of seizures, failure to thrive, former smoker,  history of prior subdural hematoma EXAM: CT HEAD WITHOUT CONTRAST TECHNIQUE: Contiguous axial images were obtained from the base of the skull through the vertex without intravenous contrast. Sagittal and coronal MPR images reconstructed from axial data set. COMPARISON:  None FINDINGS: Brain: Generalized cerebral and cerebellar atrophy. Normal ventricular morphology. No midline shift or mass effect. Small vessel chronic ischemic changes of deep cerebral white matter. 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 No intracranial hemorrhage, mass lesion, evidence of acute infarction, or extra-axial fluid collection. Vascular: Mild atherosclerotic calcification of internal carotid arteries bilaterally at skull base. No hyperdense vessels. Skull: Demineralized but intact Sinuses/Orbits: Clear paranasal sinuses. Minimal fluid within a few RIGHT mastoid air cells. Other: N/A IMPRESSION: Atrophy with small vessel chronic ischemic changes of deep cerebral white  matter. 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 No acute intracranial abnormalities. Electronically Signed   By: Ulyses Southward M.D.   On: 06/16/2018 15:32   Ct Chest W Contrast  Result Date: 06/13/2018 CLINICAL DATA:  Unintentional weight loss. EXAM: CT CHEST, ABDOMEN, AND PELVIS WITH CONTRAST TECHNIQUE: Multidetector CT imaging of the chest, abdomen and pelvis was performed following the standard protocol during bolus administration of intravenous contrast. CONTRAST:  50mL OMNIPAQUE IOHEXOL 300 MG/ML  SOLN COMPARISON:  None. FINDINGS: CT CHEST FINDINGS Cardiovascular: Small pericardial effusion with a maximum thickness of 10 mm. Atheromatous calcifications, including the coronary arteries and aorta. Mediastinum/Nodes: No enlarged mediastinal, hilar, or axillary lymph nodes. Thyroid gland, trachea, and esophagus demonstrate no significant findings. Lungs/Pleura: The lungs are hyperexpanded with mild bullous changes. Irregular patchy densities in the right lower lobe and  inferior right upper lobe. Minimal bilateral dependent atelectasis. No pleural fluid. Musculoskeletal: Approximately 30% T11 vertebral body superior endplate compression deformity with sclerosis and minimal bony retropulsion. No acute fracture lines. CT ABDOMEN PELVIS FINDINGS Hepatobiliary: No focal liver abnormality is seen. No gallstones, gallbladder wall thickening, or biliary dilatation. Pancreas: Unremarkable. No pancreatic ductal dilatation or surrounding inflammatory changes. Spleen: Normal in size without focal abnormality. Adrenals/Urinary Tract: Foley catheter in the urinary bladder with associated air in the bladder. Multiple dependent calculi in the urinary bladder. These are difficult to separate from each other with the largest individual calculus that can be separated measuring 5 mm in diameter. Normal appearing adrenal glands, kidneys and ureters. Stomach/Bowel: Limited due to the lack of intravenous and oral contrast and lack of body fat. No gross abnormality of the stomach, small bowel or colon seen. No evidence of appendicitis. Vascular/Lymphatic: Atheromatous arterial calcifications without aneurysm. No enlarged lymph nodes. Reproductive: Normal sized prostate gland. Other: No abdominal wall hernia or abnormality. No abdominopelvic ascites. Musculoskeletal: Changes of avascular necrosis involving both femoral heads without bony collapse. Approximately 40% old L2 superior endplate compression deformity with minimal bony retropulsion, sclerosis and no acute fracture lines. Mild lumbar spine degenerative changes. IMPRESSION: 1. Patchy densities in the right lower lobe and inferior right upper lobe, compatible with incompletely resolved pneumonia. Follow-up chest radiographs are recommended until this completely clears. 2. Mild-to-moderate changes of COPD. 3. Small pericardial effusion. 4. Multiple dependent calculi in the urinary bladder. 5. 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 Aortic Atherosclerosis (ICD10-I70.0) and Emphysema (ICD10-J43.9). Electronically Signed   By: Beckie Salts M.D.   On: 06/13/2018 15:45   Ct Abdomen Pelvis W Contrast  Result Date: 06/13/2018 CLINICAL DATA:  Unintentional weight loss. EXAM: CT CHEST, ABDOMEN, AND PELVIS WITH CONTRAST TECHNIQUE: Multidetector CT imaging of the chest, abdomen and pelvis was performed following the standard protocol during bolus administration of intravenous contrast. CONTRAST:  50mL OMNIPAQUE IOHEXOL 300 MG/ML  SOLN COMPARISON:  None. FINDINGS: CT CHEST FINDINGS Cardiovascular: Small pericardial effusion with a maximum thickness of 10 mm. Atheromatous calcifications, including the coronary arteries and aorta. Mediastinum/Nodes: No enlarged mediastinal, hilar, or axillary lymph nodes. Thyroid gland, trachea, and esophagus demonstrate no significant findings. Lungs/Pleura: The lungs are hyperexpanded with mild bullous changes. Irregular patchy densities in the right lower lobe and inferior right upper lobe. Minimal bilateral dependent atelectasis. No pleural fluid. Musculoskeletal: Approximately 30% T11 vertebral body superior endplate compression deformity with sclerosis and minimal bony retropulsion. No acute fracture lines. CT ABDOMEN PELVIS FINDINGS Hepatobiliary: No focal liver abnormality is seen. No gallstones, gallbladder wall thickening, or biliary dilatation. Pancreas: Unremarkable. No pancreatic ductal dilatation  or surrounding inflammatory changes. Spleen: Normal in size without focal abnormality. Adrenals/Urinary Tract: Foley catheter in the urinary bladder with associated air in the bladder. Multiple dependent calculi in the urinary bladder. These are difficult to separate from each other with the largest individual calculus that can be separated measuring 5 mm in diameter. Normal appearing adrenal glands, kidneys and ureters. Stomach/Bowel: Limited due to the lack of intravenous and oral contrast and lack of  body fat. No gross abnormality of the stomach, small bowel or colon seen. No evidence of appendicitis. Vascular/Lymphatic: Atheromatous arterial calcifications without aneurysm. No enlarged lymph nodes. Reproductive: Normal sized prostate gland. Other: No abdominal wall hernia or abnormality. No abdominopelvic ascites. Musculoskeletal: Changes of avascular necrosis involving both femoral heads without bony collapse. Approximately 40% old L2 superior endplate compression deformity with minimal bony retropulsion, sclerosis and no acute fracture lines. Mild lumbar spine degenerative changes. IMPRESSION: 1. Patchy densities in the right lower lobe and inferior right upper lobe, compatible with incompletely resolved pneumonia. Follow-up chest radiographs are recommended until this completely clears. 2. Mild-to-moderate changes of COPD. 3. Small pericardial effusion. 4. Multiple dependent calculi in the urinary bladder. 5. 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 Aortic Atherosclerosis (ICD10-I70.0) and Emphysema (ICD10-J43.9). Electronically Signed   By: Beckie Salts M.D.   On: 06/13/2018 15:45   Dg Chest Portable 1 View  Result Date: 2018-06-16 CLINICAL DATA:  Cough today with cloudy urine and recent diagnosis of right lower lobe pneumonia 3 weeks ago. History of emphysema and former smoker. EXAM: PORTABLE CHEST 1 VIEW COMPARISON:  None. FINDINGS: Emphysematous hyperinflation of lungs. The patient is slightly rotated on this study accentuating the paramediastinal soft tissues on the right. Partial clearing of previously noted right lower lobe pneumonia. Minimal right upper lobe pneumonia is also noted, similar in appearance to prior abutting the minor fissure. No new pulmonary consolidations. No effusion or pneumothorax. Mild aortic atherosclerosis. No acute osseous abnormality. IMPRESSION: Partial clearing of right sided pneumonia in the background of emphysema. Electronically Signed   By:  Tollie Eth M.D.   On: 06-16-2018 23:32   [place] Ekg Site Rite  Result Date: 06/13/2018 If Site Rite image not attached, placement could not be confirmed due to current cardiac rhythm.   Labs:  CBC: Recent Labs    06/12/18 0456 06/13/18 1057 06/15/18 0557 06/18/18 0426 06/20/18 0453  WBC 6.8  --  6.5 8.2 11.9*  HGB 7.9* 9.4* 8.2* 8.8* 8.4*  HCT 23.1*  --  24.3* 26.7* 25.7*  PLT 254  --  239 256 225    COAGS: Recent Labs    05/14/18 2056 06/20/18 0453  INR 1.24 1.16  APTT 38*  --     BMP: Recent Labs    06/13/18 1057 06/15/18 0557 06/18/18 0426 06/18/18 1821 06/19/18 0514 06/19/18 1859 06/20/18 0453  NA 146* 148* 146*  --   --  137  --   K 3.8 3.2* 2.8* 3.2* 3.7 3.6 3.6  CL 117* 118* 118*  --   --  111  --   CO2 22 25 22   --   --  21*  --   GLUCOSE 109* 100* 117*  --   --  162*  --   BUN 27* 23 13  --   --  15  --   CALCIUM 8.4* 7.9* 7.7*  --   --  7.2*  --   CREATININE 0.85 0.65 0.63  --   --  0.58*  --  GFRNONAA >60 >60 >60  --   --  >60  --   GFRAA >60 >60 >60  --   --  >60  --     LIVER FUNCTION TESTS: Recent Labs    05/14/18 2056 Jun 22, 2018 2045 06/19/18 1859  BILITOT 0.5 0.6 0.4  AST 22 14* 18  ALT 15 11 15   ALKPHOS 64 51 45  PROT 6.2* 6.5 5.2*  ALBUMIN 2.6* 2.7* 2.1*    TUMOR MARKERS: No results for input(s): AFPTM, CEA, CA199, CHROMGRNA in the last 8760 hours.  Assessment and Plan:  72 year old with failure to thrive and severe malnutrition.  Patient was evaluated by speech therapy and patient has severe oropharyngeal dysphasia with high risk for aspiration.  Patient needs a gastrostomy tube for nutritional support.  I evaluated the patient for percutaneous gastrostomy tube.  I reviewed previous CT imaging and the patient would be a candidate for gastrostomy tube based on his anatomy.  However, due to patient's mental status I am unable to accurately evaluate his airway and cannot perform moderate sedation for this procedure.  I discussed the  sedation issue with anesthesia and they felt the procedure would need to be performed either in the operating room or with endoscopy.  Patient's son is making his medical decisions.  I gave the son the option of performing the percutaneous gastrostomy tube with local anesthetic and pain medications only versus going to the operating room or endoscopy with anesthesia.  The son was adamant that he would prefer using anesthesia and sedation for the procedure.  Patient has been seen by GI in the past and they have said that they would evaluate him for a gastrostomy tube.  At this point, we will have GI evaluate the patient for gastrostomy tube placement.   Thank you for this interesting consult.  I greatly enjoyed meeting Arad Burston and look forward to participating in their care.  A copy of this report was sent to the requesting provider on this date.  Electronically Signed: Arn Medal, MD 06/20/2018, 10:03 AM   I spent a total of 20 Minutes    in face to face in clinical consultation, greater than 50% of which was counseling/coordinating care for failure to thrive and gastrostomy tube placement.

## 2018-06-20 NOTE — NC FL2 (Signed)
[place] MEDICAID FL2 LEVEL OF CARE SCREENING TOOL     IDENTIFICATION  Patient Name: Alexander Lara Birthdate: [birth year] Sex: male Admission Date (Current Location): 06/15/2018  [place] and [place] Number:  [address] Medicaid Pending [id number] [facility] Facility and Address:  [facility], [address] [place], [place] [address]      Provider Number: [id number]  Attending Physician Name and Address:  Auburn Bilberry, MD  Relative Name and Phone Number:  Marland, Reine   [phone] Rogers,[name] Sister   [phone] or [facility] Significant other   [phone]     Current Level of Care: Hospital Recommended Level of Care: Skilled Nursing Facility Prior Approval Number:    Date Approved/Denied:   PASRR Number: [id number]  Discharge Plan: SNF    Current Diagnoses: Patient Active Problem List   Diagnosis Date Noted  . Recurrent UTI (urinary tract infection) 06/09/2018  . Adult failure to thrive   . Palliative care by specialist   . Severe protein-calorie malnutrition (HCC) 05/17/2018  . Sepsis (HCC) 05/14/2018    Orientation RESPIRATION BLADDER Height & Weight     Self, Place  Normal Indwelling catheter Weight: 69 lb (31.3 kg) Height:  5\' 11"  (180.3 cm)  BEHAVIORAL SYMPTOMS/MOOD NEUROLOGICAL BOWEL NUTRITION STATUS      Incontinent Diet, Feeding tube  AMBULATORY STATUS COMMUNICATION OF NEEDS Skin   Extensive Assist Verbally Normal                       Personal Care Assistance Level of Assistance  Bathing, Feeding, Dressing Bathing Assistance: Maximum assistance Feeding assistance: Maximum assistance(Peg Tube) Dressing Assistance: Maximum assistance     Functional Limitations Info  Sight, Hearing, Speech Sight Info: Adequate Hearing Info: Adequate Speech Info: Adequate    SPECIAL CARE FACTORS FREQUENCY  Speech therapy     PT Frequency: 5x a week       Speech Therapy Frequency: Minimum 2x a week      Contractures  Contractures Info: Not present    Additional Factors Info  Code Status, Allergies Code Status Info: DNR Allergies Info: NKA           Current Medications (06/20/2018):  This is the current hospital active medication list Current Facility-Administered Medications  Medication Dose Route Frequency Provider Last Rate Last Dose  . 0.9 %  sodium chloride infusion   Intravenous PRN Alford Highland, MD   Stopped at 06/20/18 0407  . 0.9 %  sodium chloride infusion   Intravenous Continuous Richarda Overlie, MD 10 mL/hr at 06/20/18 1434    . 0.9 %  sodium chloride infusion   Intravenous Continuous Tahiliani, Varnita B, MD      . acetaminophen (TYLENOL) tablet 650 mg  650 mg Oral Q6H PRN Oralia Manis, MD   650 mg at 06/18/18 0012  . albuterol (PROVENTIL) (2.5 MG/3ML) 0.083% nebulizer solution 2.5 mg  2.5 mg Nebulization Q4H PRN Milagros Loll, MD   2.5 mg at 06/13/18 1922  . amoxicillin-clavulanate (AUGMENTIN) 400-57 MG/5ML suspension 500 mg  500 mg Per Tube Q12H Auburn Bilberry, MD      . dextrose 5 % and 0.9 % NaCl with KCl 20 mEq/L infusion   Intravenous Continuous Alford Highland, MD 40 mL/hr at 06/20/18 1437    . feeding supplement (OSMOLITE 1.5 CAL) liquid 1,000 mL  1,000 mL Per Tube Continuous Auburn Bilberry, MD 30 mL/hr at 06/20/18 1147 1,000 mL at 06/20/18 1147  . free water 30 mL  30 mL Per Tube  Q4H Auburn Bilberry, MD   30 mL at 06/20/18 1117  . levETIRAcetam (KEPPRA) 100 MG/ML solution 500 mg  500 mg Per Tube BID Auburn Bilberry, MD   500 mg at 06/20/18 1108  . MEDLINE mouth rinse  15 mL Mouth Rinse BID Alford Highland, MD   15 mL at 06/19/18 2239  . metoprolol tartrate (LOPRESSOR) injection 5 mg  5 mg Intravenous Q6H PRN Cammy Copa, MD   5 mg at 06/11/18 0024  . metoprolol tartrate (LOPRESSOR) injection 5 mg  5 mg Intravenous Q1H PRN Wieting, Richard, MD      . multivitamin liquid 15 mL  15 mL Per Tube Daily Auburn Bilberry, MD   15 mL at 06/20/18 1108  . ondansetron (ZOFRAN) injection  4 mg  4 mg Intravenous Q6H PRN Barbaraann Rondo, MD      . polyvinyl alcohol (LIQUIFILM TEARS) 1.4 % ophthalmic solution 1 drop  1 drop Both Eyes QID PRN Morton Stall, NP   1 drop at 06/18/18 0425  . potassium PHOSPHATE 10 mmol in dextrose 5 % 250 mL infusion  10 mmol Intravenous Once Auburn Bilberry, MD 42 mL/hr at 06/20/18 1441 10 mmol at 06/20/18 1441  . senna-docusate (Senokot-S) tablet 1 tablet  1 tablet Oral QHS PRN Barbaraann Rondo, MD   1 tablet at 06/10/18 2055  . sodium chloride flush (NS) 0.9 % injection 10-40 mL  10-40 mL Intracatheter Q12H Alford Highland, MD   10 mL at 06/20/18 0855  . sodium chloride flush (NS) 0.9 % injection 10-40 mL  10-40 mL Intracatheter PRN Alford Highland, MD   10 mL at 06/20/18 0331  . vitamin C (ASCORBIC ACID) tablet 250 mg  250 mg Per Tube BID Auburn Bilberry, MD   250 mg at 06/20/18 1108     Discharge Medications: Please see discharge summary for a list of discharge medications.  Relevant Imaging Results:  Relevant Lab Results:   Additional Information SSN [id number]  Darleene Cleaver, [place]

## 2018-06-20 NOTE — Plan of Care (Signed)
  Problem: Nutrition: Goal: Adequate nutrition will be maintained Outcome: Progressing Note:  Currently receiving continuous tube feeds   Problem: Elimination: Goal: Will not experience complications related to bowel motility Outcome: Progressing Note:  Multiple BM's this shift Goal: Will not experience complications related to urinary retention Outcome: Progressing Note:  Foley in place, draining well   Problem: Pain Managment: Goal: General experience of comfort will improve Outcome: Progressing   Problem: Safety: Goal: Ability to remain free from injury will improve Outcome: Progressing

## 2018-06-20 NOTE — Progress Notes (Signed)
SLP Cancellation Note  Patient Details Name: Alexander Lara MRN: [id number] DOB: 11/27/1945   Cancelled treatment:       Reason Eval/Treat Not Completed: Patient not medically ready(chart reviewed; consulted CM ). Per chart notes, pt to have PEG placed on 10-8 then possible discharge to SNF. Pt's MBSS revealed severe oropharyngeal phase dysphagia; unsafe for an oral diet and unable to meet his nutrition/hydration needs adequately. Pt has had a baseline of oroph[facility] phase [facility] w/ suspected chronic aspiration pneumonia impacting his R lung presentation for some time per MD/chart notes. Pt has had significant Weight loss - w/ BMI of 9.62; 31.3 kg.   Recommend f/u by skilled ST services if pt desires post PEG placement, d/c to SNF, and Weight gain sufficient for pt to have the stamina for daily oropharyngeal phase swallowing exercises; OMEs. Recommend frequent oral care for hygiene and stimulation, oral interest daily. CM updated.     Jerilynn Som, MS, CCC-SLP Starlin Steib 06/20/2018, 5:09 PM

## 2018-06-21 ENCOUNTER — Inpatient Hospital Stay: Payer: Medicare Other | Admitting: Anesthesiology

## 2018-06-21 ENCOUNTER — Encounter: Admission: EM | Disposition: E | Payer: Self-pay | Source: Home / Self Care | Attending: Internal Medicine

## 2018-06-21 ENCOUNTER — Inpatient Hospital Stay: Payer: Medicare Other

## 2018-06-21 ENCOUNTER — Encounter: Payer: Self-pay | Admitting: Anesthesiology

## 2018-06-21 DIAGNOSIS — Z431 Encounter for attention to gastrostomy: Secondary | ICD-10-CM

## 2018-06-21 DIAGNOSIS — R29818 Other symptoms and signs involving the nervous system: Secondary | ICD-10-CM

## 2018-06-21 DIAGNOSIS — L8990 Pressure ulcer of unspecified site, unspecified stage: Secondary | ICD-10-CM

## 2018-06-21 DIAGNOSIS — R1310 Dysphagia, unspecified: Secondary | ICD-10-CM

## 2018-06-21 HISTORY — PX: PEG PLACEMENT: SHX5437

## 2018-06-21 HISTORY — PX: LAPAROTOMY: SHX154

## 2018-06-21 LAB — CBC
HEMATOCRIT: 25 % — AB (ref 39.0–52.0)
Hemoglobin: 7.9 g/dL — ABNORMAL LOW (ref 13.0–17.0)
MCH: 25.6 pg — ABNORMAL LOW (ref 26.0–34.0)
MCHC: 31.6 g/dL (ref 30.0–36.0)
MCV: 80.9 fL (ref 80.0–100.0)
Platelets: 258 10*3/uL (ref 150–400)
RBC: 3.09 MIL/uL — ABNORMAL LOW (ref 4.22–5.81)
RDW: 24.4 % — ABNORMAL HIGH (ref 11.5–15.5)
WBC: 11.5 10*3/uL — AB (ref 4.0–10.5)
nRBC: 0 % (ref 0.0–0.2)

## 2018-06-21 LAB — BASIC METABOLIC PANEL
ANION GAP: 5 (ref 5–15)
Anion gap: 8 (ref 5–15)
BUN: 15 mg/dL (ref 8–23)
BUN: 14 mg/dL (ref 8–23)
CALCIUM: 7.5 mg/dL — AB (ref 8.9–10.3)
CHLORIDE: 106 mmol/L (ref 98–111)
CO2: 22 mmol/L (ref 22–32)
CO2: 19 mmol/L — ABNORMAL LOW (ref 22–32)
CREATININE: 0.51 mg/dL — AB (ref 0.61–1.24)
Calcium: 7.5 mg/dL — ABNORMAL LOW (ref 8.9–10.3)
Chloride: 110 mmol/L (ref 98–111)
Creatinine, Ser: 0.6 mg/dL — ABNORMAL LOW (ref 0.61–1.24)
GFR calc Af Amer: >60 mL/min (ref >60)
GFR calc non Af Amer: >60 mL/min (ref >60)
Glucose, Bld: 93 mg/dL (ref 70–99)
Glucose, Bld: 164 mg/dL — ABNORMAL HIGH (ref 70–99)
Potassium: 3.8 mmol/L (ref 3.5–5.1)
Potassium: 3.9 mmol/L (ref 3.5–5.1)
SODIUM: 137 mmol/L (ref 135–145)
SODIUM: 133 mmol/L — AB (ref 135–145)

## 2018-06-21 LAB — BLOOD GAS, ARTERIAL
Acid-base deficit: 4.4 mmol/L — ABNORMAL HIGH (ref 0.0–2.0)
BICARBONATE: 17.6 mmol/L — AB (ref 20.0–28.0)
FIO2: 0.8
O2 Saturation: 81 %
PATIENT TEMPERATURE: 37
PH ART: 7.51 — AB (ref 7.350–7.450)
pCO2 arterial: 22 mmHg — ABNORMAL LOW (ref 32.0–48.0)
pO2, Arterial: 40 mmHg — CL (ref 83.0–108.0)

## 2018-06-21 LAB — MAGNESIUM: MAGNESIUM: 2 mg/dL (ref 1.7–2.4)

## 2018-06-21 LAB — MRSA PCR SCREENING: MRSA by PCR: NEGATIVE

## 2018-06-21 LAB — LACTIC ACID, PLASMA: Lactic Acid, Venous: 1.9 mmol/L (ref 0.5–1.9)

## 2018-06-21 LAB — PHOSPHORUS: PHOSPHORUS: 2.5 mg/dL (ref 2.5–4.6)

## 2018-06-21 SURGERY — INSERTION, PEG TUBE
Anesthesia: General

## 2018-06-21 SURGERY — LAPAROTOMY, EXPLORATORY
Anesthesia: General | Site: Abdomen

## 2018-06-21 MED ORDER — VANCOMYCIN HCL IN DEXTROSE 1-5 GM/200ML-% IV SOLN
INTRAVENOUS | Status: AC
  Filled 2018-06-21: qty 200

## 2018-06-21 MED ORDER — SODIUM CHLORIDE 0.9 % IV SOLN
INTRAVENOUS | Status: DC
  Administered 2018-06-21: 1000 mL via INTRAVENOUS

## 2018-06-21 MED ORDER — FAMOTIDINE IN NACL 20-0.9 MG/50ML-% IV SOLN
20.0000 mg | INTRAVENOUS | Status: DC
  Administered 2018-06-21: 20 mg via INTRAVENOUS
  Filled 2018-06-21: qty 50

## 2018-06-21 MED ORDER — VANCOMYCIN HCL IN DEXTROSE 1-5 GM/200ML-% IV SOLN
1000.0000 mg | Freq: Once | INTRAVENOUS | Status: DC
  Administered 2018-06-21: 1000 mg via INTRAVENOUS
  Filled 2018-06-21: qty 200

## 2018-06-21 MED ORDER — POTASSIUM & SODIUM PHOSPHATES 280-160-250 MG PO PACK
2.0000 | PACK | Freq: Two times a day (BID) | ORAL | Status: DC
  Filled 2018-06-21 (×2): qty 2

## 2018-06-21 MED ORDER — IPRATROPIUM-ALBUTEROL 0.5-2.5 (3) MG/3ML IN SOLN
3.0000 mL | Freq: Four times a day (QID) | RESPIRATORY_TRACT | Status: DC
  Administered 2018-06-22 – 2018-06-26 (×17): 3 mL via RESPIRATORY_TRACT
  Filled 2018-06-21 (×17): qty 3

## 2018-06-21 MED ORDER — SUCCINYLCHOLINE CHLORIDE 20 MG/ML IJ SOLN
INTRAMUSCULAR | Status: DC | PRN
  Administered 2018-06-21: 30 mg via INTRAVENOUS

## 2018-06-21 MED ORDER — FENTANYL CITRATE (PF) 100 MCG/2ML IJ SOLN
50.0000 ug | INTRAMUSCULAR | Status: DC | PRN
  Administered 2018-06-21 – 2018-06-23 (×4): 50 ug via INTRAVENOUS
  Filled 2018-06-21 (×4): qty 2

## 2018-06-21 MED ORDER — MIDAZOLAM HCL 5 MG/5ML IJ SOLN
INTRAMUSCULAR | Status: DC | PRN
  Administered 2018-06-21: 2 mg via INTRAVENOUS

## 2018-06-21 MED ORDER — INSULIN ASPART 100 UNIT/ML ~~LOC~~ SOLN
0.0000 [IU] | SUBCUTANEOUS | Status: DC
  Administered 2018-06-22: 1 [IU] via SUBCUTANEOUS
  Administered 2018-06-22: 2 [IU] via SUBCUTANEOUS
  Administered 2018-06-23: 1 [IU] via SUBCUTANEOUS
  Administered 2018-06-23: 2 [IU] via SUBCUTANEOUS
  Filled 2018-06-21 (×4): qty 1

## 2018-06-21 MED ORDER — LACTATED RINGERS IV SOLN
INTRAVENOUS | Status: DC | PRN
  Administered 2018-06-21: 23:00:00 via INTRAVENOUS

## 2018-06-21 MED ORDER — PROPOFOL 500 MG/50ML IV EMUL
INTRAVENOUS | Status: DC | PRN
  Administered 2018-06-21: 100 ug/kg/min via INTRAVENOUS

## 2018-06-21 MED ORDER — IPRATROPIUM-ALBUTEROL 0.5-2.5 (3) MG/3ML IN SOLN
3.0000 mL | Freq: Once | RESPIRATORY_TRACT | Status: AC
  Administered 2018-06-21: 3 mL via RESPIRATORY_TRACT

## 2018-06-21 MED ORDER — ROCURONIUM BROMIDE 100 MG/10ML IV SOLN
INTRAVENOUS | Status: DC | PRN
  Administered 2018-06-21: 30 mg via INTRAVENOUS

## 2018-06-21 MED ORDER — FENTANYL CITRATE (PF) 100 MCG/2ML IJ SOLN
INTRAMUSCULAR | Status: AC
  Filled 2018-06-21: qty 2

## 2018-06-21 MED ORDER — VANCOMYCIN HCL 1000 MG IV SOLR
INTRAVENOUS | Status: DC | PRN
  Administered 2018-06-21: 1000 mg via INTRAVENOUS

## 2018-06-21 MED ORDER — PIPERACILLIN-TAZOBACTAM 3.375 G IVPB
3.3750 g | Freq: Three times a day (TID) | INTRAVENOUS | Status: DC
  Administered 2018-06-22 – 2018-06-24 (×7): 3.375 g via INTRAVENOUS
  Filled 2018-06-21 (×7): qty 50

## 2018-06-21 MED ORDER — METOPROLOL TARTRATE 5 MG/5ML IV SOLN
2.5000 mg | INTRAVENOUS | Status: DC | PRN
  Administered 2018-06-22 – 2018-06-25 (×5): 2.5 mg via INTRAVENOUS
  Administered 2018-06-25: 5 mg via INTRAVENOUS
  Administered 2018-06-25: 2.5 mg via INTRAVENOUS
  Administered 2018-06-26 – 2018-07-05 (×8): 5 mg via INTRAVENOUS
  Filled 2018-06-21 (×15): qty 5

## 2018-06-21 MED ORDER — SODIUM CHLORIDE 0.9 % IV SOLN
3.0000 g | Freq: Four times a day (QID) | INTRAVENOUS | Status: DC
  Administered 2018-06-21: 3 g via INTRAVENOUS
  Filled 2018-06-21 (×4): qty 3

## 2018-06-21 MED ORDER — ACETAMINOPHEN 325 MG PO TABS
650.0000 mg | ORAL_TABLET | Freq: Four times a day (QID) | ORAL | Status: DC | PRN
  Administered 2018-06-25 – 2018-06-29 (×2): 650 mg
  Filled 2018-06-21 (×2): qty 2

## 2018-06-21 MED ORDER — PROPOFOL 10 MG/ML IV BOLUS
INTRAVENOUS | Status: DC | PRN
  Administered 2018-06-21 (×2): 50 mg via INTRAVENOUS

## 2018-06-21 MED ORDER — SODIUM CHLORIDE 0.9 % IV SOLN
500.0000 mg | Freq: Two times a day (BID) | INTRAVENOUS | Status: DC
  Administered 2018-06-21 – 2018-06-26 (×10): 500 mg via INTRAVENOUS
  Filled 2018-06-21 (×3): qty 5
  Filled 2018-06-21 (×4): qty 525
  Filled 2018-06-21 (×2): qty 5
  Filled 2018-06-21 (×2): qty 525
  Filled 2018-06-21: qty 5
  Filled 2018-06-21: qty 525
  Filled 2018-06-21: qty 5

## 2018-06-21 MED ORDER — MIDAZOLAM HCL 2 MG/2ML IJ SOLN
1.0000 mg | INTRAMUSCULAR | Status: DC | PRN
  Administered 2018-06-22: 1 mg via INTRAVENOUS
  Filled 2018-06-21: qty 2

## 2018-06-21 MED ORDER — KCL-LACTATED RINGERS-D5W 20 MEQ/L IV SOLN
INTRAVENOUS | Status: DC
  Administered 2018-06-21: 75 mL/h via INTRAVENOUS
  Filled 2018-06-21 (×3): qty 1000

## 2018-06-21 MED ORDER — SODIUM CHLORIDE 0.9 % IV SOLN
INTRAVENOUS | Status: DC
  Administered 2018-06-21: 19:00:00 via INTRAVENOUS

## 2018-06-21 MED ORDER — EPHEDRINE SULFATE 50 MG/ML IJ SOLN
INTRAMUSCULAR | Status: DC | PRN
  Administered 2018-06-21: 10 mg via INTRAVENOUS

## 2018-06-21 MED ORDER — FENTANYL CITRATE (PF) 100 MCG/2ML IJ SOLN
INTRAMUSCULAR | Status: DC | PRN
  Administered 2018-06-21 (×3): 25 ug via INTRAVENOUS
  Administered 2018-06-21: 50 ug via INTRAVENOUS
  Administered 2018-06-21: 25 ug via INTRAVENOUS

## 2018-06-21 MED ORDER — MIDAZOLAM HCL 2 MG/2ML IJ SOLN
INTRAMUSCULAR | Status: AC
  Filled 2018-06-21: qty 2

## 2018-06-21 MED ORDER — LIDOCAINE HCL (CARDIAC) PF 100 MG/5ML IV SOSY: PREFILLED_SYRINGE | INTRAVENOUS | Status: DC | PRN

## 2018-06-21 MED ORDER — JEVITY 1.5 CAL/FIBER PO LIQD: 1000.0000 mL | ORAL | Status: DC

## 2018-06-21 SURGICAL SUPPLY — 35 items
CANISTER SUCT 1200ML W/VALVE (MISCELLANEOUS) ×2 IMPLANT
CHLORAPREP W/TINT 26ML (MISCELLANEOUS) ×2 IMPLANT
COVER WAND RF STERILE (DRAPES) IMPLANT
DRAPE LAPAROTOMY 100X77 ABD (DRAPES) ×2 IMPLANT
DRSG OPSITE POSTOP 4X10 (GAUZE/BANDAGES/DRESSINGS) IMPLANT
DRSG OPSITE POSTOP 4X8 (GAUZE/BANDAGES/DRESSINGS) ×2 IMPLANT
DRSG TEGADERM 4X10 (GAUZE/BANDAGES/DRESSINGS) IMPLANT
DRSG TEGADERM 4X4.75 (GAUZE/BANDAGES/DRESSINGS) ×2 IMPLANT
DRSG TELFA 3X8 NADH (GAUZE/BANDAGES/DRESSINGS) IMPLANT
ELECT CAUTERY BLADE 6.4 (BLADE) ×2 IMPLANT
ELECT REM PT RETURN 9FT ADLT (ELECTROSURGICAL) ×2
ELECTRODE REM PT RTRN 9FT ADLT (ELECTROSURGICAL) ×1 IMPLANT
GLOVE BIO SURGEON STRL SZ 6.5 (GLOVE) ×8 IMPLANT
GOWN STRL REUS W/ TWL LRG LVL3 (GOWN DISPOSABLE) ×2 IMPLANT
GOWN STRL REUS W/TWL LRG LVL3 (GOWN DISPOSABLE) ×2
KIT TURNOVER KIT A (KITS) ×2 IMPLANT
LABEL OR SOLS (LABEL) IMPLANT
NS IRRIG 1000ML POUR BTL (IV SOLUTION) ×2 IMPLANT
PACK BASIN MAJOR ARMC (MISCELLANEOUS) ×2 IMPLANT
PACK COLON CLEAN CLOSURE (MISCELLANEOUS) IMPLANT
SPONGE GAUZE 2X2 8PLY STRL LF (GAUZE/BANDAGES/DRESSINGS) ×2 IMPLANT
SPONGE LAP 18X18 RF (DISPOSABLE) IMPLANT
STAPLER SKIN PROX 35W (STAPLE) ×2 IMPLANT
SUT PDS AB 0 CT1 27 (SUTURE) ×4 IMPLANT
SUT PDS AB 1 TP1 54 (SUTURE) IMPLANT
SUT PROLENE 0 CT 1 30 (SUTURE) ×4 IMPLANT
SUT SILK 2 0 (SUTURE)
SUT SILK 2-0 18XBRD TIE 12 (SUTURE) IMPLANT
SUT SILK 3 0 (SUTURE)
SUT SILK 3-0 18XBRD TIE 12 (SUTURE) IMPLANT
SUT VIC AB 3-0 SH 27 (SUTURE)
SUT VIC AB 3-0 SH 27X BRD (SUTURE) IMPLANT
SYR 20CC LL (SYRINGE) ×2 IMPLANT
TRAY FOLEY MTR SLVR 16FR STAT (SET/KITS/TRAYS/PACK) IMPLANT
TUBE JEJUNO 16X14 (TUBING) ×2 IMPLANT

## 2018-06-21 NOTE — Consult Note (Signed)
PHARMACY  CONSULT NOTE   Pharmacy Consult for Electrolyte Monitoring and Replacement Indication: High risk for refeeding   Patient Measurements: Height: 5\' 11"  (180.3 cm) Weight: 69 lb (31.3 kg) IBW/kg (Calculated) : 75.3  Vital Signs: Temp: 98 F (36.7 C) (10/08 0449) Temp Source: Axillary (10/08 0449) BP: 136/90 (10/08 0449) Pulse Rate: 86 (10/08 0449)  Labs: Recent Labs    06/19/18 0514 06/19/18 1859 06/20/18 0453 06/17/2018 0426  WBC  --   --  11.9*  --   HGB  --   --  8.4*  --   HCT  --   --  25.7*  --   PLT  --   --  225  --   CREATININE  --  0.58*  --  0.60*  MG 1.7  --  1.8 2.0  PHOS 1.8*  --  2.5 2.5  ALBUMIN  --  2.1*  --   --   PROT  --  5.2*  --   --   AST  --  18  --   --   ALT  --  15  --   --   ALKPHOS  --  45  --   --   BILITOT  --  0.4  --   --    Potassium (mmol/L)  Date Value  07/06/2018 3.8   Magnesium (mg/dL)  Date Value  81/19/1478 2.0   Phosphorus (mg/dL)  Date Value  29/56/2130 2.5   Calcium (mg/dL)  Date Value  86/57/8469 7.5 (L)   Albumin (g/dL)  Date Value  62/95/2841 2.1 (L)  ] Estimated Creatinine Clearance: 36.4 mL/min (A) (by C-G formula based on SCr of 0.6 mg/dL (L)).    Assessment: Pharmacy consulted for electrolyte monitoring and replacement in 72 yo male admitted with severe malnutrition. Patient is high risk for refeeding syndrome.   Goal of Therapy:  Electrolytes WNL  ELECTROLYTE COURSE 10/5  Phos: 1.7, K: 2.8, Corrected calcium ~9.5. Will order KPhos  IV x 1 dose and KCL per tube q4h x 2 doses.  Will recheck K+ @ 1800  10/5 1800 K 3.2, appears that patient only received one dose of the KCl q4h x 2 ordered. Will order an additional dose of KCl per tube once.  10/6: K: 3.7, Phos: 1.8, Magnesium: 1.7. Will KPhos 20 mmol IV x 1. Will recheck @ 1800  10/06 @ 1859 K 3.6 Will recheck electrolytes with morning labs  10/7 AM K 3.6, Mg 1.8, phos 2.5. Calcium/albumin not assessed. Patient  remains on D5NS with 20 mEq/L which will supply approximately 19.2 mEq K per day. She also received KPhos 20 mmol IV x 1 yesterday for low phos. She continues to be NPO. Patient weighs approximately 31.3 kg according to most recent documentation. Today we will continue supplementation with magnesium sulfate 1 gm IV x 1, potassium phosphate 10 mmol IV x 1, and IVF as ordered. Will recheck electrolytes tomorrow with AM labs.    Plan: 10/8 AM K 3.8, Ca 7.5, albumin (from 10/6) 2.1, adjusted calcium 9.02, phos 2.5, magnesium 2. Patient remains NPO but has NG/OG. She remains on IVF with 20 mEq/L KCl at 40 mL/hr to supply approximately 19.2 mEq K per day. She received magnesium sulfate 1 gm IV x 1 and potassium phosphate 10 mmol IV x 1 yesterday. We will continue supplementation with Phos-Nak 2 packets per tube BID x 2 doses and recheck electrolytes tomorrow with AM labs.    Pharmacy  will continue to monitor labs and replace electrolyte as needed.    Pricilla Moehle A. [place], [place].D., BCPS Clinical Pharmacist 07/04/2018 7:19 AM

## 2018-06-21 NOTE — Progress Notes (Signed)
Pt is now intubated and has been transferred to the ICU.   Patient's son Ferne Reus was notified. He is understanding and has been aware of patient's deteriorating health for a while.  I have given him the ICU direct number to call overnight to get updates.  His abdominal x-ray does show pneumoperitoneum.  Some Pneumoperitoneum is expected after a PEG tube placement.  However, x-ray is reporting large pneumoperitoneum.  Would recommend surgery consult for evaluation.  Please see EGD report which details that PEG tube was not placed.  PEG tube incision was made as detailed but PEG tube was unable to be placed. See EGD report in chart. Two clips were placed at the site of the incision made by Dr. Allegra Lai.   Above recommendations discussed with ICU intensivist

## 2018-06-21 NOTE — Consult Note (Signed)
Pharmacy Antibiotic Note  Alexander Lara is a 72 y.o. male admitted on 06/12/2018 with aspiration PNA.  Pharmacy has been consulted for Ampicillin/Sulbactam dosing. Patient underwent EGD on 10/8. Patient had hypoxia after procedure; O2 sats in the 60s. Abdominal x-ray shows large pneumoperitoneum.  Plan: Initiate Unasyn 3 g every 6 hours. Patient has had Unasyn upon most recent admission on 9/25. Spoke with nurse in ICU - unaware of allergy noted in chart.  Height: 5\' 11"  (180.3 cm) Weight: 78 lb 7.7 oz (35.6 kg) IBW/kg (Calculated) : 75.3  Temp (24hrs), Avg:98 F (36.7 C), Min:97.6 F (36.4 C), Max:98.6 F (37 C)  Recent Labs  Lab 06/15/18 0557 06/18/18 0426 06/19/18 1859 06/20/18 0453 06/26/2018 0426  WBC 6.5 8.2  --  11.9*  --   CREATININE 0.65 0.63 0.58*  --  0.60*    Estimated Creatinine Clearance: 41.4 mL/min (A) (by C-G formula based on SCr of 0.6 mg/dL (L)).    Allergies  Allergen Reactions  . Penicillins     Antimicrobials this admission: 10/8 Unasyn >> 10/7 Augmentin >> 10/8 9/25 Ceftriaxone >> 9/30      Microbiology results: 10/8 Sputum: sent  10/8 MRSA PCR: (-)  Thank you for allowing pharmacy to be a part of this patient's care.    Mauri Reading, PharmD Pharmacy Resident  06/16/2018 7:26 PM

## 2018-06-21 NOTE — Progress Notes (Addendum)
Pt had hypoxia after his procedure.  This is likely due to post sedation.  Anesthesia at bedside. I talked to Alexander Lara, patient's son and decision maker.  I discussed that patient is DO NOT INTUBATE and if the hypoxia continues there is a chance that patient may pass away if hypoxia continues despite the oxygen mask that he is wearing right now.  Pulse ox is in the 60s at this time.  Alexander Lara has made the decision that he would like the patient intubated.  The risk of inability to wean off vent was discussed and Alexander Lara would still like to proceed with intubation.  This conversation was witnessed by nurse Harriett Sine as well.  Chest and abdominal xray has been ordered and is pending.  Some free air is expected on the abdominal xray as the incision for PEG tube was made today. See EGD report for details.   Anesthesia is evaluating the patient, O2 sat In the 70s. They are getting an ABG to decide if they would like to intubate. Dr. Consuella Lose is the anesthesiologist present on the case at this time.

## 2018-06-21 NOTE — Consult Note (Signed)
PULMONARY / CRITICAL CARE MEDICINE   Name: Alexander Lara MRN: [id number] DOB: 1946/08/25    ADMISSION DATE:  05/24/2018 CONSULTATION DATE:  06/22/2018  REFERRING MD:  Dr. Bonna Gains  CHIEF COMPLAINT:  Acute Hypoxic Respiratory Failure  SHORT DISCUSSION: 72 y.o. Male admitted with Proteus UTI , Bacteremia, and sepsis from chronic indwelling foley.  Pt has a history of Chronic subdural hematoma, chronic aspiration, and severe malnutrition.  On 10/8 he underwent EGD for PEG placement, of which attempt was aborted due to blade entering the stomach.  Post procedure pt developed severe Acute Hypoxic Respiratory Failure necessitating intubation.  Follow up CXR with LLL infiltrate concerning for aspiration.  Follow up Abdominal X-ray with large acute Pneumoperitoneum concerning for possible Abdominal perforation.  Surgery consulted, will take pt to OR on 10/8 emergently for Exploratory Laparotomy.  HISTORY OF PRESENT ILLNESS:   Alexander Lara is a 72 y.o. Male with a PMH of urinary retention requiring chronic foley, chronic stable subdural hematoma, severe malnutrition, and chronic aspiration  who [facility] with c/o dark cloudy urine.  He was admitted to [facility] unit for treatment of Proteus UTI, Bacteremia, and sepsis from his Indwelling foley catheter. Pt with chronic aspiration, and failed his swallow evaluation. Pt's family was agreeable to placement of PEG tube. On 07/13/2018 pt underwent EGD for gastric tube placement, however the attempt had to be aborted as during the initial incision was made the blade was seen in the stomach.  Post procedure pt was noted to be severely hypoxic with O2 sats in the 60's despite supplemental O2, and subsequently had to be intubated.  CXR with new LLL infiltrate concerning for aspiration.  Pt was transferred to ICU post procedure.  PCCM was consulted for further management.  Follow up Abdominal X-ray noted large amount of  Pneumoperitoneum (greater than expected from EGD / PEG incision), with concern for questionable abdominal perforation. STAT Surgery consultation has been obtained, Dr. Beckey Rutter notified and at bedside to assess pt.  Pt to possibly go to OR for emergent Exploratory Laparotomy.  PAST MEDICAL HISTORY :  He  has a past medical history of Anxiety, Emphysema of lung ([place]), Foley catheter in place, Seizures ([place]), Subdural hematoma ([place]), and Urinary retention.  PAST SURGICAL HISTORY: He  has no past surgical history on file.  Allergies  Allergen Reactions  . Penicillins     No current facility-administered medications on file prior to encounter.    Current Outpatient Medications on File Prior to Encounter  Medication Sig  . acetaminophen (TYLENOL) 325 MG tablet Take 650 mg by mouth every 6 (six) hours as needed for mild pain.  [name] docusate (COLACE) 50 MG/5ML liquid Take 100 mg by mouth daily.  [name] ENSURE PLUS (ENSURE PLUS) LIQD Take 237 mLs by mouth 2 (two) times daily between meals.  . ferrous sulfate 220 (44 Fe) MG/5ML solution Take 330 mg by mouth 2 (two) times daily with a meal.  . levETIRAcetam (KEPPRA) 100 MG/ML solution Take 500 mg by mouth 2 (two) times daily.  . magnesium oxide (MAG-OX) 400 (241.3 Mg) MG tablet Take 1 tablet (400 mg total) by mouth daily.  . megestrol (MEGACE) 400 MG/10ML suspension Take 20 mLs (800 mg total) by mouth daily.  . Multiple Vitamin (MULTIVITAMIN) LIQD Take 15 mLs by mouth daily.  . phosphorus (K PHOS NEUTRAL) 155-852-130 MG tablet Take 2 tablets (500 mg total) by mouth daily.  . tamsulosin (FLOMAX) 0.4 MG CAPS capsule Take  1 capsule (0.4 mg total) by mouth daily.  . vitamin C (VITAMIN C) 250 MG tablet Take 1 tablet (250 mg total) by mouth 2 (two) times daily.    FAMILY HISTORY:  His has no family status information on file.    SOCIAL HISTORY: He  reports that he has quit smoking. He has never used smokeless tobacco. He reports that he does not drink  alcohol or use drugs.  REVIEW OF SYSTEMS:   Unable to assess as pt is intubated and sedated  SUBJECTIVE:  Unable to assess as pt is intubated and sedated  VITAL SIGNS: BP (!) 145/92   Pulse (!) 103   Temp (!) 97.4 F (36.3 C) (Axillary)   Resp (!) 21   Ht '5\' 11"'  (1.803 m)   Wt 35.6 kg   SpO2 100%   BMI 10.95 kg/m   HEMODYNAMICS:    VENTILATOR SETTINGS: Vent Mode: PRVC FiO2 (%):  [80 %-100 %] 80 % Set Rate:  [14 bmp] 14 bmp Vt Set:  [450 mL-500 mL] 500 mL PEEP:  [5 cmH20-8 cmH20] 5 cmH20  INTAKE / OUTPUT: I/O last 3 completed shifts: In: 2543.7 [I.V.:1827.9; NG/GT:562.5; IV Piggyback:153.3] Out: 950 [Urine:950]  PHYSICAL EXAMINATION: General:  Acutely ill appearing cachectic male, laying in bed, intubated, in NAD Neuro:  Withdraws from pain, Pupils PERRLA 2 mm sluggish bilaterally HEENT:  Atraumatic, normocephalic, neck supple, No JVD Cardiovascular:  Irregularly irregular rhythm, no M/R/G, 2+ radial pulses bilaterally, 1+ pedal pulses bilaterally Lungs:  Clear, rhonchi LLL, even, non-labored, vent-assisted Abdomen: Soft, nontender Musculoskeletal:  No edema, no deformities Skin:  Incision Left abdomen, well approximated, soft, tender  LABS:  BMET Recent Labs  Lab 06/18/18 0426  06/19/18 1859 06/20/18 0453 07/03/2018 0426  NA 146*  --  137  --  137  K 2.8*   < > 3.6 3.6 3.8  CL 118*  --  111  --  110  CO2 22  --  21*  --  22  BUN 13  --  15  --  15  CREATININE 0.63  --  0.58*  --  0.60*  GLUCOSE 117*  --  162*  --  93   < > = values in this interval not displayed.    Electrolytes Recent Labs  Lab 06/18/18 0426 06/19/18 0514 06/19/18 1859 06/20/18 0453 06/14/2018 0426  CALCIUM 7.7*  --  7.2*  --  7.5*  MG 1.8 1.7  --  1.8 2.0  PHOS 1.7* 1.8*  --  2.5 2.5    CBC Recent Labs  Lab 06/18/18 0426 06/20/18 0453 07/02/2018 2002  WBC 8.2 11.9* PENDING  HGB 8.8* 8.4* 7.9*  HCT 26.7* 25.7* 25.0*  PLT 256 225 258    Coag's Recent Labs  Lab  06/20/18 0453  INR 1.16    Sepsis Markers No results for input(s): LATICACIDVEN, PROCALCITON, O2SATVEN in the last 168 hours.  ABG Recent Labs  Lab 07/12/2018 1632  PHART 7.51*  PCO2ART 22*  PO2ART 40*    Liver Enzymes Recent Labs  Lab 06/19/18 1859  AST 18  ALT 15  ALKPHOS 45  BILITOT 0.4  ALBUMIN 2.1*    Cardiac Enzymes No results for input(s): TROPONINI, PROBNP in the last 168 hours.  Glucose No results for input(s): GLUCAP in the last 168 hours.  Imaging Dg Chest 1 View  Result Date: 07/14/2018 CLINICAL DATA:  Intubation.  Postop gastrostomy. EXAM: CHEST  1 VIEW COMPARISON:  07/13/2018 FINDINGS: Endotracheal tube in good position.  Left subclavian central venous catheter tip mid SVC. No pneumothorax. Left lower lobe infiltrate unchanged, possible pneumonia. No effusion. COPD Large pneumoperitoneum again noted and unchanged IMPRESSION: Central line SVC. No pneumothorax. Endotracheal tube in good position. Left lower lobe infiltrate Large pneumoperitoneum Electronically Signed   By: Franchot Gallo M.D.   On: 06/26/2018 18:24   Dg Chest 1 View  Addendum Date: 07/12/2018   ADDENDUM REPORT: 06/18/2018 16:58 ADDENDUM: These results were called by telephone at the time of interpretation on 07/05/2018 at 4:58 pm to Dr. Martha Clan , who verbally acknowledged these results. Electronically Signed   By: Franchot Gallo M.D.   On: 06/22/2018 16:58   Result Date: 06/26/2018 CLINICAL DATA:  Oxygen desaturation.  Endoscopy today EXAM: CHEST  1 VIEW COMPARISON:  05/25/2018 FINDINGS: Left lower lobe airspace disease, possible pneumonia. Minimal right lower lobe atelectasis. Negative for heart failure or edema. Apical scarring bilaterally Left arm PICC tip in the SVC. Large pneumoperitoneum IMPRESSION: 1. Left lower lobe infiltrate, possible aspiration pneumonia 2. Large pneumoperitoneum Electronically Signed: By: Franchot Gallo M.D. On: 06/18/2018 16:52   Dg Abd 1 View  Result Date:  06/24/2018 CLINICAL DATA:  Low oxygen saturation.  Feeding tube placement. EXAM: ABDOMEN - 1 VIEW COMPARISON:  06/17/2018 FINDINGS: Large pneumoperitoneum Metal clips overlying the stomach. No gastrostomy tube is identified. Foley catheter in the bladder.  No acute bony abnormality. IMPRESSION: Large pneumoperitoneum. Metal clips overlying the stomach presumably related to gastrostomy tube placement. These results were called by telephone at the time of interpretation on 07/10/2018 at 4:56 pm to Dr. Rosey Bath, who verbally acknowledged these results. Electronically Signed   By: Franchot Gallo M.D.   On: 07/11/2018 16:58     STUDIES: . CT  Chest / Abdomen / Pelvis 06/13/18>> Patchy densities in the right lower lobe and inferior right upper lobe, compatible with incompletely resolved pneumonia. Follow-up chest radiographs are recommended until this completely clears. Mild-to-moderate changes of COPD. Small pericardial effusion. Multiple dependent calculi in the urinary bladder. 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  CT Head wo Contrast 06/16/18>> No acute intracranial abnormalities. 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 KUB 06/19/2018>> Large pneumoperitoneum. Metal clips overlying the stomach presumably related to gastrostomy tube placement.  CULTURES: Blood 9/25>> Proteus Mirabilis (pan sensitive), Clostridium species Urine 9/25>> Proteus Mirabilis (resistant to Nitrofurantoin) Sputum 10/8>>  ANTIBIOTICS: Rocephin 9/25>> 9/30 Flagyl 9/28>> 9/30 Augmentin 10/7>>10/7 Unasyn 9/30>>10/8 Zosyn 10/8>> Vancomycin 10/8>>  SIGNIFICANT EVENTS: 9/25>> Admission to [facility] 10/8>> EGD with attempt at PEG tube, aborted due to blade entering the stomach  10/8>> hypoxic post procedure requiring intubation 10/8>> Large Pneumoperitoneum on CXR with questionable abdominal perforation, STAT Surgery consult, to go  to OR for Exploratory Lapartomy  LINES/TUBES: ETT 07/06/2018>> Left Brachial PICC 06/13/18>>   ASSESSMENT / PLAN:  PULMONARY A: Acute Hypoxic Respiratory Failure in setting of ? Aspiration -CXR w/ LLL infiltrate 10/8 Hx: Chronic aspiration, Emphysema P:   -Full vent support, PRVC: 8 cc/kg -Wean FiO2 and Peep as tolerated to maintain O2 sats 88 to 94% -Follow intermittent CXR and ABG -SBT when parameters met -VAP Bundle  -Pulmonary hygiene -Continue Abx as above -Prn Bronchodilators  CARDIOVASCULAR A:  A-fib, rate currently controlled -No evidence of ischemia on EKG P:  -Cardiac monitoring -Prn Metoprolol IV for rate control -Maintain MAP >65 -Levophed if needed to maintain MAP goal -Maintenance IVF: D5LR w/ 20K @ 75 ml/hr -Will defer anticoagulation  at this time given surgery 10/8, will initiate when cleared by Surgery  RENAL A:   Anion gap Metabolic Acidosis, in setting of ? Abdominal perforation & Sepsis Mild Hyponatremia UTI (proteus mirabilis) Hx: Urinary retention with chronic foley P:   -Monitor I&O's / urinary output -Follow BMP -Ensure adequate renal perfusion -Avoid nephrotoxic agents as able -Replace electrolytes as indicated -Trend Lactic Acid -Maintenance IVF -Abx as above, has received 14 day course of Unasyn -Foley previously changed out this hospital admission   GASTROINTESTINAL A:   ? Abdominal Perforation s/p attempted PEG placement on 10/8 -Acute Large Pneumoperitoneum on Abdominal X-ray Malnutrition, Cachexia, and Failure to Thrive P:   -STAT Surgery consultation, appreciate input -Per Surgery, pt to go to OR tonight 10/8 for Exploratory Lapartomy -Surgery may place PEG tube during surgery  -Keep NPO for now -Will place on Zosyn -Dietary consult  HEMATOLOGIC A:   Acute anemia P:  -Monitor for s/sx of bleeding -Trend CBC -SCD's for VTE Prophylaxis; will defer pharmacologic anticoagulation at this time given pt is going to OR 10/8,  will check with surgery as to when can be resumed -Transfuse for Hgb <7.0  INFECTIOUS A:   ? Abdominal perforation s/p attempted PEG placement -Large Pneumoperitoneum on Abdominal X-ray post attempted PEG placement Aspiration PNA Bacteremia (proteus mirabilis & Clostridium species) UTI (proteus Mirabilis) P:   -Monitor fever curve -Trend WBC's -Trend Lactic Acid and Procalcitonin -Follow cultures as above -Received 14 day course of Unasyn -Will change Unasyn to Zosyn, continue Vancomycin  ENDOCRINE A:   Hyperglycemia  P:   -CBG's -SSI -Follow ICU Hypo/hyperglycemia protocol  NEUROLOGIC A:   Sedation needs in setting of mechanical ventilation Hx: Seizures, Subdural hematoma, Anxiety -CT Head 10/3 negative for acute intracranial process P:   -RASS goal: 0 to -1 -Prn Fentanyl and Versed to maintain RASS goal -Daily WUA -Avoid sedating meds as able -Continue IV Keppra -Provide supportive care -Lights on during the day    FAMILY  - Updates: Myself and Dr. Beckey Rutter Updated pt's finance and sister at bedside, and also updated pt's son Zyrion Coey [facility]) via telephone on 10/8.  Pt's son and Domingo Mend' are agreeable to surgery.   Confirmed pt is a DNR/DNI.  - Inter-disciplinary family meet or Palliative Care meeting due by:  06/28/2018    Darel Hong, AGACNP-BC [facility] Pager: [phone]   07/04/2018, 8:19 PM

## 2018-06-21 NOTE — Consult Note (Signed)
SURGICAL CONSULTATION NOTE   HISTORY OF PRESENT ILLNESS (HPI):  72 y.o. male admitted due to sepsis due to indwelling Foley catheter, bacteremia, chronic aspiration and malnutrition. Upon evaluation the patient is sedated on Mechanical ventilation and the history was given Nurse Practitioner. The patient had an attempted PEG placed this afternoon. There is a report of the procedure at 4:30 pm describing the procedure were the blade was seen getting into the stomach. There is an Xray with large amount of pneumoperitoneum at 4:35 pm. It is not until 8 pm that I was contacted for the consulted for evaluation and recommendations.  Upon getting into the room, the sister and the fiance were present in the room. I explained the situation to them regarding the perforation of the stomach and the large amount of the pneumoperitoneum. Since an NGT was not placed during the endoscopy, a contrast study is not able to be done for evaluation of leakage.  Surgery is consulted by Harlon Ditty, NP in this context for evaluation and management of suspected gastric perforation after PEG placement.  PAST MEDICAL HISTORY (PMH):  Past Medical History:  Diagnosis Date  . Anxiety   . Emphysema of lung (HCC)   . Foley catheter in place   . Seizures (HCC)   . Subdural hematoma (HCC)   . Urinary retention      PAST SURGICAL HISTORY (PSH):  History reviewed. No pertinent surgical history.   MEDICATIONS:  Prior to Admission medications   Medication Sig Start Date End Date Taking? Authorizing Provider  acetaminophen (TYLENOL) 325 MG tablet Take 650 mg by mouth every 6 (six) hours as needed for mild pain.   Yes [provider]  docusate (COLACE) 50 MG/5ML liquid Take 100 mg by mouth daily.   Yes [provider]  ENSURE PLUS (ENSURE PLUS) LIQD Take 237 mLs by mouth 2 (two) times daily between meals.   Yes [provider]  ferrous sulfate 220 (44 Fe) MG/5ML solution Take 330 mg by mouth 2  (two) times daily with a meal.   Yes [provider]  levETIRAcetam (KEPPRA) 100 MG/ML solution Take 500 mg by mouth 2 (two) times daily.   Yes [provider]  magnesium oxide (MAG-OX) 400 (241.3 Mg) MG tablet Take 1 tablet (400 mg total) by mouth daily. 05/19/18  Yes Enedina Finner, MD  megestrol (MEGACE) 400 MG/10ML suspension Take 20 mLs (800 mg total) by mouth daily. 05/19/18  Yes Enedina Finner, MD  Multiple Vitamin (MULTIVITAMIN) LIQD Take 15 mLs by mouth daily.   Yes [provider]  phosphorus (K PHOS NEUTRAL) 155-852-130 MG tablet Take 2 tablets (500 mg total) by mouth daily. 05/19/18  Yes Enedina Finner, MD  tamsulosin (FLOMAX) 0.4 MG CAPS capsule Take 1 capsule (0.4 mg total) by mouth daily. 05/19/18  Yes Enedina Finner, MD  vitamin C (VITAMIN C) 250 MG tablet Take 1 tablet (250 mg total) by mouth 2 (two) times daily. 05/18/18  Yes Enedina Finner, MD     ALLERGIES:  Allergies  Allergen Reactions  . Penicillins      SOCIAL HISTORY:  Social History   Socioeconomic History  . Marital status: Divorced    Spouse name: Not on file  . Number of children: Not on file  . Years of education: Not on file  . Highest education level: Not on file  Occupational History  . Not on file  Social Needs  . Financial resource strain: Not on file  . Food insecurity:  Worry: Not on file    Inability: Not on file  . Transportation needs:    Medical: Not on file    Non-medical: Not on file  Tobacco Use  . Smoking status: Former Games developer  . Smokeless tobacco: Never Used  Substance and Sexual Activity  . Alcohol use: No    Frequency: Never  . Drug use: No  . Sexual activity: Not on file  Lifestyle  . Physical activity:    Days per week: Not on file    Minutes per session: Not on file  . Stress: Not on file  Relationships  . Social connections:    Talks on phone: Not on file    Gets together: Not on file    Attends religious service: Not on file    Active member of club or  organization: Not on file    Attends meetings of clubs or organizations: Not on file    Relationship status: Not on file  . Intimate partner violence:    Fear of current or ex partner: Not on file    Emotionally abused: Not on file    Physically abused: Not on file    Forced sexual activity: Not on file  Other Topics Concern  . Not on file  Social History Narrative  . Not on file    FAMILY HISTORY:  History reviewed. No pertinent family history.   REVIEW OF SYSTEMS:  Unable to get ROS due to sedation  VITAL SIGNS:  Temp:  [97.4 F (36.3 C)-98.6 F (37 C)] 97.4 F (36.3 C) (10/08 1956) Pulse Rate:  [66-104] 103 (10/08 1845) Resp:  [19-23] 21 (10/08 1845) BP: (99-152)/(60-99) 145/92 (10/08 1956) SpO2:  [63 %-100 %] 100 % (10/08 2022) FiO2 (%):  [80 %-100 %] 80 % (10/08 2022) Weight:  [32.7 kg-35.6 kg] 35.6 kg (10/08 1747)     Height: 5\' 11"  (180.3 cm) Weight: 35.6 kg BMI (Calculated): 10.95   INTAKE/OUTPUT:  This shift: No intake/output data recorded.  Last 2 shifts: @IOLAST2SHIFTS @   PHYSICAL EXAM:  Constitutional:  -- Cachectic  -- Sedated, critically ill  Ear, nose, and throat:  -- No jugular venous distension  Pulmonary:  -- No respiratory distress on sedation  -- Equal breath sounds bilaterally Cardiovascular:  -- regular rate Gastrointestinal:  -- Abdomen soft, distended, tympanic, no guarding or rebound tenderness -- small incision with dry blood   Labs:  CBC Latest Ref Rng & Units 07/06/2018 06/20/2018 06/18/2018  WBC 4.0 - 10.5 K/uL PENDING 11.9(H) 8.2  Hemoglobin 13.0 - 17.0 g/dL 7.9(L) 8.4(L) 8.8(L)  Hematocrit 39.0 - 52.0 % 25.0(L) 25.7(L) 26.7(L)  Platelets 150 - 400 K/uL 258 225 256   CMP Latest Ref Rng & Units 06/20/2018 06/15/2018 06/20/2018  Glucose 70 - 99 mg/dL 161(W) 93 -  BUN 8 - 23 mg/dL 14 15 -  Creatinine 9.60 - 1.24 mg/dL 4.54(U) 9.81(X) -  Sodium 135 - 145 mmol/L 133(L) 137 -  Potassium 3.5 - 5.1 mmol/L 3.9 3.8 3.6  Chloride 98 - 111  mmol/L 106 110 -  CO2 22 - 32 mmol/L 19(L) 22 -  Calcium 8.9 - 10.3 mg/dL 7.5(L) 7.5(L) -  Total Protein 6.5 - 8.1 g/dL - - -  Total Bilirubin 0.3 - 1.2 mg/dL - - -  Alkaline Phos 38 - 126 U/L - - -  AST 15 - 41 U/L - - -  ALT 0 - 44 U/L - - -   Imaging studies:  EXAM: ABDOMEN - 1 VIEW  COMPARISON:  06/17/2018  FINDINGS: Large pneumoperitoneum  Metal clips overlying the stomach. No gastrostomy tube is identified.  Foley catheter in the bladder.  No acute bony abnormality.  IMPRESSION: Large pneumoperitoneum. Metal clips overlying the stomach presumably related to gastrostomy tube placement.  These results were called by telephone at the time of interpretation on 06/16/2018 at 4:56 pm to Dr. Karlton Lemon, who verbally acknowledged these results.   Electronically Signed   By: Marlan Palau M.D.   On: 06/14/2018 16:58   Assessment/Plan: 72 y.o. male with large amount of pneumoperitoneum from complication from a PEG placement. , PEG attempt at 4pm, Now with consult due to pneumoperitoneum. Long discussion with sister and fiance about Xray finding and patient condition. From surgical standpoint I cannot recommend any further study. My recommendation is an exploratory laparotomy for evaluation of the stomach. The sooner that this is done the better the chances for the patient to recover if there is a leak from the stomach. Fiance was defensive regarding making a surgical decision at this moment. She requested another Xray. I oriented her and the sister that my goal is to help the patient diagnosing and repairing the stomach perforation. I understand the patient current clinical status but if he is leaking from the stomach, this will be the only chance. On the other side I cannot guarantee that he is actively leaking and the exploratory laparotomy can be negative. I also offer placing a gastrostomy tube while there. Again oriented that the chances of complication of putting a  gastrostomy, hours after perforation are higher including infection, dislodgement, fistulas, non healing wounds, among others.  I will follow the xray and be aware of case to offer my service to try to help this patient. I respect family decision as we are all trying to give this patient the best quality of life.   Gae Gallop, MD

## 2018-06-21 NOTE — Progress Notes (Signed)
Patient ID: Alexander Lara, male   DOB: 1946/08/08, 72 y.o.   MRN: [id number]   [employer] PROGRESS NOTE  Reggie Bise WUJ:[id number] DOB: Jul 24, 1946 DOA: 06/05/2018 [facility]CP: [facility]  HPI/Subjective: Patient awaiting PEG tube  Objective: Vitals:   06/27/2018 0449 07/05/2018 1301  BP: 136/90 99/60  Pulse: 86 74  Resp: 20 20  Temp: 98 F (36.7 C) 98.6 F (37 C)  SpO2: 95%     Filed Weights   06/10/18 0338 06/15/18 0345 07/11/2018 1100  Weight: 29.9 kg 31.3 kg 32.7 kg    ROS: Review of Systems  Unable to perform ROS: Acuity of condition  Respiratory: Positive for cough. Negative for shortness of breath.   Cardiovascular: Negative for chest pain.  Gastrointestinal: Negative for abdominal pain and vomiting.   Exam: Physical Exam  Constitutional: He appears cachectic.  HENT:  Nose: No mucosal edema.  Mouth/Throat: No oropharyngeal exudate or posterior oropharyngeal edema.  Eyes: Pupils are equal, round, and reactive to light. Conjunctivae and lids are normal.  Neck: No JVD present. Carotid bruit is not present. No edema present. No thyroid mass and no thyromegaly present.  Cardiovascular: S1 normal and S2 normal. An irregularly irregular rhythm present. Exam reveals no gallop.  No murmur heard. Pulses:      Dorsalis pedis pulses are 2+ on the right side, and 2+ on the left side.  Respiratory: No respiratory distress. He has no wheezes. He has no rhonchi. He has no rales.  GI: Soft. Bowel sounds are normal. There is no tenderness.  Musculoskeletal:       Right ankle: He exhibits no swelling.       Left ankle: He exhibits no swelling.  Lymphadenopathy:    He has no cervical adenopathy.  Neurological: He is alert.  Skin: Skin is warm. No rash noted. Nails show no clubbing.  Psychiatric:  Alert but not answering questions      Data Reviewed: Basic Metabolic Panel: Recent Labs  Lab 06/15/18 0557 06/18/18 0426 06/18/18 1821 06/19/18 0514 06/19/18 1859  06/20/18 0453 07/08/2018 0426  NA 148* 146*  --   --  137  --  137  K 3.2* 2.8* 3.2* 3.7 3.6 3.6 3.8  CL 118* 118*  --   --  111  --  110  CO2 25 22  --   --  21*  --  22  GLUCOSE 100* 117*  --   --  162*  --  93  BUN 23 13  --   --  15  --  15  CREATININE 0.65 0.63  --   --  0.58*  --  0.60*  CALCIUM 7.9* 7.7*  --   --  7.2*  --  7.5*  MG  --  1.8  --  1.7  --  1.8 2.0  PHOS  --  1.7*  --  1.8*  --  2.5 2.5   CBC: Recent Labs  Lab 06/15/18 0557 06/18/18 0426 06/20/18 0453  WBC 6.5 8.2 11.9*  HGB 8.2* 8.8* 8.4*  HCT 24.3* 26.7* 25.7*  MCV 79.2* 78.1* 79.2*  PLT 239 256 225   Cardiac Enzymes:   Recent Results (from the past 240 hour(s))  MRSA PCR Screening     Status: None   Collection Time: 06/15/2018 11:46 AM  Result Value Ref Range Status   MRSA by PCR NEGATIVE NEGATIVE Final    Comment:        The GeneXpert MRSA Assay (FDA approved for  NASAL specimens only), is one component of a comprehensive MRSA colonization surveillance program. It is not intended to diagnose MRSA infection nor to guide or monitor treatment for MRSA infections. Performed at [facility], [address], [place], [place] [address]      Studies: No results found.  Scheduled Meds: . [MAR Hold] amoxicillin-clavulanate  500 mg Per Tube Q12H  . [MAR Hold] free water  30 mL Per Tube Q4H  . [MAR Hold] levETIRAcetam  500 mg Per Tube BID  . [MAR Hold] mouth rinse  15 mL Mouth Rinse BID  . [MAR Hold] multivitamin  15 mL Per Tube Daily  . [MAR Hold] potassium & sodium phosphates  2 packet Per Tube BID AC  . [MAR Hold] sodium chloride flush  10-40 mL Intracatheter Q12H  . [MAR Hold] vitamin C  250 mg Per Tube BID   Continuous Infusions: . [MAR Hold] sodium chloride 0 mL/hr at 06/20/18 0407  . sodium chloride Stopped (06/20/18 2053)  . sodium chloride    . sodium chloride 1,000 mL (Jul 19, 2018 1306)  . dextrose 5 % and 0.9 % NaCl with KCl 20 mEq/L 40 mL/hr at 07/19/2018 0300  . feeding  supplement (JEVITY 1.5 CAL/FIBER)    . vancomycin      Assessment/Plan:   1. Proteus sepsis from indwelling Foley catheter.  Blood cultures and urine cultures positive.  Foley catheter changed.  This will have to be changed every 4 weeks.  Continue Unasyn  2. Clostridium species also and blood culture.  Continue IV Unasyn which would cover Clostridium species also.  Family decided against colonoscopy at this point. 3. Failed swallow evaluation.  EGD today with gastric tube placement per GI  4. 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  CT scan not showing any signs of malignancy at this point.  Unasyn for chronic aspiration 5. Atrial fibrillation with rapid ventricular response.  Metoprolol changed to IV as needed 6. History of seizures on Keppra 7. Failure to thrive, cachexia and malnutrition.  Overall prognosis is poor.  Patient is a DNR.   8. BPH.  meds on hold   Code Status:     Code Status Orders  (From admission, onward)         Start     Ordered   06/10/18 1120  Do not attempt resuscitation (DNR)  Continuous    Question Answer Comment  In the event of cardiac or respiratory ARREST Do not call a "code blue"   In the event of cardiac or respiratory ARREST Do not perform Intubation, CPR, defibrillation or ACLS   In the event of cardiac or respiratory ARREST Use medication by any route, position, wound care, and other measures to relive pain and suffering. May use oxygen, suction and manual treatment of airway obstruction as needed for comfort.      06/10/18 1121        Code Status History    Date Active Date Inactive Code Status Order ID Comments User Context   06/09/2018 0209 06/10/2018 1121 DNR [id number]  Barbaraann Rondo, MD ED   05/15/2018 1120 05/18/2018 2303 Full Code [id number]  Auburn Bilberry, MD Inpatient   05/15/2018 0125 05/15/2018 1120 Full Code [id number]  Cammy Copa, MD Inpatient    Advance Directive Documentation      Most Recent Value  Type of Advance Directive  Healthcare Power of Attorney  Pre-existing out of facility DNR order (yellow form or pink MOST form)  -  "  MOST" Form in Place?  -     Disposition Plan: Will not be able to get PEG tube until next week  Antibiotics:  Unasyn  Time spent: 25 minutes.  Spoke with fianc on the phone today.  Sister did not pick up the phone when I called her.  Addysin Porco [employer]

## 2018-06-21 NOTE — Anesthesia Preprocedure Evaluation (Addendum)
Anesthesia Evaluation  Patient identified by MRN, date of birth, ID band Patient unresponsive    Reviewed: Allergy & Precautions, H&P , NPO status , Patient's Chart, lab work & pertinent test results  History of Anesthesia Complications Negative for: history of anesthetic complications  Airway Mallampati: III  TM Distance: >3 FB Neck ROM: full    Dental  (+) Chipped, Poor Dentition   Pulmonary pneumonia, COPD, former smoker,           Cardiovascular      Neuro/Psych Seizures -,  Anxiety CVA (subdural hematoma) negative psych ROS   GI/Hepatic negative GI ROS, Neg liver ROS,   Endo/Other  negative endocrine ROS  Renal/GU negative Renal ROS  negative genitourinary   Musculoskeletal   Abdominal   Peds  Hematology negative hematology ROS (+)   Anesthesia Other Findings Past Medical History: No date: Anxiety No date: Emphysema of lung (HCC) No date: Foley catheter in place No date: Seizures (HCC) No date: Subdural hematoma (HCC) No date: Urinary retention  History reviewed. No pertinent surgical history.  BMI    Body Mass Index:  9.62 kg/m      Reproductive/Obstetrics negative OB ROS                           Anesthesia Physical Anesthesia Plan  ASA: IV  Anesthesia Plan: General   Post-op Pain Management:    Induction: Intravenous  PONV Risk Score and Plan: Propofol infusion and TIVA  Airway Management Planned: Natural Airway and Nasal Cannula  Additional Equipment:   Intra-op Plan:   Post-operative Plan:   Informed Consent: I have reviewed the patients History and Physical, chart, labs and discussed the procedure including the risks, benefits and alternatives for the proposed anesthesia with the patient or authorized representative who has indicated his/her understanding and acceptance.   Dental Advisory Given  Plan Discussed with: Anesthesiologist, CRNA and  Surgeon  Anesthesia Plan Comments: (Plan to suspend DNR for procedure.  Son voiced understanding.  Leslie, Langille, [phone], consented by phone, for risks of anesthesia including but not limited to:  - adverse reactions to medications - risk of intubation if required - damage to teeth, lips or other oral mucosa - sore throat or hoarseness - Damage to heart, brain, lungs or loss of life  He voiced understanding.)        Anesthesia Quick Evaluation

## 2018-06-21 NOTE — Op Note (Signed)
[facility]astroenterology Patient Name: Alexander Lara Procedure Date: 07/13/2018 3:35 PM MRN: [id number] Account #: [id number] Date of Birth: Jan 17, 1946 Admit Type: Inpatient Age: 72 Room: [facility] ENDO ROOM 1 Gender: Male Note Status: Finalized Procedure:            Upper GI endoscopy Indications:          Place PEG due to neurological disorder causing impaired                        swallowing Providers:            Varnita B. Maximino Greenland MD, MD (Performed EGD part), Dr.                        Lannette Donath (Performed PEG incision and PEG part) Referring MD:         No Local Md, MD (Referring MD) Medicines:            Monitored Anesthesia Care Complications:        No immediate complications. Procedure:            Pre-Anesthesia Assessment:                       - The risks and benefits of the procedure and the                        sedation options and risks were discussed with the                        patient. All questions were answered and informed                        consent was obtained.                       - Patient identification and proposed procedure were                        verified prior to the procedure.                       - ASA Grade Assessment: III - A patient with severe                        systemic disease.                       After obtaining informed consent, the endoscope was                        passed under direct vision. Throughout the procedure,                        the patient's blood pressure, pulse, and oxygen                        saturations were monitored continuously. The Endoscope                        was introduced through the mouth, and advanced to the  second part of duodenum. The upper GI endoscopy was                        accomplished with ease. The patient tolerated the                        procedure well. Findings:      The examined esophagus was normal.      The entire  examined stomach was normal. Placement of an externally       removable PEG with no T-fasteners was attempted but could not be       successfully completed. The needle/trocar was not passed.       Transillumination technique and good 1:1 was noted for appropriate PEG       tube placement. PEG tube was attempted by Dr. Allegra Lai. Due to patient's       cachexia as soon as she made the incision the blade was seen within the       stomach and the blade was immediately withdrawn. Pressure was held at       the site. Good 1:1 could not found at the same site again for PEG       insertion. Therefore decision was made not to place the PEG tube. 2       clips were placed the site to close the defect. No bleeding seen at the       end of the procedure. No crepitus noted at the end of the exam.       Abdominal exam normal at the end of the exam. Tegaderm dressing placed       externally on abdominal wall. No bleeding noted externally.      The examined duodenum was normal. Impression:           - Normal esophagus.                       - Normal stomach.                       - Normal examined duodenum.                       - An externally removable PEG placement was attempted                        but could not be successfully completed. The                        needle/trocar was not passed.                       - No specimens collected. Recommendation:       - Consult Interventional radiology for feeding tube                        placement                       -Continue daily abdominal exam                       - If pt shows acute hemodynamic changes or acutely  changed abdominal exam, please obtain Abdominal Xray                        and consult surgery as appropriate                       - NPO today                       - If IR unable to place feeding tube tomorrow, place                        DHT for feeding temporarily. Procedure Code(s):    --- Professional  ---                       [phone], Esophagogastroduodenoscopy, flexible, transoral;                        diagnostic, including collection of specimen(s) by                        brushing or washing, when performed (separate procedure) Diagnosis Code(s):    --- Professional ---                       U04.540, Other symptoms and signs involving the nervous                        system                       R13.10, Dysphagia, unspecified                       Z43.1, Encounter for attention to gastrostomy CPT copyright 2018 American Medical Association. All rights reserved. The codes documented in this report are preliminary and upon coder review may  be revised to meet current compliance requirements.  Melodie Bouillon, MD Michel Bickers B. Maximino Greenland MD, MD Jul 17, 2018 4:28:28 PM This report has been signed electronically. Number of Addenda: 0 Note Initiated On: 2018/07/17 3:35 PM Estimated Blood Loss: Estimated blood loss: none.      [facility]

## 2018-06-21 NOTE — Progress Notes (Signed)
Pt disconnected from ventilator and transported to OR by OR team.

## 2018-06-21 NOTE — Progress Notes (Signed)
Nutrition Follow Up Note   DOCUMENTATION CODES:   Severe malnutrition in context of chronic illness  INTERVENTION:   Once PEG tube in place and ready for use, change to Jevity 1.5 @ goal rate of 74m/hr. Initiate at 241mhr and increase by 1055mr every 8 hrs until goal rate is reached.   Free water flushes 71m75m4 hrs  Regimen provides 1440kcal/day, 61g/day protein, 910ml36m free water, 21g/day fiber  Liquid MVI daily via tube  Vitamin C 250mg 80mvia tube  NUTRITION DIAGNOSIS:   Severe Malnutrition related to chronic illness(emphysema ) as evidenced by severe fat depletion, severe muscle depletion.  GOAL:   Patient will meet greater than or equal to 90% of their needs  -met with tube feeds  MONITOR:   Labs, Weight trends, TF tolerance, Skin, I & O's  ASSESSMENT:   [facility] urinary retention w/ foley catheter in place and failure to thrive. Previous admit 8/31PMH: emphysema, seizures, subdural heamtoma   Spoke with RN, pt tolerating tube feeding well at goal rate. NGT in place. Pt is having some loose stools. Plan is for PEG tube placement today. Will change pt to Jevity as this formula has more fiber and may help with loose stools. Recommend daily weights as pt on IVF and tube feeds. Refeed labs wnl.   Medications reviewed and include: augmentin, MVI, Vitamin C, NaCl w/ 5% dextrose and KCl '@40ml' /hr  Labs reviewed: Na 137 wnl, K 3.8 wnl, P 2.5 wnl, Mg 2.0 wnl  Diet Order:   Diet Order            Diet NPO time specified  Diet effective midnight             EDUCATION NEEDS:   No education needs have been identified at this time  Skin:  Skin Assessment: Reviewed RN Assessment(bilateral arm ecchymosis)  Last BM:  10/8- type 7  Height:   Ht Readings from Last 1 Encounters:  06/13/2018 '5\' 11"'  (1.803 m)    Weight:   Wt Readings from Last 1 Encounters:  06/15/18 31.3 kg    Ideal Body Weight:  78.2 kg  BMI:  Body mass  index is 9.62 kg/m.  Estimated Nutritional Needs:   Kcal:  1500-1800kcal/day   Protein:  54-60g/day   Fluid:  1.0L/day   Alexander Lara Koleen DistanceD, LDN Pager #- [phone]#- [phone] Hours Pager: [phone]

## 2018-06-21 NOTE — Brief Op Note (Signed)
I witnessed conversation between Dr. Maximino Greenland and pt's son, Breylen Agyeman, in which the son was informed of pt's decreasing oxygen saturations in postop area. Son told MD to intubate pt, and repeated that instruction to me on the phone.

## 2018-06-21 NOTE — Progress Notes (Signed)
Pharmacy Antibiotic Note  Alexander Lara is a 72 y.o. male admitted on 06/02/2018 with pneumonia.  Pharmacy has been consulted for zosyn dosing.  Plan: Zosyn 3.375g IV q8h (4 hour infusion).  Height: 5\' 11"  (180.3 cm) Weight: 78 lb 7.7 oz (35.6 kg) IBW/kg (Calculated) : 75.3  Temp (24hrs), Avg:98 F (36.7 C), Min:97.4 F (36.3 C), Max:98.6 F (37 C)  Recent Labs  Lab 06/15/18 0557 06/18/18 0426 06/19/18 1859 06/20/18 0453 07/06/2018 0426 06/30/2018 2002 07/13/2018 2049  WBC 6.5 8.2  --  11.9*  --  11.5*  --   CREATININE 0.65 0.63 0.58*  --  0.60* 0.51*  --   LATICACIDVEN  --   --   --   --   --   --  1.9    Estimated Creatinine Clearance: 41.4 mL/min (A) (by C-G formula based on SCr of 0.51 mg/dL (L)).    Allergies  Allergen Reactions  . Penicillins     Thank you for allowing pharmacy to be a part of this patient's care.  Thomasene Ripple, PharmD, BCPS Clinical Pharmacist 06/17/2018

## 2018-06-21 NOTE — Transfer of Care (Addendum)
Immediate Anesthesia Transfer of Care Note  Patient: Alexander Lara  Procedure(s) Performed: PERCUTANEOUS ENDOSCOPIC GASTROSTOMY (PEG) PLACEMENT (N/A )  Patient Location: Endoscopy Unit  Anesthesia Type:General  Level of Consciousness: drowsy  Airway & Oxygen Therapy: pt with labored breathing, decreased sats, decreased breath sounds to R base. MDA at bedside to evalutate.  awaiting stat cxr  Post-op Assessment: Report given to RN and Post -op Vital signs reviewed and unstable, Anesthesiologist notified  Post vital signs: Reviewed and unstable  Last Vitals:  Vitals Value Taken Time  BP 123/68 Jun 29, 2018  4:20 PM  Temp    Pulse 87 29-Jun-2018  4:25 PM  Resp 20 Jun 29, 2018  4:25 PM  SpO2 80 % 06/29/2018  4:25 PM  Vitals shown include unvalidated device data.  Last Pain:  Vitals:   06-29-18 1615  TempSrc:   PainSc: Asleep      Patients Stated Pain Goal: 0 (06/09/18 1753)  Complications: respiratory complications

## 2018-06-21 NOTE — Progress Notes (Signed)
Long conversation with son Alexander Lara was taken. Son was oriented about patient condition and xray. My recommendations of surgery were given. I oriented the son about the alternative of surgery or non surgical management. The risk of non having surgery with gastric perforation and worsening intra abdominal infection. Also the risk of having surgery and risk of anesthesia. He refers that he agrees to do everything possible to help him. Son consent to proceed with the surgery.

## 2018-06-21 NOTE — Anesthesia Post-op Follow-up Note (Signed)
Anesthesia QCDR form completed.        

## 2018-06-21 NOTE — Anesthesia Procedure Notes (Signed)
Procedure Name: Intubation Performed by: Aline Brochure, CRNA Pre-anesthesia Checklist: Patient identified, Patient being monitored, Timeout performed, Emergency Drugs available and Suction available Patient Re-evaluated:Patient Re-evaluated prior to induction Oxygen Delivery Method: Circle system utilized Preoxygenation: Pre-oxygenation with 100% oxygen Induction Type: IV induction Laryngoscope Size: Mac and 3 Grade View: Grade II Tube type: Oral Tube size: 7.5 mm Number of attempts: 1 Airway Equipment and Method: Stylet Placement Confirmation: ETT inserted through vocal cords under direct vision,  positive ETCO2 and breath sounds checked- equal and bilateral Secured at: 22 cm Tube secured with: Tape Dental Injury: Teeth and Oropharynx as per pre-operative assessment

## 2018-06-21 NOTE — OR Nursing (Signed)
Patient moved to Procedure Room 4 for intubation by Anesthesia.

## 2018-06-22 ENCOUNTER — Inpatient Hospital Stay: Payer: Medicare Other

## 2018-06-22 ENCOUNTER — Encounter: Payer: Self-pay | Admitting: Gastroenterology

## 2018-06-22 DIAGNOSIS — J9601 Acute respiratory failure with hypoxia: Secondary | ICD-10-CM

## 2018-06-22 LAB — BLOOD GAS, ARTERIAL
Acid-base deficit: 5 mmol/L — ABNORMAL HIGH (ref 0.0–2.0)
Bicarbonate: 17.8 mmol/L — ABNORMAL LOW (ref 20.0–28.0)
FIO2: 0.8
MECHANICAL RATE: 14
O2 Saturation: 100 %
PATIENT TEMPERATURE: 37
PEEP/CPAP: 5 cmH2O
VT: 500 mL
pCO2 arterial: 25 mmHg — ABNORMAL LOW (ref 32.0–48.0)
pH, Arterial: 7.46 — ABNORMAL HIGH (ref 7.350–7.450)
pO2, Arterial: 353 mmHg — ABNORMAL HIGH (ref 83.0–108.0)

## 2018-06-22 LAB — BASIC METABOLIC PANEL
Anion gap: 5 (ref 5–15)
BUN: 14 mg/dL (ref 8–23)
CALCIUM: 7.3 mg/dL — AB (ref 8.9–10.3)
CO2: 20 mmol/L — AB (ref 22–32)
Chloride: 112 mmol/L — ABNORMAL HIGH (ref 98–111)
Creatinine, Ser: 0.63 mg/dL (ref 0.61–1.24)
GFR calc Af Amer: >60 mL/min (ref >60)
GLUCOSE: 169 mg/dL — AB (ref 70–99)
POTASSIUM: 3.8 mmol/L (ref 3.5–5.1)
Sodium: 137 mmol/L (ref 135–145)

## 2018-06-22 LAB — CBC
HEMATOCRIT: 22.9 % — AB (ref 39.0–52.0)
Hemoglobin: 7.4 g/dL — ABNORMAL LOW (ref 13.0–17.0)
MCH: 26.1 pg (ref 26.0–34.0)
MCHC: 32.3 g/dL (ref 30.0–36.0)
MCV: 80.6 fL (ref 80.0–100.0)
PLATELETS: 261 10*3/uL (ref 150–400)
RBC: 2.84 MIL/uL — ABNORMAL LOW (ref 4.22–5.81)
RDW: 23.9 % — AB (ref 11.5–15.5)
WBC: 15.1 10*3/uL — AB (ref 4.0–10.5)
nRBC: 0 % (ref 0.0–0.2)

## 2018-06-22 LAB — LACTIC ACID, PLASMA: LACTIC ACID, VENOUS: 1 mmol/L (ref 0.5–1.9)

## 2018-06-22 LAB — GLUCOSE, CAPILLARY
GLUCOSE-CAPILLARY: 84 mg/dL (ref 70–99)
GLUCOSE-CAPILLARY: 133 mg/dL — AB (ref 70–99)
GLUCOSE-CAPILLARY: 82 mg/dL (ref 70–99)
Glucose-Capillary: 152 mg/dL — ABNORMAL HIGH (ref 70–99)
Glucose-Capillary: 93 mg/dL (ref 70–99)
Glucose-Capillary: 82 mg/dL (ref 70–99)
Glucose-Capillary: 114 mg/dL — ABNORMAL HIGH (ref 70–99)
Glucose-Capillary: 53 mg/dL — ABNORMAL LOW (ref 70–99)
Glucose-Capillary: 110 mg/dL — ABNORMAL HIGH (ref 70–99)

## 2018-06-22 LAB — TROPONIN I: Troponin I: <0.03 ng/mL (ref <0.03)

## 2018-06-22 LAB — MAGNESIUM: Magnesium: 1.8 mg/dL (ref 1.7–2.4)

## 2018-06-22 LAB — PROCALCITONIN
PROCALCITONIN: 0.17 ng/mL
Procalcitonin: 0.58 ng/mL

## 2018-06-22 LAB — PHOSPHORUS: Phosphorus: 2.9 mg/dL (ref 2.5–4.6)

## 2018-06-22 MED ORDER — VITAL 1.5 CAL PO LIQD
1000.0000 mL | ORAL | Status: DC
  Administered 2018-06-22: 1000 mL

## 2018-06-22 MED ORDER — DEXMEDETOMIDINE HCL IN NACL 400 MCG/100ML IV SOLN
0.4000 ug/kg/h | INTRAVENOUS | Status: DC
  Administered 2018-06-22: 0.4 ug/kg/h via INTRAVENOUS
  Administered 2018-06-23: 1 ug/kg/h via INTRAVENOUS
  Filled 2018-06-22 (×2): qty 100

## 2018-06-22 MED ORDER — FENTANYL BOLUS VIA INFUSION
25.0000 ug | INTRAVENOUS | Status: DC | PRN
  Filled 2018-06-22: qty 25

## 2018-06-22 MED ORDER — ORAL CARE MOUTH RINSE
15.0000 mL | OROMUCOSAL | Status: DC
  Administered 2018-06-22 – 2018-07-01 (×77): 15 mL via OROMUCOSAL

## 2018-06-22 MED ORDER — KCL IN DEXTROSE-NACL 20-5-0.9 MEQ/L-%-% IV SOLN
INTRAVENOUS | Status: DC
  Administered 2018-06-22: 02:00:00 via INTRAVENOUS
  Filled 2018-06-22 (×3): qty 1000

## 2018-06-22 MED ORDER — LACTATED RINGERS IV BOLUS
500.0000 mL | Freq: Once | INTRAVENOUS | Status: AC
  Administered 2018-06-23: 500 mL via INTRAVENOUS

## 2018-06-22 MED ORDER — FENTANYL CITRATE (PF) 100 MCG/2ML IJ SOLN: 50.0000 ug | Freq: Once | INTRAMUSCULAR | Status: DC

## 2018-06-22 MED ORDER — DEXTROSE 50 % IV SOLN
INTRAVENOUS | Status: AC
  Administered 2018-06-22: 50 mL via INTRAVENOUS
  Filled 2018-06-22: qty 50

## 2018-06-22 MED ORDER — NOREPINEPHRINE 4 MG/250ML-% IV SOLN
0.0000 ug/min | INTRAVENOUS | Status: DC
  Administered 2018-06-22: 1 ug/min via INTRAVENOUS
  Filled 2018-06-22: qty 250

## 2018-06-22 MED ORDER — FAMOTIDINE IN NACL 20-0.9 MG/50ML-% IV SOLN
20.0000 mg | INTRAVENOUS | Status: DC
  Administered 2018-06-22: 20 mg via INTRAVENOUS
  Filled 2018-06-22: qty 50

## 2018-06-22 MED ORDER — CHLORHEXIDINE GLUCONATE 0.12% ORAL RINSE (MEDLINE KIT)
15.0000 mL | Freq: Two times a day (BID) | OROMUCOSAL | Status: DC
  Administered 2018-06-22 – 2018-07-06 (×27): 15 mL via OROMUCOSAL

## 2018-06-22 MED ORDER — DEXTROSE 5 % IV SOLN
INTRAVENOUS | Status: DC
  Administered 2018-06-22 – 2018-06-23 (×2): via INTRAVENOUS

## 2018-06-22 MED ORDER — DEXTROSE 50 % IV SOLN
1.0000 | Freq: Once | INTRAVENOUS | Status: AC
  Administered 2018-06-22: 50 mL via INTRAVENOUS

## 2018-06-22 MED ORDER — MAGNESIUM SULFATE 2 GM/50ML IV SOLN
2.0000 g | Freq: Once | INTRAVENOUS | Status: AC
  Administered 2018-06-22: 2 g via INTRAVENOUS
  Filled 2018-06-22: qty 50

## 2018-06-22 MED ORDER — FENTANYL 2500MCG IN NS 250ML (10MCG/ML) PREMIX INFUSION
25.0000 ug/h | INTRAVENOUS | Status: DC
  Administered 2018-06-22: 50 ug/h via INTRAVENOUS
  Filled 2018-06-22: qty 250

## 2018-06-22 MED ORDER — SODIUM CHLORIDE 0.9 % IJ SOLN
60.0000 mL | INTRAMUSCULAR | Status: DC | PRN
  Administered 2018-06-22: 60 mL
  Filled 2018-06-22: qty 60

## 2018-06-22 MED ORDER — VITAL HIGH PROTEIN PO LIQD: 1000.0000 mL | ORAL | Status: DC

## 2018-06-22 MED ORDER — IOPAMIDOL (ISOVUE-300) INJECTION 61%
100.0000 mL | Freq: Once | INTRAVENOUS | Status: AC | PRN
  Administered 2018-06-22: 100 mL

## 2018-06-22 NOTE — Anesthesia Preprocedure Evaluation (Signed)
Anesthesia Evaluation  Patient identified by MRN, date of birth, ID band Patient unresponsive    Reviewed: Allergy & Precautions, H&P , NPO status , Patient's Chart, lab work & pertinent test results  History of Anesthesia Complications Negative for: history of anesthetic complications  Airway Mallampati: Intubated       Dental  (+) Chipped, Poor Dentition   Pulmonary pneumonia, COPD, former smoker,           Cardiovascular      Neuro/Psych Seizures -,  Anxiety CVA (subdural hematoma) negative psych ROS   GI/Hepatic negative GI ROS, Neg liver ROS,   Endo/Other  negative endocrine ROS  Renal/GU negative Renal ROS  negative genitourinary   Musculoskeletal   Abdominal   Peds  Hematology negative hematology ROS (+)   Anesthesia Other Findings Past Medical History: No date: Anxiety No date: Emphysema of lung (HCC) No date: Foley catheter in place No date: Seizures (HCC) No date: Subdural hematoma (HCC) No date: Urinary retention  History reviewed. No pertinent surgical history.  BMI    Body Mass Index:  9.62 kg/m    Patient went to the ICU post-op after an attempt by GI to place a PEG tube.  The PEG tube placement was aborted due to incision into the stomach.  Patient was saturating in the 60s-70s post-op after that and was intubated and taken to the ICU.  Patient has improved in the ICU and is now saturating in the high 90s to 100.  Going back to the OR for pneumoperitoneum.  Will keep patient intubated post-op and will transport to and from the ICU.  Reproductive/Obstetrics negative OB ROS                             Anesthesia Physical  Anesthesia Plan  ASA: IV  Anesthesia Plan: General   Post-op Pain Management:    Induction:   PONV Risk Score and Plan: Treatment may vary due to age or medical condition  Airway Management Planned: Oral ETT  Additional Equipment:    Intra-op Plan:   Post-operative Plan: Post-operative intubation/ventilation  Informed Consent: I have reviewed the patients History and Physical, chart, labs and discussed the procedure including the risks, benefits and alternatives for the proposed anesthesia with the patient or authorized representative who has indicated his/her understanding and acceptance.   Dental Advisory Given  Plan Discussed with: Anesthesiologist, CRNA and Surgeon  Anesthesia Plan Comments: (Plan to suspend DNR for procedure.  Son voiced understanding.  [place], [place], [phone], consented by phone, for risks of anesthesia including but not limited to:  - adverse reactions to medications - risk of intubation if required - damage to teeth, lips or other oral mucosa - sore throat or hoarseness - Damage to heart, brain, lungs or loss of life  He voiced understanding.)        Anesthesia Quick Evaluation

## 2018-06-22 NOTE — Transfer of Care (Signed)
Immediate Anesthesia Transfer of Care Note  Patient: Alexander Lara  Procedure(s) Performed: EXPLORATORY LAPAROTOMY, gastric repair, gastrostomy tube placement (N/A Abdomen)  Patient Location: PACU and ICU  Anesthesia Type:General  Level of Consciousness: Patient remains intubated per anesthesia plan  Airway & Oxygen Therapy: Patient placed on Ventilator (see vital sign flow sheet for setting)  Post-op Assessment: Report given to RN and Post -op Vital signs reviewed and stable  Post vital signs: Reviewed and stable  Last Vitals:  Vitals Value Taken Time  BP    Temp    Pulse 84 06/22/2018 12:05 AM  Resp 14 06/22/2018 12:10 AM  SpO2 95 % 06/22/2018 12:05 AM  Vitals shown include unvalidated device data.  Last Pain:  Vitals:   07-21-2018 1956  TempSrc: Axillary  PainSc:       Patients Stated Pain Goal: 0 (06/09/18 1753)  Complications: No apparent anesthesia complications

## 2018-06-22 NOTE — Progress Notes (Signed)
Melodie Bouillon, MD [address], [place], [place], [address] [address], [address], [place], [place], [address] Phone: [phone]  Fax: [phone]   Subjective: Patient intubated and sedated at this time.  Patient went to the OR yesterday and is status post gastrorrhaphy and 16 [place] gastrostomy placement   Objective: Exam: Vital signs in last 24 hours: Vitals:   06/22/18 1345 06/22/18 1400 06/22/18 1500 06/22/18 1514  BP: (!) 144/74 (!) 144/74 (!) 152/80   Pulse: 95 98 93   Resp: 11 12 13    Temp:   98.4 F (36.9 C)   TempSrc:      SpO2: 100% 99% 100% 99%  Weight:      Height:       Weight change:   Intake/Output Summary (Last 24 hours) at 06/22/2018 1555 Last data filed at 06/22/2018 0900 Gross per 24 hour  Intake 1979.62 ml  Output 150 ml  Net 1829.62 ml    General: No acute distress, AAO x3 Abd: Soft, NT/ND, No HSM Skin: Warm, no rashes Neck: Supple, Trachea midline   Lab Results: Lab Results  Component Value Date   WBC 15.1 (H) 06/22/2018   HGB 7.4 (L) 06/22/2018   HCT 22.9 (L) 06/22/2018   MCV 80.6 06/22/2018   PLT 261 06/22/2018   Micro Results: Recent Results (from the past 240 hour(s))  MRSA PCR Screening     Status: None   Collection Time: 07/09/2018 11:46 AM  Result Value Ref Range Status   MRSA by PCR NEGATIVE NEGATIVE Final    Comment:        The GeneXpert MRSA Assay (FDA approved for NASAL specimens only), is one component of a comprehensive MRSA colonization surveillance program. It is not intended to diagnose MRSA infection nor to guide or monitor treatment for MRSA infections. Performed at [facility], [address] [place], [place] [address]   Culture, respiratory (non-expectorated)     Status: None (Preliminary result)   Collection Time: 06/28/2018  6:28 PM  Result Value Ref Range Status   Specimen Description   Final    TRACHEAL ASPIRATE Performed at [facility], [address], [place], [place] [address]    Special Requests   Final    NONE Performed at [facility], [address] [place], [place] [address]    Gram Stain   Final    ABUNDANT WBC PRESENT, PREDOMINANTLY PMN ABUNDANT SQUAMOUS EPITHELIAL CELLS PRESENT NO ORGANISMS SEEN    Culture   Final    NO GROWTH < 24 HOURS Performed at [facility], [address] [place], [place] [address]    Report Status PENDING  Incomplete   Studies/Results: Dg Chest 1 View  Result Date: 06/20/2018 CLINICAL DATA:  Intubation.  Postop gastrostomy. EXAM: CHEST  1 VIEW COMPARISON:  07/04/2018 FINDINGS: Endotracheal tube in good position. Left subclavian central venous catheter tip mid SVC. No pneumothorax. Left lower lobe infiltrate unchanged, possible pneumonia. No effusion. COPD Large pneumoperitoneum again noted and unchanged IMPRESSION: Central line SVC. No pneumothorax. Endotracheal tube in good position. Left lower lobe infiltrate Large pneumoperitoneum Electronically Signed   By: Marlan Palau M.D.   On: 07/13/2018 18:24   Dg Chest 1 View  Addendum Date: 07/13/2018   ADDENDUM REPORT: 07/02/2018 16:58 ADDENDUM: These results were called by telephone at the time of interpretation on 06/19/2018 at 4:58 pm to Dr. Lenard Simmer , who verbally acknowledged these results. Electronically Signed   By: Leonette Most  Chestine Spore M.D.   On: 06/30/2018 16:58   Result Date: 06/25/2018 CLINICAL DATA:  Oxygen desaturation.  Endoscopy today EXAM: CHEST  1 VIEW COMPARISON:  05/17/2018 FINDINGS: Left lower lobe airspace disease, possible pneumonia. Minimal right lower lobe atelectasis. Negative for heart failure or edema. Apical scarring bilaterally Left arm PICC tip in the SVC. Large pneumoperitoneum IMPRESSION: 1. Left lower lobe infiltrate, possible aspiration pneumonia 2. Large pneumoperitoneum Electronically Signed: By: Marlan Palau M.D. On: 06/18/2018 16:52   Dg Abd 1 View  Result Date: 06/22/2018 CLINICAL DATA:  Recent  bowel perforation EXAM: ABDOMEN - 1 VIEW COMPARISON:  June 21, 2018 FINDINGS: There is less apparent pneumoperitoneum compared to 1 day prior. Patient has had abdominal surgery with skin staples overlying the mid abdomen. Nasogastric tube tip is in the stomach with the side port at the gastroesophageal junction. Gastrostomy catheter is positioned in the stomach. There are loops of mildly dilated bowel in a pattern suggesting ileus. Bowel obstruction is not felt to be likely. Catheter is noted in the distal sigmoid region. Rectal thermometer in place. There is aortoiliac atherosclerosis. IMPRESSION: Suspect a degree of ileus. Bowel obstruction felt to be less likely. Less pneumoperitoneum evident compared to 1 day prior. Tube positions as described. Nasogastric tube side port is at the gastroesophageal junction. Advise advancing nasogastric tube 4-5 cm. There is aortoiliac atherosclerosis. Aortic Atherosclerosis (ICD10-I70.0). Electronically Signed   By: Bretta Bang III M.D.   On: 06/22/2018 07:26   Dg Abd 1 View  Result Date: 06/16/2018 CLINICAL DATA:  Perforated bowel EXAM: ABDOMEN - 1 VIEW COMPARISON:  07/05/2018 FINDINGS: Large amount of pneumoperitoneum similar to previous study. This is consistent with the known history of perforated bowel. Scattered gas throughout the colon without significant large or small bowel distention. No radiopaque stones. Vascular calcifications. Catheter in the pelvis could represent bladder or rectal catheter. Degenerative changes in the spine. Surgical clips in the left upper quadrant. IMPRESSION: Large amount of pneumoperitoneum similar to previous study. No evidence of bowel obstruction. Electronically Signed   By: Burman Nieves M.D.   On: 07/03/2018 21:22   Dg Abd 1 View  Result Date: 06/15/2018 CLINICAL DATA:  Low oxygen saturation.  Feeding tube placement. EXAM: ABDOMEN - 1 VIEW COMPARISON:  06/17/2018 FINDINGS: Large pneumoperitoneum Metal clips overlying  the stomach. No gastrostomy tube is identified. Foley catheter in the bladder.  No acute bony abnormality. IMPRESSION: Large pneumoperitoneum. Metal clips overlying the stomach presumably related to gastrostomy tube placement. These results were called by telephone at the time of interpretation on 06/25/2018 at 4:56 pm to Dr. Karlton Lemon, who verbally acknowledged these results. Electronically Signed   By: Marlan Palau M.D.   On: 06/30/2018 16:58   Dg Chest Port 1 View  Result Date: 06/22/2018 CLINICAL DATA:  Hypoxia EXAM: PORTABLE CHEST 1 VIEW COMPARISON:  June 21, 2018 FINDINGS: Endotracheal tube tip is 7.7 cm above the carina. Central catheter tip is in the superior cava. Nasogastric tube tip is in the stomach with the side port essentially at the gastroesophageal junction. No evident pneumothorax. Lungs are hyperexpanded. There is airspace consolidation consistent with pneumonia in the left lower lobe. There is patchy atelectatic change in the right mid lung and right base regions. The pneumoperitoneum seen 1 day prior is less apparent currently. Note skin staples in the upper abdomen indicating recent abdominal surgery. IMPRESSION: 1. Tube and catheter positions as described without pneumothorax. Side port of the nasogastric tube is at the gastroesophageal junction. It may be  prudent to consider advancing the nasogastric tube 4-5 cm. 2. Lungs hyperexpanded. Airspace consolidation left lower lobe consistent with pneumonia. Areas of patchy atelectasis right mid lung and right base. 3.  Pneumoperitoneum less well appreciable compared to 1 day prior. 4.  Stable cardiac silhouette. Electronically Signed   By: Bretta Bang III M.D.   On: 06/22/2018 07:24   Dg Kayleen Memos W/water Sol Cm  Result Date: 06/22/2018 CLINICAL DATA:  Gastric perforation following PEG tube placement. Subsequent gastric repair. Evaluate for integrity of repair. EXAM: WATER SOLUBLE UPPER GI SERIES TECHNIQUE: Single-column upper GI series was  performed using water soluble contrast. CONTRAST:  ISOVUE-300 IOPAMIDOL (ISOVUE-300) INJECTION 61% COMPARISON:  None FLUOROSCOPY TIME:  Fluoroscopy Time:  3 minutes 18 seconds Radiation Exposure Index (if provided by the fluoroscopic device): 22.7 mGy Number of Acquired Spot Images: 0 FINDINGS: Real-time fluoroscopy was utilized for evaluation of the stomach. Isovue 300 contrast was hand injected through an existing nasogastric tube opacifying the stomach. Balloon from the PEG tube is delineated within the stomach. Patient was position in the right lateral decubitus position for contrast to flow into the body and antrum of the stomach. No extraluminal contrast is identified to suggest persistent perforation. IMPRESSION: No extraluminal contrast to suggest active gastric perforation. Electronically Signed   By: Elige Ko   On: 06/22/2018 13:10   Medications:  Scheduled Meds: . dextrose      . fentaNYL (SUBLIMAZE) injection  50 mcg Intravenous Once  . insulin aspart  0-9 Units Subcutaneous Q4H  . ipratropium-albuterol  3 mL Nebulization Q6H  . mouth rinse  15 mL Mouth Rinse BID  . sodium chloride flush  10-40 mL Intracatheter Q12H   Continuous Infusions: . sodium chloride 0 mL/hr at 06/20/18 0407  . dextrose 5 % and 0.9 % NaCl with KCl 20 mEq/L 75 mL/hr at 06/22/18 0900  . famotidine (PEPCID) IV Stopped (07/11/2018 2022)  . fentaNYL infusion INTRAVENOUS 30 mcg/hr (06/22/18 0900)  . levETIRAcetam Stopped (06/22/18 1610)  . magnesium sulfate 1 - 4 g bolus IVPB    . piperacillin-tazobactam (ZOSYN)  IV 3.375 g (06/22/18 1540)   PRN Meds:.sodium chloride, acetaminophen, albuterol, fentaNYL, fentaNYL (SUBLIMAZE) injection, metoprolol tartrate, midazolam, [DISCONTINUED] ondansetron **OR** ondansetron (ZOFRAN) IV, polyvinyl alcohol, sodium chloride, sodium chloride flush   Assessment: Active Problems:   Severe protein-calorie malnutrition (HCC)   Recurrent UTI (urinary tract infection)    Adult failure to thrive   Palliative care by specialist   Pressure injury of skin    Plan: Patient intubated and sedated Surgery team following and appreciate their assistance with gastrorrhaphy and gastrostomy placement Patient son involved in decision-making and understands that given patient's frail status, his recovery from this hospitalization is guarded Further care as per primary team and surgery GI service will sign off, please page with any questions or concerns or if we can be of any further assistance   LOS: 13 days   Melodie Bouillon, MD 06/22/2018, 3:55 PM

## 2018-06-22 NOTE — Progress Notes (Signed)
I personally talked to the son by phone and notified about the surgical finding and the surgical procedures. Patient tolerated procedure well and was transferred back to ICU stable.   Also discussed with fiance and sister about patient condition and surgery finding.   I was clear with family that he still has severe problems with his chronic medical and acute infections that has a lot to go through now.   My plan is that if patient continue stable in 24-48 hours, do a Gastrogaffin upper GI study before starting diet.

## 2018-06-22 NOTE — Care Management (Signed)
Patient had EGD and g tube placement 10/8. Experienced hypoxia post procedure.  Patient was DNR but son made decision to intubate.

## 2018-06-22 NOTE — Consult Note (Signed)
Pharmacy Electrolyte Monitoring Consult:  Pharmacy consulted to assist in monitoring and replacing electrolytes in this 72 y.o. male admitted on 06-09-2018 with Urinary Retention   Labs:  Sodium (mmol/L)  Date Value  06/22/2018 137   Potassium (mmol/L)  Date Value  06/22/2018 3.8   Magnesium (mg/dL)  Date Value  21/30/8657 1.8   Phosphorus (mg/dL)  Date Value  84/69/6295 2.9   Calcium (mg/dL)  Date Value  28/41/3244 7.3 (L)   Albumin (g/dL)  Date Value  09/16/7251 2.1 (L)   Corrected Ca = 8.82  Assessment/Plan: Goal Mg > 2.0 due to history of seizures Ordered Mg sulfate 2 g IV x 1. Patient receiving D5W @ 125 mL/hr. Phosphate or potassium replacement not warranted at this time.   Pharmacy will continue to monitor.   Mauri Reading, PharmD Pharmacy Resident  06/22/2018 3:32 PM

## 2018-06-22 NOTE — Progress Notes (Signed)
Pt returned from OR. Placed pt back on vent with previous settings. Pt tol well at this time.

## 2018-06-22 NOTE — Clinical Social Work Note (Signed)
CSW received phone call from patient's significant other, and she stated she has submitted Medicaid application paperwork to [facility] DSS.  Patient is going to be getting peg tube put in today, CSW to continue to follow patient's progress throughout discharge planning.  Ervin Knack. Adalie Mand, MSW, [employer] [phone]

## 2018-06-22 NOTE — Op Note (Signed)
Preoperative diagnosis: Gastric perforation  Postoperative diagnosis: Gastric perforation.   Procedure: Exploratory laparotomy. Gastrorrhaphy, Stamm gastrostomy.  Anesthesia: GETA  Surgeon: Dr. Hazle Quant, MD  Wound Classification: Contaminated  Indications: Patient is a 72 y.o. male who had a PEG attempt and stomach was perforated during the procedure.   Findings: 1. Open anterior iatrogenic gastrotomy 2. Mild bile peritonitis 3. Peri gastric hematoma 4. Gastrostomy 16 Fr in place 5. No transverse colon or small bowel injury identified. 6. No posterior stomach injury 7. Adequate hemostasis  Description of procedure: The patient was placed in the supine position and general endotracheal anesthesia was induced. A time-out was completed verifying correct patient, procedure, site, positioning, and implant(s) and/or special equipment prior to beginning this procedure. The abdomen was prepped and draped in the usual sterile fashion. An upper midline incision was made and deepened through the subcutaneous tissues with electrocautery. Hemostasis was assured. The linea alba was incised and the peritoneal cavity entered.  The stomach was identified and the location of the anterior wall perforation was identified. There was some bile peritonitis that was suctioned. Perigastric hematoma was evacuated. The gastrotomy was repaired with interrupted 3-0 silk sutures. A proximal site on the anterior abdominal wall was selected for the new gastrotomy site. That site was approximated to the chosen exit site and found to reach without tension. A small incision was made and the 16-French Gastrostome was passed through the anterior abdominal wall and into the field.  A purse-string suture of 3-0 silk was placed on the anterior surface of the stomach and a gastrotomy was made with electrocautery in the center of the suture. The catheter was inserted into the lumen of the stomach. The purse-string suture was  secured in place in such a manner as to inkwell the stomach around the catheter. A second, outer concentric pursestring was placed in a similar manner and tied to further inkwell the stomach.  The stomach was then tacked to the anterior abdominal wall at the catheter entrance site with several with the same silk sutures used for the purse-string in such a manner as to prevent leakage or torsion. The catheter was secured to the skin with 2-0 Prolene.  Hemostasis was checked and omentum was brought adjacent to the surgical field. The fascia was closed with a running suture of PDS 0.  The skin was closed with staples.   The patient tolerated the procedure well and was taken to the postanesthesia care unit in stable condition  Specimen: None  Complications: None  EBL: 5 mL

## 2018-06-22 NOTE — Clinical Social Work Note (Addendum)
CSW still following however patient is now on ventilato post procedure today. Will assess needs when off ventilator and PT assesses. [name] MSW,LCSW [phone]

## 2018-06-22 NOTE — Anesthesia Postprocedure Evaluation (Signed)
Anesthesia Post Note  Patient: Alexander Lara  Procedure(s) Performed: EXPLORATORY LAPAROTOMY, gastric repair, gastrostomy tube placement (N/A Abdomen)  Patient location during evaluation: ICU Anesthesia Type: General Level of consciousness: sedated Pain management: pain level controlled Vital Signs Assessment: post-procedure vital signs reviewed and stable Respiratory status: patient remains intubated per anesthesia plan and patient on ventilator - see flowsheet for VS Cardiovascular status: blood pressure returned to baseline and stable Postop Assessment: no apparent nausea or vomiting Anesthetic complications: no     Last Vitals:  Vitals:   06/22/18 0915 06/22/18 0930  BP: (!) 168/92 109/65  Pulse: (!) 114 (!) 102  Resp: 18 16  Temp: 37.1 C 37 C  SpO2: 98% 100%    Last Pain:  Vitals:   06/24/2018 1956  TempSrc: Axillary  PainSc:                  Lenard Simmer

## 2018-06-22 NOTE — Consult Note (Signed)
Pharmacy Antibiotic Note  Alexander Lara is a 72 y.o. male admitted on 05/20/2018 with pneumonia.  Pharmacy has been consulted for Zosyn dosing. Admitted with on 9/25 for UTI and PNA . Patient underwent EGD on 10/8. Patient had hypoxia after procedure; O2 sats in the 60s and was transferred to ICU.[name] Abdominal x-ray shows large pneumoperitoneum - exploratory laparotomy 10/9. Potential upper GI series tomorrow for gastric repair.  Plan: Continue Zosyn 3.375g IV q8h (4 hour infusion) for today per ICU rounds discussion.  Height: 5\' 11"  (180.3 cm) Weight: 78 lb 7.7 oz (35.6 kg) IBW/kg (Calculated) : 75.3  Temp (24hrs), Avg:98.1 F (36.7 C), Min:95.9 F (35.5 C), Max:99 F (37.2 C)  Recent Labs  Lab 06/18/18 0426 06/19/18 1859 06/20/18 0453 01-Jul-2018 0426 01-Jul-2018 2002 07-01-18 2049 06/22/18 0122 06/22/18 0442  WBC 8.2  --  11.9*  --  11.5*  --   --  15.1*  CREATININE 0.63 0.58*  --  0.60* 0.51*  --   --  0.63  LATICACIDVEN  --   --   --   --   --  1.9 1.0  --     Estimated Creatinine Clearance: 41.4 mL/min (by C-G formula based on SCr of 0.63 mg/dL).    Allergies  Allergen Reactions  . Penicillins     Antimicrobials this admission: 10/9 Zosyn >> 10/7 Augmentin >> 10/8 9/30 Unasyn >>10/8 9/28 Flagyl >> 09/30 9/26 Ceftriaxone >> 9/30   Microbiology results: 9/25 BCx: Proteus - pansensitive  9/25 UCx: Proteus mirabilis resistant to Macrobid  10/8 Sputum: abundant WBCs  10/8 MRSA PCR: (-)  Thank you for allowing pharmacy to be a part of this patient's care.   Mauri Reading, PharmD Pharmacy Resident  06/22/2018 3:17 PM

## 2018-06-22 NOTE — Progress Notes (Signed)
SURGICAL PROGRESS NOTE   Hospital Day(s): 13.   Post op day(s): 1 Day Post-Op.   Interval History: Patient seen and examined, no acute events or new complaints overnight. Interval history taken from nurse and ICU physician. Refers patient has been going great. No issues after surgery.   Vital signs in last 24 hours: [min-max] current  Temp:  [95.9 F (35.5 C)-99 F (37.2 C)] 98.6 F (37 C) (10/09 0930) Pulse Rate:  [66-115] 102 (10/09 0930) Resp:  [7-26] 16 (10/09 0930) BP: (99-180)/(60-99) 109/65 (10/09 0930) SpO2:  [63 %-100 %] 100 % (10/09 0930) FiO2 (%):  [40 %-100 %] 40 % (10/09 0815) Weight:  [32.7 kg-35.6 kg] 35.6 kg (10/09 0430)     Height: 5\' 11"  (180.3 cm) Weight: 35.6 kg BMI (Calculated): 10.95    Physical Exam:  Constitutional: sedated on mechanical ventilation. No distress. React to light touch.  Abdomen: soft, depressible, tympanic, wounds dry and clean. Gastrostomy in place.   Labs:  CBC Latest Ref Rng & Units 06/22/2018 07/12/2018 06/20/2018  WBC 4.0 - 10.5 K/uL 15.1(H) 11.5(H) 11.9(H)  Hemoglobin 13.0 - 17.0 g/dL 7.4(L) 7.9(L) 8.4(L)  Hematocrit 39.0 - 52.0 % 22.9(L) 25.0(L) 25.7(L)  Platelets 150 - 400 K/uL 261 258 225   CMP Latest Ref Rng & Units 06/22/2018 07/01/2018 06/27/2018  Glucose 70 - 99 mg/dL 960(A) 540(J) 93  BUN 8 - 23 mg/dL 14 14 15   Creatinine 0.61 - 1.24 mg/dL 8.11 9.14(N) 8.29(F)  Sodium 135 - 145 mmol/L 137 133(L) 137  Potassium 3.5 - 5.1 mmol/L 3.8 3.9 3.8  Chloride 98 - 111 mmol/L 112(H) 106 110  CO2 22 - 32 mmol/L 20(L) 19(L) 22  Calcium 8.9 - 10.3 mg/dL 7.3(L) 7.5(L) 7.5(L)  Total Protein 6.5 - 8.1 g/dL - - -  Total Bilirubin 0.3 - 1.2 mg/dL - - -  Alkaline Phos 38 - 126 U/L - - -  AST 15 - 41 U/L - - -  ALT 0 - 44 U/L - - -    Imaging studies: No new pertinent imaging studies   Assessment/Plan:  72 y.o. male with gastric laceration 1 Day Post-Op s/p gastrorrhaphy and gastrostomy. Patient with no clinical deterioration. Case  discussed with Dr. Lonn Georgia and nurse. Agree with them that if patient is ready for extubation, there is no contraindication from surgery standpoint. Discussed with them to do upper GI series tomorrow for evaluation of gastric repair integrity.   Gae Gallop, MD

## 2018-06-22 NOTE — Progress Notes (Signed)
Nutrition Follow-up  DOCUMENTATION CODES:   Severe malnutrition in context of chronic illness  INTERVENTION:  Initiate Vital 1.5 at 10 mL/hr per G-tube.  Patient was previously tolerating his goal TF regimen via an NGT prior to placement of G-tube.  In the morning if patient is tolerating trickle rate, recommend advancing to goal regimen of Vital 1.5 at 35 mL/hr (840 mL goal daily volume) per G-tube. Provides 1260 kcal (94% estimated needs), 57 grams of protein, 638 mL H2O daily.  Recommend free water flush of 60 mL Q4hrs once patient is advanced to goal TF regimen. Provides a total of 998 mL H2O daily including water in goal TF regimen.  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  Ongoing.  GOAL:   Patient will meet greater than or equal to 90% of their needs  Progressing.  MONITOR:   Labs, Weight trends, TF tolerance, Skin, I & O's  REASON FOR ASSESSMENT:   Malnutrition Screening Tool, Ventilator    ASSESSMENT:   Pt is resident of [facility] admitted 9/25 for urinary retention w/ foley catheter in place and failure to thrive. Previous admit 8/31PMH: emphysema, seizures, subdural heamtoma   -Patient previously had NGT and has been at goal TF rate since 10/7 and tolerating well. -Placement of PEG tube was attempted 10/8 but was unsuccessful. Due to patient's cachexia the blade was seen in the stomach as soon as the incision was made. Two clips were placed. -Patient had hypoxia following attempt at PEG tube placement and required intubation. -Surgery was consulted as an x-ray taken after procedure showed a large amount of pneumoperitoneum. -Patient underwent exploratory laparotomy, gastrorrhaphy, and placement of 16 Fr. Gastrostomy tube on 10/9 shortly after midnight. -Underwent upper GI series today. No extraluminal contrast to suggest active gastric perforation.  Patient is  now intubated and sedated. On PRVC mode with FiO2 40% and PEEP 5 cmH2O.   Enteral Access: 16 Fr. G-tube placed surgically on 10/9; patient also has NGT in place but plan is to remove it  MAP: 74-115 mmHg  Patient is currently intubated on ventilator support Ve: 8.4 L/min Temp (24hrs), Avg:98.1 F (36.7 C), Min:95.9 F (35.5 C), Max:99 F (37.2 C)  Propofol: N/A  Medications reviewed and include: Novolog 0-9 units Q4hrs, D5W at 125 mL/hr (150 grams dextrose, 510 kcal daily), famotidine, fentanyl gtt, Keppra, magnesium sulfate 2 grams IV once today, Zosyn.  Labs reviewed: CBG 53-152, Chloride 112, CO2 20.  I/O: 150 mL UOP yesterday; 1 BM yesterday  Weight trend: 35.6 kg on 10/9; +6.7 kg from admission wt  Discussed with RN and on rounds.  After reviewing results from upper GI series called Dr. Hazle Quant. Okay to initiate trickle tube feeds per G-tube per surgery. RN received confirmation from Dr. Lonn Georgia okay to start trickle tube feeds.  Diet Order:   Diet Order            Diet NPO time specified  Diet effective now              EDUCATION NEEDS:   No education needs have been identified at this time  Skin:  Skin Assessment: Skin Integrity Issues: Skin Integrity Issues:: Stage I, Incisions Stage I: mid sacrum Incisions: closed incision to abdomen  Last BM:  2018-07-03 - large type 7  Height:   Ht Readings from Last 1 Encounters:  07-03-2018 5\' 11"  (1.803 m)    Weight:   Wt Readings  from Last 1 Encounters:  06/22/18 35.6 kg    Ideal Body Weight:  78.2 kg  BMI:  Body mass index is 10.95 kg/m.  Estimated Nutritional Needs:   Kcal:  1344 (PSU 2003b w/ MSJ 1128, Ve 8.4, Tmax 37.2)  Protein:  54-60 grams  Fluid:  1 L/day  Helane Rima, MS, RD, LDN Office: [phone] Pager: [phone] After Hours/Weekend Pager: [phone]

## 2018-06-22 NOTE — Anesthesia Post-op Follow-up Note (Signed)
Anesthesia QCDR form completed.        

## 2018-06-22 NOTE — Progress Notes (Signed)
Patient ID: Alexander Lara, male   DOB: Sep 27, 1945, 72 y.o.   MRN: [id number]   [employer] PROGRESS NOTE  Alexander Lara UXL:[id number] DOB: 1946-06-17 DOA: 06/03/2018 [facility]: [facility]  HPI/Subjective: Events of yesterday noted now intubated  Objective: Vitals:   06/22/18 0930 06/22/18 1200  BP: 109/65   Pulse: (!) 102   Resp: 16   Temp: 98.6 F (37 C)   SpO2: 100% 100%    Filed Weights   07/06/2018 1100 07/09/2018 1747 06/22/18 0430  Weight: 32.7 kg 35.6 kg 35.6 kg    ROS: Review of Systems  Unable to perform ROS: Acuity of condition   Exam: Physical Exam  Constitutional: He appears cachectic.  HENT:  Nose: No mucosal edema.  Mouth/Throat: No oropharyngeal exudate or posterior oropharyngeal edema.  Eyes: Pupils are equal, round, and reactive to light. Conjunctivae and lids are normal.  Neck: No JVD present. Carotid bruit is not present. No edema present. No thyroid mass and no thyromegaly present.  Cardiovascular: S1 normal and S2 normal. An irregularly irregular rhythm present. Exam reveals no gallop.  No murmur heard. Pulses:      Dorsalis pedis pulses are 2+ on the right side, and 2+ on the left side.  Respiratory: No respiratory distress. He has no wheezes. He has no rhonchi. He has no rales.  GI: Soft. Bowel sounds are normal. There is no tenderness.  Musculoskeletal:       Right ankle: He exhibits no swelling.       Left ankle: He exhibits no swelling.  Lymphadenopathy:    He has no cervical adenopathy.  Neurological: He is unresponsive.  Skin: Skin is warm. No rash noted. Nails show no clubbing.  Psychiatric:  Alert but not answering questions      Data Reviewed: Basic Metabolic Panel: Recent Labs  Lab 06/18/18 0426  06/19/18 0514 06/19/18 1859 06/20/18 0453 07/13/2018 0426 07/08/2018 2002 06/22/18 0442  NA 146*  --   --  137  --  137 133* 137  K 2.8*   < > 3.7 3.6 3.6 3.8 3.9 3.8  CL 118*  --   --  111  --  110 106 112*  CO2 22  --    --  21*  --  22 19* 20*  GLUCOSE 117*  --   --  162*  --  93 164* 169*  BUN 13  --   --  15  --  15 14 14   CREATININE 0.63  --   --  0.58*  --  0.60* 0.51* 0.63  CALCIUM 7.7*  --   --  7.2*  --  7.5* 7.5* 7.3*  MG 1.8  --  1.7  --  1.8 2.0  --  1.8  PHOS 1.7*  --  1.8*  --  2.5 2.5  --  2.9   < > = values in this interval not displayed.   CBC: Recent Labs  Lab 06/18/18 0426 06/20/18 0453 07/06/2018 2002 06/22/18 0442  WBC 8.2 11.9* 11.5* 15.1*  HGB 8.8* 8.4* 7.9* 7.4*  HCT 26.7* 25.7* 25.0* 22.9*  MCV 78.1* 79.2* 80.9 80.6  PLT 256 225 258 261   Cardiac Enzymes:   Recent Results (from the past 240 hour(s))  MRSA PCR Screening     Status: None   Collection Time: 06/22/2018 11:46 AM  Result Value Ref Range Status   MRSA by PCR NEGATIVE NEGATIVE Final    Comment:  The GeneXpert MRSA Assay (FDA approved for NASAL specimens only), is one component of a comprehensive MRSA colonization surveillance program. It is not intended to diagnose MRSA infection nor to guide or monitor treatment for MRSA infections. Performed at [facility], [address] [place], [place] [address]   Culture, respiratory (non-expectorated)     Status: None (Preliminary result)   Collection Time: 07/05/2018  6:28 PM  Result Value Ref Range Status   Specimen Description   Final    TRACHEAL ASPIRATE Performed at [facility], [address] [place], [place] [address]    Special Requests   Final    NONE Performed at [facility], [address] [place], [place] [address]    Gram Stain   Final    ABUNDANT WBC PRESENT, PREDOMINANTLY PMN ABUNDANT SQUAMOUS EPITHELIAL CELLS PRESENT NO ORGANISMS SEEN    Culture   Final    NO GROWTH < 24 HOURS Performed at [facility], [address], [place], [place] [address]    Report Status PENDING  Incomplete     Studies: Dg Chest 1 View  Result Date: 06/28/2018 CLINICAL DATA:  Intubation.  Postop gastrostomy.  EXAM: CHEST  1 VIEW COMPARISON:  07/04/2018 FINDINGS: Endotracheal tube in good position. Left subclavian central venous catheter tip mid SVC. No pneumothorax. Left lower lobe infiltrate unchanged, possible pneumonia. No effusion. COPD Large pneumoperitoneum again noted and unchanged IMPRESSION: Central line SVC. No pneumothorax. Endotracheal tube in good position. Left lower lobe infiltrate Large pneumoperitoneum Electronically Signed   By: Marlan Palau M.D.   On: 07/06/2018 18:24   Dg Chest 1 View  Addendum Date: 06/27/2018   ADDENDUM REPORT: 06/19/2018 16:58 ADDENDUM: These results were called by telephone at the time of interpretation on 07/13/2018 at 4:58 pm to Dr. Lenard Simmer , who verbally acknowledged these results. Electronically Signed   By: Marlan Palau M.D.   On: 07/03/2018 16:58   Result Date: 06/27/2018 CLINICAL DATA:  Oxygen desaturation.  Endoscopy today EXAM: CHEST  1 VIEW COMPARISON:  06-12-18 FINDINGS: Left lower lobe airspace disease, possible pneumonia. Minimal right lower lobe atelectasis. Negative for heart failure or edema. Apical scarring bilaterally Left arm PICC tip in the SVC. Large pneumoperitoneum IMPRESSION: 1. Left lower lobe infiltrate, possible aspiration pneumonia 2. Large pneumoperitoneum Electronically Signed: By: Marlan Palau M.D. On: 06/19/2018 16:52   Dg Abd 1 View  Result Date: 06/22/2018 CLINICAL DATA:  Recent bowel perforation EXAM: ABDOMEN - 1 VIEW COMPARISON:  June 21, 2018 FINDINGS: There is less apparent pneumoperitoneum compared to 1 day prior. Patient has had abdominal surgery with skin staples overlying the mid abdomen. Nasogastric tube tip is in the stomach with the side port at the gastroesophageal junction. Gastrostomy catheter is positioned in the stomach. There are loops of mildly dilated bowel in a pattern suggesting ileus. Bowel obstruction is not felt to be likely. Catheter is noted in the distal sigmoid region. Rectal thermometer in  place. There is aortoiliac atherosclerosis. IMPRESSION: Suspect a degree of ileus. Bowel obstruction felt to be less likely. Less pneumoperitoneum evident compared to 1 day prior. Tube positions as described. Nasogastric tube side port is at the gastroesophageal junction. Advise advancing nasogastric tube 4-5 cm. There is aortoiliac atherosclerosis. Aortic Atherosclerosis (ICD10-I70.0). Electronically Signed   By: Bretta Bang III M.D.   On: 06/22/2018 07:26   Dg Abd 1 View  Result Date: 06/16/2018 CLINICAL DATA:  Perforated bowel EXAM: ABDOMEN - 1 VIEW COMPARISON:  06/28/2018  FINDINGS: Large amount of pneumoperitoneum similar to previous study. This is consistent with the known history of perforated bowel. Scattered gas throughout the colon without significant large or small bowel distention. No radiopaque stones. Vascular calcifications. Catheter in the pelvis could represent bladder or rectal catheter. Degenerative changes in the spine. Surgical clips in the left upper quadrant. IMPRESSION: Large amount of pneumoperitoneum similar to previous study. No evidence of bowel obstruction. Electronically Signed   By: William  Stevens M.D.   On: 05-Jan-2018 21:22   Dg Abd 1 View  Result Date: 06/30/2018 CLINICAL DATA:  Low oxygen saturation.  Feeding tube placement. EXAM: ABDOMEN - 1 VIEW COMPARISON:  06/17/2018 FINDINGS: Large pneumoperitoneum Metal clips overlying the stomach. No gastrostomy tube is identified. Foley catheter in the bladder.  No acute bony abnormality. IMPRESSION: Large pneumoperitoneum. Metal clips overlying the stomach presumably related to gastrostomy tube placement. These results were called by telephone at the time of interpretation on 07/06/2018 at 4:56 pm to Dr. KARENZ, who verbally acknowledged these results. Electronically Signed   By: Charles  Clark M.D.   On: 05-Jan-2018 16:58   Dg Chest Port 1 View  Result Date: 06/22/2018 CLINICAL DATA:  Hypoxia EXAM: PORTABLE CHEST 1 VIEW  COMPARISON:  June 21, 2018 FINDINGS: Endotracheal tube tip is 7.7 cm above the carina. Central catheter tip is in the superior cava. Nasogastric tube tip is in the stomach with the side port essentially at the gastroesophageal junction. No evident pneumothorax. Lungs are hyperexpanded. There is airspace consolidation consistent with pneumonia in the left lower lobe. There is patchy atelectatic change in the right mid lung and right base regions. The pneumoperitoneum seen 1 day prior is less apparent currently. Note skin staples in the upper abdomen indicating recent abdominal surgery. IMPRESSION: 1. Tube and catheter positions as described without pneumothorax. Side port of the nasogastric tube is at the gastroesophageal junction. It may be prudent to consider advancing the nasogastric tube 4-5 cm. 2. Lungs hyperexpanded. Airspace consolidation left lower lobe consistent with pneumonia. Areas of patchy atelectasis right mid lung and right base. 3.  Pneumoperitoneum less well appreciable compared to 1 day prior. 4.  Stable cardiac silhouette. Electronically Signed   By: William  Woodruff III M.D.   On: 06/22/2018 07:24   Dg Ugi W/water Sol Cm  Result Date: 06/22/2018 CLINICAL DATA:  Gastric perforation following PEG tube placement. Subsequent gastric repair. Evaluate for integrity of repair. EXAM: WATER SOLUBLE UPPER GI SERIES TECHNIQUE: Single-column upper GI series was performed using water soluble contrast. CONTRAST:  <MEASUREMENTMarland [id number] [id number] CarHassa238 Win nMarland KitchenK46614Phill [id number] Springs ShoresHassa8730 B Marland KitchenK46614Phill [id number] G nMarland KitchenK46614Phill [id number] SMarland KitchenK46614Phill [id number] nMarland KitchenK46614Phill Mu16109eCuyamHassa Marland KitchenK46614Phill Mu16109eGreenHass 8Marland KitchenK46614Phill [id number] S. rMarland KitchenK46614Phill Mu16109eLa CHassa112 N. Wo lMarland KitchenK46614Phill Mu16109eNew GeHassa69 C rMarland KitchenK46614Phill [id number] Ch tMarland KitchenK46614Phill [id number] NE. iMarland KitchenK46614Phill [id number] Ea Marland KitchenK46614Phill [id number] N. Unive iMarland KitchenK46614Phill Mu16109eLake BHassa112 N. Wo<MEASUREMEN lMarland KitchenK46614Phill Mu16109eGreencHassa73 C fMarland KitchenK46614Phill [id number] W dMarland KitchenK46614Phill [id number] Gr nMarland KitchenK46614Phill Mu16109eSea Hassa[address]HaberrPAMIDOL (ISOVUE-300) INJECTION 61% COMPARISON:  None FLUOROSCOPY TIME:  Fluoroscopy Time:  3 minutes 18 seconds Radiation Exposure Index (if provided by the fluoroscopic device): 22.7 mGy Number of Acquired Spot Images: 0 FINDINGS: Real-time fluoroscopy was utilized for evaluation of the stomach. Isovue 300 contrast was hand injected through an existing nasogastric tube opacifying the stomach. Balloon from the PEG tube is delineated within the stomach. Patient was position in  the right lateral decubitus position for contrast to flow into the body and antrum of the stomach. No extraluminal contrast is identified to suggest persistent perforation. IMPRESSION: No extraluminal contrast to suggest active gastric perforation. Electronically Signed   By: Hetal  Deonna Krummel   On: 06/22/2018 13:10    Scheduled Meds: . fentaNYL (SUBLIMAZE) injection  50 mcg Intravenous Once  . insulin aspart  0-9 Units Subcutaneous Q4H  . ipratropium-albuterol  3 mL Nebulization Q6H  . mouth rinse  15 mL Mouth Rinse BID  .  sodium chloride flush  10-40 mL Intracatheter Q12H   Continuous Infusions: . sodium chloride 0 mL/hr at 06/20/18 0407  . dextrose 5 % and 0.9 % NaCl with KCl 20 mEq/L 75 mL/hr at 06/22/18 0900  . famotidine (PEPCID) IV Stopped (03-Jul-2018 2022)  . fentaNYL infusion INTRAVENOUS 30 mcg/hr (06/22/18 0900)  . levETIRAcetam Stopped (06/22/18 4098)  . piperacillin-tazobactam (ZOSYN)  IV Stopped (06/22/18 0849)    Assessment/Plan:   1. Acute respiratory failure post surgery to new ventilator likely extubation per pulmonary 2. Status post gastric laceration status post gastrorrhaphy and gastrostomy.  The recommendations per surgery 3. Proteus sepsis from indwelling Foley catheter.  Blood cultures and urine cultures positive.  Foley catheter changed.  This will have to be changed every 4 weeks.  Continue Unasyn  4. Clostridium species also and blood culture.  Continue IV Unasyn which would cover Clostridium species also.  Family decided against colonoscopy at this point. 5. Failed swallow evaluation.  Status post surgery for gastric tube 6. 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  CT scan not showing any signs of malignancy at this point.  Unasyn for chronic aspiration 7. Atrial fibrillation with rapid ventricular response.  Metoprolol changed to IV as needed 8. History of seizures on Keppra 9. Failure to thrive, cachexia and malnutrition.   Overall prognosis is poor.  Patient is a DNR.  Now his condition is even worsened post procedure 10. BPH.  meds on hold   Code Status:     Code Status Orders  (From admission, onward)         Start     Ordered   06/10/18 1120  Do not attempt resuscitation (DNR)  Continuous    Question Answer Comment  In the event of cardiac or respiratory ARREST Do not call a "code blue"   In the event of cardiac or respiratory ARREST Do not perform Intubation, CPR, defibrillation or ACLS   In the event of cardiac or respiratory ARREST Use medication by any route, position, wound care, and other measures to relive pain and suffering. May use oxygen, suction and manual treatment of airway obstruction as needed for comfort.      06/10/18 1121        Code Status History    Date Active Date Inactive Code Status Order ID Comments User Context   06/09/2018 0209 06/10/2018 1121 DNR [id number]  Barbaraann Rondo, MD ED   05/15/2018 1120 05/18/2018 2303 Full Code [id number]  Auburn Bilberry, MD Inpatient   05/15/2018 0125 05/15/2018 1120 Full Code [id number]  Cammy Copa, MD Inpatient    Advance Directive Documentation     Most Recent Value  Type of Advance Directive  Healthcare Power of Attorney  Pre-existing out of facility DNR order (yellow form or pink MOST form)  -  "MOST" Form in Place?  -     Disposition Plan: Will not be able to get PEG tube until next week  Antibiotics:  Unasyn  Time spent: 25 minutes.  Spoke with fianc on the phone today.  Sister did not pick up the phone when I called her.  Jacarri Gesner [employer]

## 2018-06-22 NOTE — Anesthesia Postprocedure Evaluation (Signed)
Anesthesia Post Note  Patient: Alexander Lara  Procedure(s) Performed: PERCUTANEOUS ENDOSCOPIC GASTROSTOMY (PEG) PLACEMENT (N/A )  Patient location during evaluation: ICU Anesthesia Type: General Level of consciousness: sedated Pain management: pain level controlled Vital Signs Assessment: post-procedure vital signs reviewed and stable Respiratory status: spontaneous breathing, nonlabored ventilation and respiratory function stable Cardiovascular status: blood pressure returned to baseline and stable Postop Assessment: no apparent nausea or vomiting Anesthetic complications: no     Last Vitals:  Vitals:   06/22/18 0545 06/22/18 0600  BP: 109/71 115/69  Pulse: 88 90  Resp: 12 (!) 7  Temp: 37.2 C 37.2 C  SpO2: 99% 98%    Last Pain:  Vitals:   06/14/2018 1956  TempSrc: Axillary  PainSc:                  [name]

## 2018-06-23 ENCOUNTER — Inpatient Hospital Stay: Payer: Self-pay

## 2018-06-23 ENCOUNTER — Inpatient Hospital Stay: Payer: Medicare Other

## 2018-06-23 DIAGNOSIS — Z9911 Dependence on respirator [ventilator] status: Secondary | ICD-10-CM

## 2018-06-23 DIAGNOSIS — Z7189 Other specified counseling: Secondary | ICD-10-CM

## 2018-06-23 LAB — GLUCOSE, CAPILLARY
GLUCOSE-CAPILLARY: 139 mg/dL — AB (ref 70–99)
GLUCOSE-CAPILLARY: 139 mg/dL — AB (ref 70–99)
GLUCOSE-CAPILLARY: 84 mg/dL (ref 70–99)
GLUCOSE-CAPILLARY: 89 mg/dL (ref 70–99)
Glucose-Capillary: 154 mg/dL — ABNORMAL HIGH (ref 70–99)
Glucose-Capillary: 154 mg/dL — ABNORMAL HIGH (ref 70–99)

## 2018-06-23 LAB — CBC WITH DIFFERENTIAL/PLATELET
Abs Immature Granulocytes: 0.03 10*3/uL (ref 0.00–0.07)
BASOS PCT: 0 %
Basophils Absolute: 0 10*3/uL (ref 0.0–0.1)
EOS ABS: 0 10*3/uL (ref 0.0–0.5)
EOS PCT: 0 %
HCT: 17.5 % — ABNORMAL LOW (ref 39.0–52.0)
Hemoglobin: 5.6 g/dL — ABNORMAL LOW (ref 13.0–17.0)
Immature Granulocytes: 0 %
Lymphocytes Relative: 9 %
Lymphs Abs: 0.7 10*3/uL (ref 0.7–4.0)
MCH: 26 pg (ref 26.0–34.0)
MCHC: 32 g/dL (ref 30.0–36.0)
MCV: 81.4 fL (ref 80.0–100.0)
Monocytes Absolute: 0.3 10*3/uL (ref 0.1–1.0)
Monocytes Relative: 4 %
NRBC: 0 % (ref 0.0–0.2)
Neutro Abs: 6.8 10*3/uL (ref 1.7–7.7)
Neutrophils Relative %: 87 %
PLATELETS: 216 10*3/uL (ref 150–400)
RBC: 2.15 MIL/uL — AB (ref 4.22–5.81)
RDW: 23.8 % — AB (ref 11.5–15.5)
WBC: 7.9 10*3/uL (ref 4.0–10.5)

## 2018-06-23 LAB — BASIC METABOLIC PANEL
Anion gap: 6 (ref 5–15)
BUN: 11 mg/dL (ref 8–23)
CHLORIDE: 111 mmol/L (ref 98–111)
CO2: 18 mmol/L — ABNORMAL LOW (ref 22–32)
CREATININE: 0.63 mg/dL (ref 0.61–1.24)
Calcium: 7.3 mg/dL — ABNORMAL LOW (ref 8.9–10.3)
GFR calc Af Amer: >60 mL/min (ref >60)
Glucose, Bld: 161 mg/dL — ABNORMAL HIGH (ref 70–99)
Potassium: 3 mmol/L — ABNORMAL LOW (ref 3.5–5.1)
Sodium: 135 mmol/L (ref 135–145)

## 2018-06-23 LAB — POTASSIUM: Potassium: 4.3 mmol/L (ref 3.5–5.1)

## 2018-06-23 LAB — ALBUMIN: Albumin: 1.6 g/dL — ABNORMAL LOW (ref 3.5–5.0)

## 2018-06-23 LAB — MAGNESIUM: Magnesium: 2.1 mg/dL (ref 1.7–2.4)

## 2018-06-23 LAB — PREPARE RBC (CROSSMATCH)

## 2018-06-23 LAB — HEMOGLOBIN AND HEMATOCRIT, BLOOD
HEMATOCRIT: 29.3 % — AB (ref 39.0–52.0)
HEMOGLOBIN: 9.9 g/dL — AB (ref 13.0–17.0)

## 2018-06-23 LAB — PROCALCITONIN: Procalcitonin: 0.97 ng/mL

## 2018-06-23 MED ORDER — KCL-LACTATED RINGERS-D5W 20 MEQ/L IV SOLN
INTRAVENOUS | Status: DC
  Administered 2018-06-23: 06:00:00 via INTRAVENOUS
  Filled 2018-06-23 (×3): qty 1000

## 2018-06-23 MED ORDER — KCL IN DEXTROSE-NACL 20-5-0.9 MEQ/L-%-% IV SOLN
INTRAVENOUS | Status: DC
  Administered 2018-06-23 – 2018-06-25 (×7): via INTRAVENOUS
  Filled 2018-06-23 (×12): qty 1000

## 2018-06-23 MED ORDER — SODIUM CHLORIDE 0.9% IV SOLUTION: Freq: Once | INTRAVENOUS | Status: DC

## 2018-06-23 MED ORDER — VITAL 1.5 CAL PO LIQD
1000.0000 mL | ORAL | Status: DC
  Administered 2018-06-23: 1000 mL

## 2018-06-23 MED ORDER — POTASSIUM CHLORIDE 10 MEQ/100ML IV SOLN
10.0000 meq | INTRAVENOUS | Status: AC
  Administered 2018-06-23 (×4): 10 meq via INTRAVENOUS
  Filled 2018-06-23 (×4): qty 100

## 2018-06-23 MED ORDER — DEXAMETHASONE SODIUM PHOSPHATE 10 MG/ML IJ SOLN: 10.0000 mg | Freq: Once | INTRAMUSCULAR | Status: DC

## 2018-06-23 MED ORDER — FAMOTIDINE IN NACL 20-0.9 MG/50ML-% IV SOLN
20.0000 mg | Freq: Two times a day (BID) | INTRAVENOUS | Status: DC
  Administered 2018-06-23 – 2018-06-27 (×8): 20 mg via INTRAVENOUS
  Filled 2018-06-23 (×8): qty 50

## 2018-06-23 MED ORDER — DEXAMETHASONE SODIUM PHOSPHATE 4 MG/ML IJ SOLN
4.0000 mg | Freq: Four times a day (QID) | INTRAMUSCULAR | Status: DC
  Administered 2018-06-23 – 2018-06-24 (×3): 4 mg via INTRAVENOUS
  Filled 2018-06-23 (×3): qty 1

## 2018-06-23 MED ORDER — FAMOTIDINE 20 MG PO TABS: 20.0000 mg | ORAL_TABLET | Freq: Every day | ORAL | Status: DC

## 2018-06-23 MED ORDER — DIPHENHYDRAMINE HCL 50 MG/ML IJ SOLN
25.0000 mg | Freq: Once | INTRAMUSCULAR | Status: AC
  Administered 2018-06-23: 25 mg via INTRAVENOUS
  Filled 2018-06-23: qty 1

## 2018-06-23 MED ORDER — DEXAMETHASONE SODIUM PHOSPHATE 4 MG/ML IJ SOLN: 4.0000 mg | Freq: Four times a day (QID) | INTRAMUSCULAR | Status: DC

## 2018-06-23 MED ORDER — FREE WATER
60.0000 mL | Status: DC
  Administered 2018-06-23 – 2018-06-26 (×18): 60 mL

## 2018-06-23 NOTE — Progress Notes (Signed)
   06/23/18 1515  Clinical Encounter Type  Visited With Patient and family together  Visit Type Initial;Spiritual support;Critical Care  Referral From Nurse  Consult/Referral To Chaplain  Spiritual Encounters  Spiritual Needs Prayer;Emotional;Grief support;Other (Comment)   CH received a page to report to the patient's room to comfort family. Alexander Lara (per his sister) has been bed ridden for several months after a fall at home that cause swelling that affect the patient's ability to walk, eat and at times to speak. I provided active listening and a pastoral presences. I will inform the on-coming chaplain of the patient's status.

## 2018-06-23 NOTE — Progress Notes (Signed)
Precedex turned off for vent wean. Spontaneous breathing trial in progress.

## 2018-06-23 NOTE — Care Management (Addendum)
RNCM reached out to patient's son Alexander Lara to discuss potential LTAC option. He asked RNCM to call him back in 15 minutes. Patient does not have Medicare part B- [facility] cannot accept.  [facility] will consider with [employer] will assist with payment. Message left for [facility] at [facility] ext. [phone]. Son Alexander Lara is next of kin and emergency contact for patient per Steward Drone at [facility]. She is going to work on that- she knows they have a Copy in [place]. I spoke with Alexander Lara and he said that he will plans to leave [place] and head this way on Sunday. He does not wish for patient to be moved to [place] or LTAC at this time. He is considering comfort measures.

## 2018-06-23 NOTE — Progress Notes (Signed)
Pt extubated without complications, placed on 5lpm [place], sats 100%, respiratory rate 14/min, no stridor noted, tolerating well at this time. Will continue to monitor.

## 2018-06-23 NOTE — Progress Notes (Signed)
Follow up - Critical Care Medicine Note  Patient Details:    Alexander Lara is an 72 y.o. male. admitted with Proteus UTI,Bacteremia, and sepsis from chronic indwelling foley. Pt has a history of Chronic subdural hematoma, chronic aspiration, and severe malnutrition.On 10/8 heunderwent EGD for PEG placement, of which attempt was aborted due to blade entering the stomach. Post procedure pt developedsevere Acute Hypoxic Respiratory Failure necessitating intubation. Follow up CXR with LLL infiltrate concerning for aspiration. Follow up Abdominal X-ray with large acute Pneumoperitoneum concerning for possible Abdominal perforation. Surgery consulted, taken to the operating room for exploratory laparotomy and repair.  Patient is presently intubated in the intensive care unit  Lines, Airways, Drains: Airway 7.5 mm (Active)  Secured at (cm) 23 cm 06/23/2018  4:01 AM  Measured From Lips 06/23/2018  4:01 AM  Secured Location Center 06/23/2018  4:01 AM  Secured By [name] 06/23/2018  4:01 AM  Tube Holder Repositioned Yes 06/23/2018  4:01 AM  Cuff Pressure (cm H2O) 26 cm H2O 06/23/2018  4:01 AM  Site Condition Dry 06/23/2018  4:01 AM     PICC Single Lumen 06/13/18 PICC Left Brachial 43 cm 0 cm (Active)  Indication for Insertion or Continuance of Line Prolonged intravenous therapies 06/22/2018  8:00 PM  Exposed Catheter (cm) 0 cm 06/13/2018  5:00 PM  Site Assessment Clean;Dry;Intact 06/22/2018  8:00 PM  Line Status Infusing;Flushed;Blood return noted 06/22/2018  8:00 PM  Dressing Type Transparent 06/22/2018  8:00 PM  Dressing Status Clean;Dry;Intact 06/22/2018  8:00 PM  Line Care Connections checked and tightened 06/22/2018  8:00 PM  Line Adjustment (NICU/IV Team Only) No 06/22/2018  8:00 PM  Dressing Intervention Dressing changed;Antimicrobial disc changed 06/20/2018  8:34 PM  Dressing Change Due 06/27/18 06/22/2018  8:00 PM     Gastrostomy/Enterostomy Gastrostomy 16 Fr. Other  (comment) (Active)  Surrounding Skin Unable to view 06/23/2018  4:00 AM  Tube Status Patent 06/23/2018  4:00 AM  Drainage Appearance None 06/23/2018  4:00 AM  Dressing Status Clean;Dry;Intact 06/23/2018  4:00 AM     Urethral Catheter T King RN Coude 20 Fr. (Active)  Indication for Insertion or Continuance of Catheter Chronic catheter use 06/23/2018  4:00 AM  Site Assessment Clean;Dry;Intact 06/23/2018  4:00 AM  Catheter Maintenance Bag below level of bladder;Catheter secured;Drainage bag/tubing not touching floor;Insertion date on drainage bag;No dependent loops;Seal intact 06/23/2018  4:00 AM  Collection Container Standard drainage bag 06/23/2018  4:00 AM  Securement Method Leg strap 06/23/2018  4:00 AM  Urinary Catheter Interventions Unclamped 06/23/2018  4:00 AM  Output (mL) 250 mL 06/23/2018  6:32 AM    Anti-infectives:  Anti-infectives (From admission, onward)   Start     Dose/Rate Route Frequency Ordered Stop   06/22/18 0600  piperacillin-tazobactam (ZOSYN) IVPB 3.375 g     3.375 g 12.5 mL/hr over 240 Minutes Intravenous Every 8 hours 06/27/2018 2326     06/28/2018 2030  Ampicillin-Sulbactam (UNASYN) 3 g in sodium chloride 0.9 % 100 mL IVPB  Status:  Discontinued     3 g 200 mL/hr over 30 Minutes Intravenous Every 6 hours 07/13/2018 1927 07/12/2018 2302   06/17/2018 1815  vancomycin (VANCOCIN) IVPB 1000 mg/200 mL premix  Status:  Discontinued     1,000 mg 200 mL/hr over 60 Minutes Intravenous  Once 06/30/2018 1814 07/04/2018 1854   07/14/2018 1450  vancomycin (VANCOCIN) 1-5 GM/200ML-% IVPB    Note to Pharmacy:  Salley Scarlet   : cabinet override  07/09/2018 1450 06/22/18 0314   06/20/18 1500  amoxicillin-clavulanate (AUGMENTIN) 400-57 MG/5ML suspension 500 mg  Status:  Discontinued     500 mg Per Tube Every 12 hours 06/20/18 1351 07/09/2018 1854   06/20/18 1400  amoxicillin-clavulanate (AUGMENTIN) 250-62.5 MG/5ML suspension 500 mg  Status:  Discontinued     500 mg Per Tube Every 12 hours  06/20/18 1351 06/20/18 1351   06/13/18 1400  Ampicillin-Sulbactam (UNASYN) 3 g in sodium chloride 0.9 % 100 mL IVPB  Status:  Discontinued     3 g 200 mL/hr over 30 Minutes Intravenous Every 6 hours 06/13/18 1325 06/20/18 1351   06/11/18 1600  metroNIDAZOLE (FLAGYL) IVPB 500 mg  Status:  Discontinued     500 mg 100 mL/hr over 60 Minutes Intravenous Every 8 hours 06/11/18 1500 06/13/18 1305   06/10/18 2200  cefTRIAXone (ROCEPHIN) 2 g in sodium chloride 0.9 % 100 mL IVPB  Status:  Discontinued     2 g 200 mL/hr over 30 Minutes Intravenous Every 24 hours 06/10/18 1501 06/13/18 1305   06/09/18 2300  cefTRIAXone (ROCEPHIN) 1 g in sodium chloride 0.9 % 100 mL IVPB  Status:  Discontinued     1 g 200 mL/hr over 30 Minutes Intravenous Every 24 hours 06/09/18 0034 06/10/18 1119   06/04/2018 2300  cefTRIAXone (ROCEPHIN) 1 g in sodium chloride 0.9 % 100 mL IVPB     1 g 200 mL/hr over 30 Minutes Intravenous  Once 05/29/2018 2257 06/09/18 0051      Microbiology: Results for orders placed or performed during the hospital encounter of 05/22/2018  Urine Culture     Status: Abnormal   Collection Time: 06/07/2018  7:51 PM  Result Value Ref Range Status   Specimen Description   Final    URINE, RANDOM Performed at [facility], [address] [place], [place] [address]    Special Requests   Final    NONE Performed at [facility], [address] [place], [place] [address]    Culture >=100,000 COLONIES/mL PROTEUS MIRABILIS (A)  Final   Report Status 06/11/2018 FINAL  Final   Organism ID, Bacteria PROTEUS MIRABILIS (A)  Final      Susceptibility   Proteus mirabilis - MIC*    AMPICILLIN <=2 SENSITIVE Sensitive     CEFAZOLIN <=4 SENSITIVE Sensitive     CEFTRIAXONE <=1 SENSITIVE Sensitive     CIPROFLOXACIN <=0.25 SENSITIVE Sensitive     GENTAMICIN <=1 SENSITIVE Sensitive     IMIPENEM 4 SENSITIVE Sensitive     NITROFURANTOIN 128 RESISTANT Resistant     TRIMETH/SULFA <=20 SENSITIVE  Sensitive     AMPICILLIN/SULBACTAM <=2 SENSITIVE Sensitive     PIP/TAZO <=4 SENSITIVE Sensitive     * >=100,000 COLONIES/mL PROTEUS MIRABILIS  Blood Culture ID Panel (Reflexed)     Status: Abnormal   Collection Time: 06/10/2018 10:57 PM  Result Value Ref Range Status   Enterococcus species NOT DETECTED NOT DETECTED Final   Listeria monocytogenes NOT DETECTED NOT DETECTED Final   Staphylococcus species NOT DETECTED NOT DETECTED Final   Staphylococcus aureus (BCID) NOT DETECTED NOT DETECTED Final   Streptococcus species NOT DETECTED NOT DETECTED Final   Streptococcus agalactiae NOT DETECTED NOT DETECTED Final   Streptococcus pneumoniae NOT DETECTED NOT DETECTED Final   Streptococcus pyogenes NOT DETECTED NOT DETECTED Final   Acinetobacter baumannii NOT DETECTED NOT DETECTED Final   Enterobacteriaceae species DETECTED (A) NOT DETECTED Final    Comment: Enterobacteriaceae represent a large family of  gram-negative bacteria, not a single organism. CRITICAL RESULT CALLED TO, READ BACK BY AND VERIFIED WITH: JASON ROBBINS AT 1936 06/10/18.PMH    Enterobacter cloacae complex NOT DETECTED NOT DETECTED Final   Escherichia coli NOT DETECTED NOT DETECTED Final   Klebsiella oxytoca NOT DETECTED NOT DETECTED Final   Klebsiella pneumoniae NOT DETECTED NOT DETECTED Final   Proteus species DETECTED (A) NOT DETECTED Final    Comment: CRITICAL RESULT CALLED TO, READ BACK BY AND VERIFIED WITH: JASON ROBBINS AT 1936 06/10/18.PMH    Serratia marcescens NOT DETECTED NOT DETECTED Final   Carbapenem resistance NOT DETECTED NOT DETECTED Final   Haemophilus influenzae NOT DETECTED NOT DETECTED Final   Neisseria meningitidis NOT DETECTED NOT DETECTED Final   Pseudomonas aeruginosa NOT DETECTED NOT DETECTED Final   Candida albicans NOT DETECTED NOT DETECTED Final   Candida glabrata NOT DETECTED NOT DETECTED Final   Candida krusei NOT DETECTED NOT DETECTED Final   Candida parapsilosis NOT DETECTED NOT DETECTED  Final   Candida tropicalis NOT DETECTED NOT DETECTED Final    Comment: Performed at [facility], [address] [place], [place] [address]  Blood culture (routine x 2)     Status: Abnormal   Collection Time: 06-14-2018 11:40 PM  Result Value Ref Range Status   Specimen Description   Final    BLOOD LEFT ANTECUBITAL Performed at [facility], [address] [place], [place] [address]    Special Requests   Final    BOTTLES DRAWN AEROBIC AND ANAEROBIC Blood Culture results may not be optimal due to an excessive volume of blood received in culture bottles Performed at [facility], [address], [place], [place] [address]    Culture  Setup Time   Final    GRAM POSITIVE RODS ANAEROBIC BOTTLE ONLY CRITICAL VALUE NOTED.  VALUE IS CONSISTENT WITH PREVIOUSLY REPORTED AND CALLED VALUE. GRAM NEGATIVE RODS AEROBIC BOTTLE ONLY CRITICAL RESULT CALLED TO, READ BACK BY AND VERIFIED WITH: JASON ROBBINS AT 1936 06/10/18.PMH Performed at [facility], [address] [place], [place] [address]    Culture (A)  Final    PROTEUS MIRABILIS SUSCEPTIBILITIES PERFORMED ON PREVIOUS CULTURE WITHIN THE LAST 5 DAYS. CLOSTRIDIUM SPECIES Standardized susceptibility testing for this organism is not available. Performed at [facility], [address], [place], [place] [address]    Report Status 06/13/2018 FINAL  Final  Blood culture (routine x 2)     Status: Abnormal   Collection Time: 06-14-2018 11:40 PM  Result Value Ref Range Status   Specimen Description   Final    BLOOD RIGHT ANTECUBITAL Performed at [facility], [address] [place], [place] [address]    Special Requests   Final    BOTTLES DRAWN AEROBIC AND ANAEROBIC Blood Culture results may not be optimal due to an excessive volume of blood received in culture bottles Performed at [facility], [address], [place], [place] [address]    Culture  Setup Time   Final    GRAM  POSITIVE RODS IN BOTH AEROBIC AND ANAEROBIC BOTTLES CRITICAL RESULT CALLED TO, READ BACK BY AND VERIFIED WITH: HANK ZOMPA 06/09/18 1142 REC    Culture (A)  Final    PROTEUS MIRABILIS CLOSTRIDIUM SPECIES Standardized susceptibility testing for this organism is not available. Performed at [facility], [address] [place], [place] [address]    Report Status 06/13/2018 FINAL  Final   Organism ID, Bacteria PROTEUS MIRABILIS  Final  Susceptibility   Proteus mirabilis - MIC*    AMPICILLIN <=2 SENSITIVE Sensitive     CEFAZOLIN <=4 SENSITIVE Sensitive     CEFEPIME <=1 SENSITIVE Sensitive     CEFTAZIDIME <=1 SENSITIVE Sensitive     CEFTRIAXONE <=1 SENSITIVE Sensitive     CIPROFLOXACIN <=0.25 SENSITIVE Sensitive     GENTAMICIN <=1 SENSITIVE Sensitive     IMIPENEM 2 SENSITIVE Sensitive     TRIMETH/SULFA <=20 SENSITIVE Sensitive     AMPICILLIN/SULBACTAM <=2 SENSITIVE Sensitive     PIP/TAZO <=4 SENSITIVE Sensitive     * PROTEUS MIRABILIS  Blood Culture ID Panel (Reflexed)     Status: None   Collection Time: 06/07/2018 11:40 PM  Result Value Ref Range Status   Enterococcus species NOT DETECTED NOT DETECTED Final   Listeria monocytogenes NOT DETECTED NOT DETECTED Final   Staphylococcus species NOT DETECTED NOT DETECTED Final   Staphylococcus aureus (BCID) NOT DETECTED NOT DETECTED Final   Streptococcus species NOT DETECTED NOT DETECTED Final   Streptococcus agalactiae NOT DETECTED NOT DETECTED Final   Streptococcus pneumoniae NOT DETECTED NOT DETECTED Final   Streptococcus pyogenes NOT DETECTED NOT DETECTED Final   Acinetobacter baumannii NOT DETECTED NOT DETECTED Final   Enterobacteriaceae species NOT DETECTED NOT DETECTED Final   Enterobacter cloacae complex NOT DETECTED NOT DETECTED Final   Escherichia coli NOT DETECTED NOT DETECTED Final   Klebsiella oxytoca NOT DETECTED NOT DETECTED Final   Klebsiella pneumoniae NOT DETECTED NOT DETECTED Final   Proteus species NOT  DETECTED NOT DETECTED Final   Serratia marcescens NOT DETECTED NOT DETECTED Final   Haemophilus influenzae NOT DETECTED NOT DETECTED Final   Neisseria meningitidis NOT DETECTED NOT DETECTED Final   Pseudomonas aeruginosa NOT DETECTED NOT DETECTED Final   Candida albicans NOT DETECTED NOT DETECTED Final   Candida glabrata NOT DETECTED NOT DETECTED Final   Candida krusei NOT DETECTED NOT DETECTED Final   Candida parapsilosis NOT DETECTED NOT DETECTED Final   Candida tropicalis NOT DETECTED NOT DETECTED Final    Comment: Performed at [facility], [address] [place], [place] [address]  MRSA PCR Screening     Status: None   Collection Time: 07/11/2018 11:46 AM  Result Value Ref Range Status   MRSA by PCR NEGATIVE NEGATIVE Final    Comment:        The GeneXpert MRSA Assay (FDA approved for NASAL specimens only), is one component of a comprehensive MRSA colonization surveillance program. It is not intended to diagnose MRSA infection nor to guide or monitor treatment for MRSA infections. Performed at [facility], [address] [place], [place] [address]   Culture, respiratory (non-expectorated)     Status: None (Preliminary result)   Collection Time: 06/30/2018  6:28 PM  Result Value Ref Range Status   Specimen Description   Final    TRACHEAL ASPIRATE Performed at [facility], [address], [place], [place] [address]    Special Requests   Final    NONE Performed at [facility], [address] [place], [place] [address]    Gram Stain   Final    ABUNDANT WBC PRESENT, PREDOMINANTLY PMN ABUNDANT SQUAMOUS EPITHELIAL CELLS PRESENT NO ORGANISMS SEEN    Culture   Final    NO GROWTH < 24 HOURS Performed at [facility], [address], [place], [place] [address]    Report Status PENDING  Incomplete     Studies: Dg Chest 1 View  Result Date: 06/15/2018  CLINICAL DATA:  Intubation.  Postop gastrostomy. EXAM: CHEST  1 VIEW  COMPARISON:  06-22-18 FINDINGS: Endotracheal tube in good position. Left subclavian central venous catheter tip mid SVC. No pneumothorax. Left lower lobe infiltrate unchanged, possible pneumonia. No effusion. COPD Large pneumoperitoneum again noted and unchanged IMPRESSION: Central line SVC. No pneumothorax. Endotracheal tube in good position. Left lower lobe infiltrate Large pneumoperitoneum Electronically Signed   By: Marlan Palau M.D.   On: 2018/06/22 18:24   Dg Chest 1 View  Addendum Date: 2018-06-22   ADDENDUM REPORT: June 22, 2018 16:58 ADDENDUM: These results were called by telephone at the time of interpretation on 06-22-2018 at 4:58 pm to Dr. Lenard Simmer , who verbally acknowledged these results. Electronically Signed   By: Marlan Palau M.D.   On: 2018-06-22 16:58   Result Date: 22-Jun-2018 CLINICAL DATA:  Oxygen desaturation.  Endoscopy today EXAM: CHEST  1 VIEW COMPARISON:  06/13/2018 FINDINGS: Left lower lobe airspace disease, possible pneumonia. Minimal right lower lobe atelectasis. Negative for heart failure or edema. Apical scarring bilaterally Left arm PICC tip in the SVC. Large pneumoperitoneum IMPRESSION: 1. Left lower lobe infiltrate, possible aspiration pneumonia 2. Large pneumoperitoneum Electronically Signed: By: Marlan Palau M.D. On: 06-22-18 16:52   Dg Abd 1 View  Result Date: 06/22/2018 CLINICAL DATA:  Recent bowel perforation EXAM: ABDOMEN - 1 VIEW COMPARISON:  06/22/18 FINDINGS: There is less apparent pneumoperitoneum compared to 1 day prior. Patient has had abdominal surgery with skin staples overlying the mid abdomen. Nasogastric tube tip is in the stomach with the side port at the gastroesophageal junction. Gastrostomy catheter is positioned in the stomach. There are loops of mildly dilated bowel in a pattern suggesting ileus. Bowel obstruction is not felt to be likely. Catheter is noted in the distal sigmoid region. Rectal thermometer in place. There is  aortoiliac atherosclerosis. IMPRESSION: Suspect a degree of ileus. Bowel obstruction felt to be less likely. Less pneumoperitoneum evident compared to 1 day prior. Tube positions as described. Nasogastric tube side port is at the gastroesophageal junction. Advise advancing nasogastric tube 4-5 cm. There is aortoiliac atherosclerosis. Aortic Atherosclerosis (ICD10-I70.0). Electronically Signed   By: Bretta Bang III M.D.   On: 06/22/2018 07:26   Dg Abd 1 View  Result Date: 22-Jun-2018 CLINICAL DATA:  Perforated bowel EXAM: ABDOMEN - 1 VIEW COMPARISON:  Jun 22, 2018 FINDINGS: Large amount of pneumoperitoneum similar to previous study. This is consistent with the known history of perforated bowel. Scattered gas throughout the colon without significant large or small bowel distention. No radiopaque stones. Vascular calcifications. Catheter in the pelvis could represent bladder or rectal catheter. Degenerative changes in the spine. Surgical clips in the left upper quadrant. IMPRESSION: Large amount of pneumoperitoneum similar to previous study. No evidence of bowel obstruction. Electronically Signed   By: Burman Nieves M.D.   On: 2018-06-22 21:22   Dg Abd 1 View  Result Date: June 22, 2018 CLINICAL DATA:  Low oxygen saturation.  Feeding tube placement. EXAM: ABDOMEN - 1 VIEW COMPARISON:  06/17/2018 FINDINGS: Large pneumoperitoneum Metal clips overlying the stomach. No gastrostomy tube is identified. Foley catheter in the bladder.  No acute bony abnormality. IMPRESSION: Large pneumoperitoneum. Metal clips overlying the stomach presumably related to gastrostomy tube placement. These results were called by telephone at the time of interpretation on Jun 22, 2018 at 4:56 pm to Dr. Karlton Lemon, who verbally acknowledged these results. Electronically Signed   By: Marlan Palau M.D.   On: 2018/06/22 16:58   Dg Abd 1 View  Result  Date: 06/17/2018 CLINICAL DATA:  NG tube placement. EXAM: ABDOMEN - 1 VIEW COMPARISON:   06/17/2018. FINDINGS: Dobbhoff tube noted with its tip now noted over the distal stomach. No bowel distention. Multifocal bilateral subsegmental atelectasis/pulmonary mild infiltrates. IMPRESSION: 1. Dobbhoff tube noted with its tip now noted over the distal stomach. 2. Multifocal mild bilateral subsegmental atelectasis/pulmonary infiltrates. Electronically Signed   By: Maisie Fus  Register   On: 06/17/2018 15:25   Dg Abd 1 View  Result Date: 06/17/2018 CLINICAL DATA:  Feeding tube placement EXAM: ABDOMEN - 1 VIEW COMPARISON:  CT abdomen 06/13/2018 FINDINGS: The feeding tube extends into the stomach and is facing cephalad in the stomach fundus region. Tubing projecting over the right chest likely represents a PICC line. Emphysema noted.  Compression fractures observed at T12 and L2. IMPRESSION: 1. Feeding tube tip is in the stomach fundus, oriented cephalad. 2.  Emphysema (ICD10-J43.9). 3. Compression fractures at T12 and L2. Electronically Signed   By: Gaylyn Rong M.D.   On: 06/17/2018 12:39   Ct Head Wo Contrast  Result Date: 06/16/2018 CLINICAL DATA:  Altered mental status, history of seizures, failure to thrive, former smoker, history of prior subdural hematoma EXAM: CT HEAD WITHOUT CONTRAST TECHNIQUE: Contiguous axial images were obtained from the base of the skull through the vertex without intravenous contrast. Sagittal and coronal MPR images reconstructed from axial data set. COMPARISON:  None FINDINGS: Brain: Generalized cerebral and cerebellar atrophy. Normal ventricular morphology. No midline shift or mass effect. Small vessel chronic ischemic changes of deep cerebral white matter. 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 No intracranial hemorrhage, mass lesion, evidence of acute infarction, or extra-axial fluid collection. Vascular: Mild atherosclerotic calcification of internal carotid arteries bilaterally at skull base. No hyperdense vessels. Skull: Demineralized but intact Sinuses/Orbits:  Clear paranasal sinuses. Minimal fluid within a few RIGHT mastoid air cells. Other: N/A IMPRESSION: Atrophy with small vessel chronic ischemic changes of deep cerebral white matter. 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 No acute intracranial abnormalities. Electronically Signed   By: Ulyses Southward M.D.   On: 06/16/2018 15:32   Ct Chest W Contrast  Result Date: 06/13/2018 CLINICAL DATA:  Unintentional weight loss. EXAM: CT CHEST, ABDOMEN, AND PELVIS WITH CONTRAST TECHNIQUE: Multidetector CT imaging of the chest, abdomen and pelvis was performed following the standard protocol during bolus administration of intravenous contrast. CONTRAST:  50mL OMNIPAQUE IOHEXOL 300 MG/ML  SOLN COMPARISON:  None. FINDINGS: CT CHEST FINDINGS Cardiovascular: Small pericardial effusion with a maximum thickness of 10 mm. Atheromatous calcifications, including the coronary arteries and aorta. Mediastinum/Nodes: No enlarged mediastinal, hilar, or axillary lymph nodes. Thyroid gland, trachea, and esophagus demonstrate no significant findings. Lungs/Pleura: The lungs are hyperexpanded with mild bullous changes. Irregular patchy densities in the right lower lobe and inferior right upper lobe. Minimal bilateral dependent atelectasis. No pleural fluid. Musculoskeletal: Approximately 30% T11 vertebral body superior endplate compression deformity with sclerosis and minimal bony retropulsion. No acute fracture lines. CT ABDOMEN PELVIS FINDINGS Hepatobiliary: No focal liver abnormality is seen. No gallstones, gallbladder wall thickening, or biliary dilatation. Pancreas: Unremarkable. No pancreatic ductal dilatation or surrounding inflammatory changes. Spleen: Normal in size without focal abnormality. Adrenals/Urinary Tract: Foley catheter in the urinary bladder with associated air in the bladder. Multiple dependent calculi in the urinary bladder. These are difficult to separate from each other with the largest individual calculus that can be  separated measuring 5 mm in diameter. Normal appearing adrenal glands, kidneys and ureters. Stomach/Bowel: Limited due to the lack of intravenous and  oral contrast and lack of body fat. No gross abnormality of the stomach, small bowel or colon seen. No evidence of appendicitis. Vascular/Lymphatic: Atheromatous arterial calcifications without aneurysm. No enlarged lymph nodes. Reproductive: Normal sized prostate gland. Other: No abdominal wall hernia or abnormality. No abdominopelvic ascites. Musculoskeletal: Changes of avascular necrosis involving both femoral heads without bony collapse. Approximately 40% old L2 superior endplate compression deformity with minimal bony retropulsion, sclerosis and no acute fracture lines. Mild lumbar spine degenerative changes. IMPRESSION: 1. Patchy densities in the right lower lobe and inferior right upper lobe, compatible with incompletely resolved pneumonia. Follow-up chest radiographs are recommended until this completely clears. 2. Mild-to-moderate changes of COPD. 3. Small pericardial effusion. 4. Multiple dependent calculi in the urinary bladder. 5. 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 Aortic Atherosclerosis (ICD10-I70.0) and Emphysema (ICD10-J43.9). Electronically Signed   By: Beckie Salts M.D.   On: 06/13/2018 15:45   Ct Abdomen Pelvis W Contrast  Result Date: 06/13/2018 CLINICAL DATA:  Unintentional weight loss. EXAM: CT CHEST, ABDOMEN, AND PELVIS WITH CONTRAST TECHNIQUE: Multidetector CT imaging of the chest, abdomen and pelvis was performed following the standard protocol during bolus administration of intravenous contrast. CONTRAST:  50mL OMNIPAQUE IOHEXOL 300 MG/ML  SOLN COMPARISON:  None. FINDINGS: CT CHEST FINDINGS Cardiovascular: Small pericardial effusion with a maximum thickness of 10 mm. Atheromatous calcifications, including the coronary arteries and aorta. Mediastinum/Nodes: No enlarged mediastinal, hilar, or axillary lymph  nodes. Thyroid gland, trachea, and esophagus demonstrate no significant findings. Lungs/Pleura: The lungs are hyperexpanded with mild bullous changes. Irregular patchy densities in the right lower lobe and inferior right upper lobe. Minimal bilateral dependent atelectasis. No pleural fluid. Musculoskeletal: Approximately 30% T11 vertebral body superior endplate compression deformity with sclerosis and minimal bony retropulsion. No acute fracture lines. CT ABDOMEN PELVIS FINDINGS Hepatobiliary: No focal liver abnormality is seen. No gallstones, gallbladder wall thickening, or biliary dilatation. Pancreas: Unremarkable. No pancreatic ductal dilatation or surrounding inflammatory changes. Spleen: Normal in size without focal abnormality. Adrenals/Urinary Tract: Foley catheter in the urinary bladder with associated air in the bladder. Multiple dependent calculi in the urinary bladder. These are difficult to separate from each other with the largest individual calculus that can be separated measuring 5 mm in diameter. Normal appearing adrenal glands, kidneys and ureters. Stomach/Bowel: Limited due to the lack of intravenous and oral contrast and lack of body fat. No gross abnormality of the stomach, small bowel or colon seen. No evidence of appendicitis. Vascular/Lymphatic: Atheromatous arterial calcifications without aneurysm. No enlarged lymph nodes. Reproductive: Normal sized prostate gland. Other: No abdominal wall hernia or abnormality. No abdominopelvic ascites. Musculoskeletal: Changes of avascular necrosis involving both femoral heads without bony collapse. Approximately 40% old L2 superior endplate compression deformity with minimal bony retropulsion, sclerosis and no acute fracture lines. Mild lumbar spine degenerative changes. IMPRESSION: 1. Patchy densities in the right lower lobe and inferior right upper lobe, compatible with incompletely resolved pneumonia. Follow-up chest radiographs are recommended until  this completely clears. 2. Mild-to-moderate changes of COPD. 3. Small pericardial effusion. 4. Multiple dependent calculi in the urinary bladder. 5. 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 Aortic Atherosclerosis (ICD10-I70.0) and Emphysema (ICD10-J43.9). Electronically Signed   By: Beckie Salts M.D.   On: 06/13/2018 15:45   Dg Chest Port 1 View  Result Date: 06/22/2018 CLINICAL DATA:  Hypoxia EXAM: PORTABLE CHEST 1 VIEW COMPARISON:  June 21, 2018 FINDINGS: Endotracheal tube tip is 7.7 cm above the carina. Central catheter tip is in the superior  cava. Nasogastric tube tip is in the stomach with the side port essentially at the gastroesophageal junction. No evident pneumothorax. Lungs are hyperexpanded. There is airspace consolidation consistent with pneumonia in the left lower lobe. There is patchy atelectatic change in the right mid lung and right base regions. The pneumoperitoneum seen 1 day prior is less apparent currently. Note skin staples in the upper abdomen indicating recent abdominal surgery. IMPRESSION: 1. Tube and catheter positions as described without pneumothorax. Side port of the nasogastric tube is at the gastroesophageal junction. It may be prudent to consider advancing the nasogastric tube 4-5 cm. 2. Lungs hyperexpanded. Airspace consolidation left lower lobe consistent with pneumonia. Areas of patchy atelectasis right mid lung and right base. 3.  Pneumoperitoneum less well appreciable compared to 1 day prior. 4.  Stable cardiac silhouette. Electronically Signed   By: Bretta Bang III M.D.   On: 06/22/2018 07:24   Dg Chest Portable 1 View  Result Date: 05/27/2018 CLINICAL DATA:  Cough today with cloudy urine and recent diagnosis of right lower lobe pneumonia 3 weeks ago. History of emphysema and former smoker. EXAM: PORTABLE CHEST 1 VIEW COMPARISON:  None. FINDINGS: Emphysematous hyperinflation of lungs. The patient is slightly rotated on this study  accentuating the paramediastinal soft tissues on the right. Partial clearing of previously noted right lower lobe pneumonia. Minimal right upper lobe pneumonia is also noted, similar in appearance to prior abutting the minor fissure. No new pulmonary consolidations. No effusion or pneumothorax. Mild aortic atherosclerosis. No acute osseous abnormality. IMPRESSION: Partial clearing of right sided pneumonia in the background of emphysema. Electronically Signed   By: Tollie Eth M.D.   On: 05/17/2018 23:32   Dg Kayleen Memos W/water Sol Cm  Result Date: 06/22/2018 CLINICAL DATA:  Gastric perforation following PEG tube placement. Subsequent gastric repair. Evaluate for integrity of repair. EXAM: WATER SOLUBLE UPPER GI SERIES TECHNIQUE: Single-column upper GI series was performed using water soluble contrast. CONTRAST:  ISOVUE-300 IOPAMIDOL (ISOVUE-300) INJECTION 61% COMPARISON:  None FLUOROSCOPY TIME:  Fluoroscopy Time:  3 minutes 18 seconds Radiation Exposure Index (if provided by the fluoroscopic device): 22.7 mGy Number of Acquired Spot Images: 0 FINDINGS: Real-time fluoroscopy was utilized for evaluation of the stomach. Isovue 300 contrast was hand injected through an existing nasogastric tube opacifying the stomach. Balloon from the PEG tube is delineated within the stomach. Patient was position in the right lateral decubitus position for contrast to flow into the body and antrum of the stomach. No extraluminal contrast is identified to suggest persistent perforation. IMPRESSION: No extraluminal contrast to suggest active gastric perforation. Electronically Signed   By: Elige Ko   On: 06/22/2018 13:10   [place] Ekg Site Rite  Result Date: 06/23/2018 If Site Rite image not attached, placement could not be confirmed due to current cardiac rhythm.  [place] Ekg Site Rite  Result Date: 06/13/2018 If Site Rite image not attached, placement could not be confirmed due to current cardiac rhythm.   Consults: Treatment  Team:  Barbaraann Rondo, MD Carolan Shiver, MD   Subjective:    Overnight Issues: Patient with hemoglobin drop, no clear evidence of bleeding, pending 2 units of packed red blood cell did not tolerate SBT yesterday  Objective:  Vital signs for last 24 hours: Temp:  [96.1 F (35.6 C)-99.5 F (37.5 C)] 96.1 F (35.6 C) (10/10 0715) Pulse Rate:  [65-119] 72 (10/10 0715) Resp:  [7-20] 11 (10/10 0715) BP: (61-177)/(42-93) 95/50 (10/10 0715) SpO2:  [95 %-100 %] 96 % (  10/10 0715) FiO2 (%):  [40 %] 40 % (10/10 0401) Weight:  [35.8 kg] 35.8 kg (10/10 0441)  Hemodynamic parameters for last 24 hours: CVP:  [5 mmHg-7 mmHg] 7 mmHg  Intake/Output from previous day: 10/09 0701 - 10/10 0700 In: 4726.4 [I.V.:2849.3; NG/GT:194.3; IV Piggyback:1682.8] Out: 400 [Urine:400]  Intake/Output this shift: No intake/output data recorded.  Vent settings for last 24 hours: Vent Mode: PRVC FiO2 (%):  [40 %] 40 % Set Rate:  [14 bmp] 14 bmp Vt Set:  [500 mL] 500 mL PEEP:  [5 cmH20] 5 cmH20 Pressure Support:  [5 cmH20] 5 cmH20 Plateau Pressure:  [17 cmH20-19 cmH20] 17 cmH20  Physical Exam:  Vital signs: Please see the above listed vital signs HEENT: Trachea is midline, patient orally intubated, no accessory muscle utilization noted Cardiovascular: Regular rate and rhythm Pulmonary: Coarse rhonchi appreciated left greater than right Abdominal exam: G-tube noted, cachectic and wasted, soft exam Extremities: No clubbing, cyanosis or edema noted Neurologic: Limited exam, patient sedated  Assessment/Plan:   Respiratory failure.  Did not tolerate spontaneous awakening and breathing trial will try again today transfusion  Status post repair gastric laceration gastrorrhaphy and gastrotomy  Protein calorie malnutrition, severe wasting.  Additional support as tolerated  Anemia.  Decrease hemoglobin to 5.6, no clear blood loss noted, will transfuse 2 units of packed red blood cells, will hold  on CT abdomen unless surgery feels indicated  Hypokalemia.  Will replace  Critical care time 35 minutes  Tora Kindred, DO  Wilver Tignor 06/23/2018  *Care during the described time interval was provided by me and/or other providers on the critical care team.  I have reviewed this patient's available data, including medical history, events of note, physical examination and test results as part of my evaluation.

## 2018-06-23 NOTE — Progress Notes (Signed)
Called and updated Leonie Man (daughter).  Verified son's phone number with [name] and asked if she could attempt to get in touch with son and have him call us.  Son's number confirmed to be correct. [name] verbalized she would attempt to contact him.

## 2018-06-23 NOTE — Consult Note (Signed)
Pharmacy Electrolyte Monitoring Consult:  Pharmacy consulted to assist in monitoring and replacing electrolytes in this 72 y.o. male admitted on 05/25/2018 with Urinary Retention   Labs:  Sodium (mmol/L)  Date Value  06/23/2018 135   Potassium (mmol/L)  Date Value  06/23/2018 3.0 (L)   Magnesium (mg/dL)  Date Value  16/06/9603 2.1   Phosphorus (mg/dL)  Date Value  54/05/8118 2.9   Calcium (mg/dL)  Date Value  14/78/2956 7.3 (L)   Albumin (g/dL)  Date Value  21/30/8657 2.1 (L)   Corrected Ca = 8.82  Assessment/Plan: 10/10 @ 0500 K+ 3.0 Patient ordered KCl 10 mEq IV x 4, will also switch fluids to D5 + LR + KCI 20 mEq @ 125 ml/hr considering patient was a bit acidotic on his last BMP. Will recheck electrolytes w/ am labs.  Pharmacy will continue to monitor.   Thomasene Ripple, PharmD, BCPS Clinical Pharmacist 06/23/2018

## 2018-06-23 NOTE — Progress Notes (Signed)
Pt successfully extubated O2 sats 100% on 5L via nasal canula, no respiratory distress present, and vss.  Will continue to monitor and assess pt.  Sonda Rumble, AGNP  Pulmonary/Critical Care Pager [phone] (please enter 7 digits) PCCM Consult Pager [phone] (please enter 7 digits) a

## 2018-06-23 NOTE — Progress Notes (Signed)
I spoke with pts son Edmond Ginsberg to inform him his father is ready for extubation and discussed if he would want pt placed back on life support if he were to fail extubation. Mr. Yadav stated he would like to attempt to get his father off the ventilator today, and if he were to fail extubation he would like the pt to be reintubated. Mr. Buer plans to travel from [place] and arrive at pts bedside on Sunday Sept. 13, 2019, therefore he would like aggressive treatment until he arrives at the hospital.  I also discussed plan of care with pts sister [name] and she is in agreement with this plan.  Sonda Rumble, AGNP  Pulmonary/Critical Care Pager [phone] (please enter 7 digits) PCCM Consult Pager [phone] (please enter 7 digits)

## 2018-06-23 NOTE — Progress Notes (Signed)
Patient extubated per order by Peyton Najjar with cardiopulmonary.  Patient placed on 2L nasal cannula.  Patient appears comfortable but is not attempting to follow any simple commands or communicate.  No respiratory distress noted upon extubation.  Nurse will continue to monitor.

## 2018-06-23 NOTE — Progress Notes (Signed)
Patient ID: Alexander Lara, male   DOB: 05-08-1946, 72 y.o.   MRN: [id number]   [facility] PROGRESS NOTE  Alexander Lara IRW:[id number] DOB: [birth year] DOA: 05/31/2018 PCP: [facility] [place]  HPI/Subjective: Remains on vent  Objective: Vitals:   06/23/18 1245 06/23/18 1330  BP: 101/66   Pulse: 96 96  Resp: 14 11  Temp: (!) 96.4 F (35.8 C) (!) 97.2 F (36.2 C)  SpO2: 100% 99%    Filed Weights   06/19/2018 1747 06/22/18 0430 06/23/18 0441  Weight: 35.6 kg 35.6 kg 35.8 kg    ROS: Review of Systems  Unable to perform ROS: Acuity of condition   Exam: Physical Exam  Constitutional: He appears cachectic.  HENT:  Nose: No mucosal edema.  Mouth/Throat: No oropharyngeal exudate or posterior oropharyngeal edema.  Eyes: Pupils are equal, round, and reactive to light. Conjunctivae and lids are normal.  Neck: No JVD present. Carotid bruit is not present. No edema present. No thyroid mass and no thyromegaly present.  Cardiovascular: S1 normal and S2 normal. An irregularly irregular rhythm present. Exam reveals no gallop.  No murmur heard. Pulses:      Dorsalis pedis pulses are 2+ on the right side, and 2+ on the left side.  Respiratory: No respiratory distress. He has no wheezes. He has no rhonchi. He has no rales.  GI: Soft. Bowel sounds are normal. There is no tenderness.  Musculoskeletal:       Right ankle: He exhibits no swelling.       Left ankle: He exhibits no swelling.  Lymphadenopathy:    He has no cervical adenopathy.  Neurological: He is unresponsive.  Skin: Skin is warm. No rash noted. Nails show no clubbing.      Data Reviewed: Basic Metabolic Panel: Recent Labs  Lab 06/18/18 0426  06/19/18 0514 06/19/18 1859 06/20/18 0453 06/17/2018 0426 06/27/2018 2002 06/22/18 0442 06/23/18 0357 06/23/18 1202  NA 146*  --   --  137  --  137 133* 137 135  --   K 2.8*   < > 3.7 3.6 3.6 3.8 3.9 3.8 3.0* 4.3  CL 118*  --   --  111  --  110 106 112* 111  --   CO2 22   --   --  21*  --  22 19* 20* 18*  --   GLUCOSE 117*  --   --  162*  --  93 164* 169* 161*  --   BUN 13  --   --  15  --  _0 --   CREATININE 0.63  --   --  0.58*  --  0.60* 0.51* 0.63 0.63  --   CALCIUM 7.7*  --   --  7.2*  --  7.5* 7.5* 7.3* 7.3*  --   MG 1.8  --  1.7  --  1.8 2.0  --  1.8 2.1  --   PHOS 1.7*  --  1.8*  --  2.5 2.5  --  2.9  --   --    < > = values in this interval not displayed.   CBC: Recent Labs  Lab 06/18/18 0426 06/20/18 0453 06/23/2018 2002 06/22/18 0442 06/23/18 0357  WBC 8.2 11.9* 11.5* 15.1* 7.9  NEUTROABS  --   --   --   --  6.8  HGB 8.8* 8.4* 7.9* 7.4* 5.6*  HCT 26.7* 25.7* 25.0* 22.9* 17.5*  MCV 78.1* 79.2* 80.9 80.6 81.4  PLT 256  225 258 261 216   Cardiac Enzymes:   Recent Results (from the past 240 hour(s))  MRSA PCR Screening     Status: None   Collection Time: 06/23/2018 11:46 AM  Result Value Ref Range Status   MRSA by PCR NEGATIVE NEGATIVE Final    Comment:        The GeneXpert MRSA Assay (FDA approved for NASAL specimens only), is one component of a comprehensive MRSA colonization surveillance program. It is not intended to diagnose MRSA infection nor to guide or monitor treatment for MRSA infections. Performed at [facility], [address] [place], [place] [address]   Culture, respiratory (non-expectorated)     Status: None (Preliminary result)   Collection Time: 06/30/2018  6:28 PM  Result Value Ref Range Status   Specimen Description   Final    TRACHEAL ASPIRATE Performed at [facility], [address] [place], [place] [address]    Special Requests   Final    NONE Performed at [facility], [address] [place], [place] [address]    Gram Stain   Final    ABUNDANT WBC PRESENT, PREDOMINANTLY PMN ABUNDANT SQUAMOUS EPITHELIAL CELLS PRESENT NO ORGANISMS SEEN Performed at [facility], [place] [address], [place],  [address]    Culture RARE YEAST  Final   Report Status  PENDING  Incomplete     Studies: Dg Chest 1 View  Result Date: 07/14/2018 CLINICAL DATA:  Intubation.  Postop gastrostomy. EXAM: CHEST  1 VIEW COMPARISON:  06/14/2018 FINDINGS: Endotracheal tube in good position. Left subclavian central venous catheter tip mid SVC. No pneumothorax. Left lower lobe infiltrate unchanged, possible pneumonia. No effusion. COPD Large pneumoperitoneum again noted and unchanged IMPRESSION: Central line SVC. No pneumothorax. Endotracheal tube in good position. Left lower lobe infiltrate Large pneumoperitoneum Electronically Signed   By: Franchot Gallo M.D.   On: 06/19/2018 18:24   Dg Chest 1 View  Addendum Date: 06/20/2018   ADDENDUM REPORT: 07/08/2018 16:58 ADDENDUM: These results were called by telephone at the time of interpretation on 06/16/2018 at 4:58 pm to Dr. Martha Clan , who verbally acknowledged these results. Electronically Signed   By: Franchot Gallo M.D.   On: 06/25/2018 16:58   Result Date: 07/09/2018 CLINICAL DATA:  Oxygen desaturation.  Endoscopy today EXAM: CHEST  1 VIEW COMPARISON:  06/07/2018 FINDINGS: Left lower lobe airspace disease, possible pneumonia. Minimal right lower lobe atelectasis. Negative for heart failure or edema. Apical scarring bilaterally Left arm PICC tip in the SVC. Large pneumoperitoneum IMPRESSION: 1. Left lower lobe infiltrate, possible aspiration pneumonia 2. Large pneumoperitoneum Electronically Signed: By: Franchot Gallo M.D. On: 07/04/2018 16:52   Dg Abd 1 View  Result Date: 06/22/2018 CLINICAL DATA:  Recent bowel perforation EXAM: ABDOMEN - 1 VIEW COMPARISON:  June 21, 2018 FINDINGS: There is less apparent pneumoperitoneum compared to 1 day prior. Patient has had abdominal surgery with skin staples overlying the mid abdomen. Nasogastric tube tip is in the stomach with the side port at the gastroesophageal junction. Gastrostomy catheter is positioned in the stomach. There are loops of mildly dilated bowel in a pattern  suggesting ileus. Bowel obstruction is not felt to be likely. Catheter is noted in the distal sigmoid region. Rectal thermometer in place. There is aortoiliac atherosclerosis. IMPRESSION: Suspect a degree of ileus. Bowel obstruction felt to be less likely. Less pneumoperitoneum evident compared to 1 day prior. Tube positions as described. Nasogastric tube side port is at the gastroesophageal junction.  Advise advancing nasogastric tube 4-5 cm. There is aortoiliac atherosclerosis. Aortic Atherosclerosis (ICD10-I70.0). Electronically Signed   By: Lowella Grip III M.D.   On: 06/22/2018 07:26   Dg Abd 1 View  Result Date: 06/18/2018 CLINICAL DATA:  Perforated bowel EXAM: ABDOMEN - 1 VIEW COMPARISON:  07/09/2018 FINDINGS: Large amount of pneumoperitoneum similar to previous study. This is consistent with the known history of perforated bowel. Scattered gas throughout the colon without significant large or small bowel distention. No radiopaque stones. Vascular calcifications. Catheter in the pelvis could represent bladder or rectal catheter. Degenerative changes in the spine. Surgical clips in the left upper quadrant. IMPRESSION: Large amount of pneumoperitoneum similar to previous study. No evidence of bowel obstruction. Electronically Signed   By: Lucienne Capers M.D.   On: 07/05/2018 21:22   Dg Abd 1 View  Result Date: 07/05/2018 CLINICAL DATA:  Low oxygen saturation.  Feeding tube placement. EXAM: ABDOMEN - 1 VIEW COMPARISON:  06/17/2018 FINDINGS: Large pneumoperitoneum Metal clips overlying the stomach. No gastrostomy tube is identified. Foley catheter in the bladder.  No acute bony abnormality. IMPRESSION: Large pneumoperitoneum. Metal clips overlying the stomach presumably related to gastrostomy tube placement. These results were called by telephone at the time of interpretation on 06/19/2018 at 4:56 pm to Dr. Rosey Bath, who verbally acknowledged these results. Electronically Signed   By: Franchot Gallo  M.D.   On: 07/09/2018 16:58   Dg Chest Port 1 View  Result Date: 06/23/2018 CLINICAL DATA:  Ventilated patient.  History of emphysema. EXAM: PORTABLE CHEST 1 VIEW COMPARISON:  06/22/2018 FINDINGS: The enteric catheter has been removed. Left PICC line and endotracheal tube in stable position. Cardiomediastinal silhouette is normal. Mediastinal contours appear intact. Calcific atherosclerotic disease of the aorta. There is no evidence of pleural effusion or pneumothorax. Focal airspace consolidation the left lower lobe, more prominent than on the prior radiograph. Scattered peribronchial airspace consolidation in the right lower lobe. Baseline emphysematous changes. Osseous structures are without acute abnormality. Decreased volume of known pneumoperitoneum. IMPRESSION: Worsening of left lower thorax airspace consolidation. Stable appearance of patchy peribronchial right lower lobe airspace consolidation. Decreased volume pneumoperitoneum. Electronically Signed   By: Fidela Salisbury M.D.   On: 06/23/2018 11:31   Dg Chest Port 1 View  Result Date: 06/22/2018 CLINICAL DATA:  Hypoxia EXAM: PORTABLE CHEST 1 VIEW COMPARISON:  June 21, 2018 FINDINGS: Endotracheal tube tip is 7.7 cm above the carina. Central catheter tip is in the superior cava. Nasogastric tube tip is in the stomach with the side port essentially at the gastroesophageal junction. No evident pneumothorax. Lungs are hyperexpanded. There is airspace consolidation consistent with pneumonia in the left lower lobe. There is patchy atelectatic change in the right mid lung and right base regions. The pneumoperitoneum seen 1 day prior is less apparent currently. Note skin staples in the upper abdomen indicating recent abdominal surgery. IMPRESSION: 1. Tube and catheter positions as described without pneumothorax. Side port of the nasogastric tube is at the gastroesophageal junction. It may be prudent to consider advancing the nasogastric tube 4-5 cm.  2. Lungs hyperexpanded. Airspace consolidation left lower lobe consistent with pneumonia. Areas of patchy atelectasis right mid lung and right base. 3.  Pneumoperitoneum less well appreciable compared to 1 day prior. 4.  Stable cardiac silhouette. Electronically Signed   By: Lowella Grip III M.D.   On: 06/22/2018 07:24   Dg Duanne Limerick W/water Sol Cm  Result Date: 06/22/2018 CLINICAL DATA:  Gastric perforation following PEG tube placement. Subsequent gastric  repair. Evaluate for integrity of repair. EXAM: WATER SOLUBLE UPPER GI SERIES TECHNIQUE: Single-column upper GI series was performed using water soluble contrast. CONTRAST:  155m ISOVUE-300 IOPAMIDOL (ISOVUE-300) INJECTION 61% COMPARISON:  None FLUOROSCOPY TIME:  Fluoroscopy Time:  3 minutes 18 seconds Radiation Exposure Index (if provided by the fluoroscopic device): 22.7 mGy Number of Acquired Spot Images: 0 FINDINGS: Real-time fluoroscopy was utilized for evaluation of the stomach. Isovue 300 contrast was hand injected through an existing nasogastric tube opacifying the stomach. Balloon from the PEG tube is delineated within the stomach. Patient was position in the right lateral decubitus position for contrast to flow into the body and antrum of the stomach. No extraluminal contrast is identified to suggest persistent perforation. IMPRESSION: No extraluminal contrast to suggest active gastric perforation. Electronically Signed   By: HKathreen Devoid  On: 06/22/2018 13:10   [place] Site Rite  Result Date: 06/23/2018 If Site Rite image not attached, placement could not be confirmed due to current cardiac rhythm.   Scheduled Meds: . chlorhexidine gluconate (MEDLINE KIT)  15 mL Mouth Rinse BID  . famotidine  20 mg Per Tube QHS  . feeding supplement (VITAL 1.5 CAL)  1,000 mL Per Tube Q24H  . free water  60 mL Per Tube Q4H  . insulin aspart  0-9 Units Subcutaneous Q4H  . ipratropium-albuterol  3 mL Nebulization Q6H  . mouth rinse  15 mL Mouth Rinse 10  times per day  . sodium chloride flush  10-40 mL Intracatheter Q12H   Continuous Infusions: . sodium chloride 0 mL/hr at 06/20/18 0407  . dexmedetomidine (PRECEDEX) IV infusion 1 mcg/kg/hr (06/23/18 1200)  . dextrose 5 % and 0.9 % NaCl with KCl 20 mEq/L    . levETIRAcetam Stopped (06/23/18 0542)  . norepinephrine (LEVOPHED) Adult infusion Stopped (06/23/18 0522)  . piperacillin-tazobactam (ZOSYN)  IV 3.375 g (06/23/18 1248)    Assessment/Plan:   1. Acute respiratory failure post surgery to new ventilator remains on vent 2. Status post gastric laceration status post gastrorrhaphy and gastrostomy.  The recommendations per surgery 3. Proteus sepsis from indwelling Foley catheter.  Blood cultures and urine cultures positive.  Foley catheter changed.  This will have to be changed every 4 weeks.  Continue zosyn 4. Clostridium species also and blood culture.  Continue IV zosyn which would cover Clostridium species also.  Family decided against colonoscopy at this point. 5. Failed swallow evaluation.  Status post surgery for gastric tube 6. chronic aspiration pneumonia contineu abx 7. Atrial fibrillation with rapid ventricular response.  Hr stable 8. History of seizures on Keppra 9. Failure to thrive, cachexia and malnutrition.  Overall prognosis is poor.  Patient is a DNR.  Now his condition is even worsened post procedure 10. BPH.  meds on hold   Code Status:     Code Status Orders  (From admission, onward)         Start     Ordered   06/10/18 1120  Do not attempt resuscitation (DNR)  Continuous    Question Answer Comment  In the event of cardiac or respiratory ARREST Do not call a "code blue"   In the event of cardiac or respiratory ARREST Do not perform Intubation, CPR, defibrillation or ACLS   In the event of cardiac or respiratory ARREST Use medication by any route, position, wound care, and other measures to relive pain and suffering. May use oxygen, suction and manual  treatment of airway obstruction as needed for comfort.  06/10/18 1121        Code Status History    Date Active Date Inactive Code Status Order ID Comments User Context   06/09/2018 0209 06/10/2018 1121 DNR [id number]  Arta Silence, MD ED   05/15/2018 1120 05/18/2018 2303 Full Code [id number]  Dustin Flock, MD Inpatient   05/15/2018 0125 05/15/2018 1120 Full Code [id number]  Amelia Jo, MD Inpatient    Advance Directive Documentation     Most Recent Value  Type of Advance Directive  Healthcare Power of Attorney  Pre-existing out of facility DNR order (yellow form or pink MOST form)  -  "MOST" Form in Place?  -     Disposition Plan: Will not be able to get PEG tube until next week  Antibiotics:  Unasyn  Time spent: 25 minutes.  Spoke with fianc on the phone today.  Sister did not pick up the phone when I called her.  Kalene Cutler [facility]

## 2018-06-23 NOTE — Progress Notes (Signed)
Attempted to call Harlene Ramus (son) to give update on patient status and obtain consent for blood. No answer. Message left to call hospital.

## 2018-06-23 NOTE — Progress Notes (Signed)
PHARMACIST - PHYSICIAN COMMUNICATION  CONCERNING: IV to Oral Route Change Policy  RECOMMENDATION: This patient is receiving famotidine by the intravenous route.  Based on criteria approved by the Pharmacy and Therapeutics Committee, the intravenous medication(s) is/are being converted to the equivalent oral dose form(s).   DESCRIPTION: These criteria include:  The patient is eating (either orally or via tube) and/or has been taking other orally administered medications for a least 24 hours  The patient has no evidence of active gastrointestinal bleeding or impaired GI absorption (gastrectomy, short bowel, patient on TNA or NPO).  If you have questions about this conversion, please contact the Pharmacy Department  []   [phone] )  [facility] [x]   [phone] )  [facility] []   [phone] )  [facility] []   [phone] )  [facility] []   [phone] )  [facility]   Simpson,Michael L, [facility] 06/23/2018 9:44 AM

## 2018-06-23 NOTE — Progress Notes (Signed)
I spoke with pts son Johnte Portnoy who is the decision maker regarding pts plan of care.  He stated the pt has had a poor quality of life and he no longer wants his father to suffer.  He is leaning towards transitioning pt to comfort measures only, and is adamant he does NOT want his father to have anymore procedures.  He would like his father to receive blood products for now and would like [place] to proceed with a SBT trial today while he determines if he will be able to travel from [place] to see his father.  He stated he would call me later today to get an update.   Sonda Rumble, AGNP  Pulmonary/Critical Care Pager [phone] (please enter 7 digits) PCCM Consult Pager [phone] (please enter 7 digits)

## 2018-06-23 NOTE — Consult Note (Signed)
Pharmacy Antibiotic Note  Alexander Lara is a 72 y.o. male admitted on 05/18/2018 with pneumonia.  Pharmacy has been consulted for Zosyn dosing. Admitted with on 9/25 for UTI and PNA . Patient underwent EGD on 10/8. Patient had hypoxia after procedure; O2 sats in the 60s and was transferred to ICU.[name] Abdominal x-ray shows large pneumoperitoneum - exploratory laparotomy 10/9. Potential upper GI series tomorrow for gastric repair.  Plan: Continue Zosyn 3.375g IV q8h (4 hour infusion).  Height: 5\' 11"  (180.3 cm) Weight: 78 lb 14.8 oz (35.8 kg) IBW/kg (Calculated) : 75.3  Temp (24hrs), Avg:97.6 F (36.4 C), Min:95.5 F (35.3 C), Max:99.5 F (37.5 C)  Recent Labs  Lab 06/18/18 0426 06/19/18 1859 06/20/18 0453 06/20/2018 0426 07/02/2018 2002 06/18/2018 2049 06/22/18 0122 06/22/18 0442 06/23/18 0357  WBC 8.2  --  11.9*  --  11.5*  --   --  15.1* 7.9  CREATININE 0.63 0.58*  --  0.60* 0.51*  --   --  0.63 0.63  LATICACIDVEN  --   --   --   --   --  1.9 1.0  --   --     Estimated Creatinine Clearance: 41.6 mL/min (by C-G formula based on SCr of 0.63 mg/dL).    Allergies  Allergen Reactions  . Penicillins     Antimicrobials this admission: 10/9 Zosyn >> 10/7 Augmentin >> 10/8 9/30 Unasyn >>10/8 9/28 Flagyl >> 09/30 9/26 Ceftriaxone >> 9/30   Microbiology results: 9/25 BCx: Proteus - pansensitive  9/25 UCx: Proteus mirabilis resistant to Macrobid  10/8 Sputum: abundant WBCs  10/8 MRSA PCR: (-)  Thank you for allowing pharmacy to be a part of this patient's care.   Mauri Reading, PharmD Pharmacy Resident  06/23/2018 1:07 PM

## 2018-06-23 NOTE — Progress Notes (Addendum)
Daily Progress Note   Patient Name: Alexander Lara       Date: 06/23/2018 DOB: 28-Jun-1946  Age: 72 y.o. MRN#: [id number] Attending Physician: Dustin Flock, MD Primary Care Physician: [facility], [facility] Admit Date: 05/26/2018  Reason for Consultation/Follow-up: Establishing goals of care  Subjective:  Patient is resting in bed on a ventilator. PEG placed with subsequent ex lap and gastric repair. Receiving blood. Attempted to reach son [name] without success. Per nursing they have been unable to reach the son. Spoke with sister Silva Bandy who states she had made the decision to not place the PEG tube. She states Judeen Hammans, his girlfriend did not agree with that plan and called his estranged son [name] to have him override her. She states Judeen Hammans has asserted herself in the decision making to the point she has backed out. Inquired if she still would like to be the decision maker behind son [name] or if she wanted Judeen Hammans to be the decision maker, and she was unsure. Inquired if she would be willing to come in for a family meeting with Judeen Hammans if she would like, and [name] in person or by phone. She agreed she would come if needed.   Spoke with Judeen Hammans and she states she wants everything done. She states she will reach out to [name] to have him call the hospital.    Beatrix Fetters called. [name] states he has been going through very stressful life events and has not been able to come. He states he has 2 other brothers. Shared with him that the 3 of them are the decision makers as there is no POA paperwork. Discussed myself speaking with the other 2 brothers as well to discuss situation and their feelings.  He states he will keep them updated as neither want anything to do with him. He states he and his father have made amends. He  states he has seen his father twice this year. He states he could have cried when he saw him earlier this year because of his thinness and fraility. He feels that his father is suffering and does not want to keep putting him through all this. He does not want his father to have any further procedures or escalation in care. He states he is aware of [name]'s and [name]'s feelings and he will make the decisions  based on his father's best interest. He states he is concerned about finances as money has gone missing from his father's account, and concerned about insurance policies as he questions if there is a Museum/gallery curator motive for Judeen Hammans keeping his father alive. He is concerned with final arranagements.  CCM NP Hinton Dyer spoke with [name] as well. Plans to give him blood and attempt to extubate him today. Son will attempt to get here.    Length of Stay: 14  Current Medications: Scheduled Meds:  . chlorhexidine gluconate (MEDLINE KIT)  15 mL Mouth Rinse BID  . famotidine  20 mg Per Tube QHS  . feeding supplement (VITAL 1.5 CAL)  1,000 mL Per Tube Q24H  . insulin aspart  0-9 Units Subcutaneous Q4H  . ipratropium-albuterol  3 mL Nebulization Q6H  . mouth rinse  15 mL Mouth Rinse 10 times per day  . sodium chloride flush  10-40 mL Intracatheter Q12H    Continuous Infusions: . sodium chloride 0 mL/hr at 06/20/18 0407  . dexmedetomidine (PRECEDEX) IV infusion 1 mcg/kg/hr (06/23/18 1000)  . dextrose 5% lactated ringers with KCl 20 mEq/L 125 mL/hr at 06/23/18 1000  . levETIRAcetam Stopped (06/23/18 0542)  . norepinephrine (LEVOPHED) Adult infusion Stopped (06/23/18 0522)  . piperacillin-tazobactam (ZOSYN)  IV Stopped (06/23/18 0920)  . potassium chloride 100 mL/hr at 06/23/18 1000    PRN Meds: sodium chloride, acetaminophen, albuterol, fentaNYL (SUBLIMAZE) injection, metoprolol tartrate, midazolam, [DISCONTINUED] ondansetron **OR** ondansetron (ZOFRAN) IV, polyvinyl alcohol, sodium chloride, sodium chloride  flush  Physical Exam  Constitutional: No distress.  Pulmonary/Chest:  Intubated            Vital Signs: BP 107/63   Pulse 92   Temp (!) 95.5 F (35.3 C)   Resp 11   Ht _0  (1.803 m)   Wt 35.8 kg   SpO2 100%   BMI 11.01 kg/m  SpO2: SpO2: 100 % O2 Device: O2 Device: Ventilator O2 Flow Rate: O2 Flow Rate (L/min): 14 L/min  Intake/output summary:   Intake/Output Summary (Last 24 hours) at 06/23/2018 1028 Last data filed at 06/23/2018 1000 Gross per 24 hour  Intake 5249.21 ml  Output 400 ml  Net 4849.21 ml   LBM: Last BM Date: 06/20/18 Baseline Weight: Weight: 37.5 kg Most recent weight: Weight: 35.8 kg       Palliative Assessment/Data:    Flowsheet Rows     Most Recent Value  Intake Tab  Referral Department  Hospitalist  Unit at Time of Referral  Cardiac/Telemetry Unit  Palliative Care Primary Diagnosis  Sepsis/Infectious Disease  Date Notified  06/09/18  Palliative Care Type  New Palliative care  Reason for referral  Clarify Goals of Care, Counsel Regarding Hospice  Date of Admission  05/29/2018  Date first seen by Palliative Care  06/09/18  # of days Palliative referral response time  0 Day(s)  # of days IP prior to Palliative referral  1  Clinical Assessment  Psychosocial & Spiritual Assessment  Palliative Care Outcomes      Patient Active Problem List   Diagnosis Date Noted  . Pressure injury of skin 06/18/2018  . Recurrent UTI (urinary tract infection) 06/09/2018  . Adult failure to thrive   . Palliative care by specialist   . Severe protein-calorie malnutrition ([place]) 05/17/2018  . Sepsis ([place]) 05/14/2018    Palliative Care Assessment & Plan    Recommendations/Plan:  Continue care today. Son is trying to come from [place]. Leaning toward comfort care.    Code  Status:    Code Status Orders  (From admission, onward)         Start     Ordered   06/10/18 1120  Do not attempt resuscitation (DNR)  Continuous    Question Answer  Comment  In the event of cardiac or respiratory ARREST Do not call a "code blue"   In the event of cardiac or respiratory ARREST Do not perform Intubation, CPR, defibrillation or ACLS   In the event of cardiac or respiratory ARREST Use medication by any route, position, wound care, and other measures to relive pain and suffering. May use oxygen, suction and manual treatment of airway obstruction as needed for comfort.      06/10/18 1121        Code Status History    Date Active Date Inactive Code Status Order ID Comments User Context   06/09/2018 0209 06/10/2018 1121 DNR [id number]  Arta Silence, MD ED   05/15/2018 1120 05/18/2018 2303 Full Code [id number]  Dustin Flock, MD Inpatient   05/15/2018 0125 05/15/2018 1120 Full Code [id number]  Amelia Jo, MD Inpatient    Advance Directive Documentation     Most Recent Value  Type of Advance Directive  Healthcare Power of Attorney  Pre-existing out of facility DNR order (yellow form or pink MOST form)  -  "MOST" Form in Place?  -       Prognosis:   Very poor.   Discharge Planning:  To Be Determined  Care plan was discussed with CCM  Thank you for allowing the Palliative Medicine Team to assist in the care of this patient.   Time In: 9:50 Time Out: 11:15 Total Time 1 hour 25 minutes Prolonged Time Billed  yes      Greater than 50%  of this time was spent counseling and coordinating care related to the above assessment and plan.  Asencion Gowda, NP  Please contact Palliative Medicine Team phone at [phone] for questions and concerns.

## 2018-06-23 NOTE — Consult Note (Signed)
Pharmacy Electrolyte Monitoring Consult:  Pharmacy consulted to assist in monitoring and replacing electrolytes in this 72 y.o. male admitted on 05/27/2018 with Urinary Retention   Labs:  Sodium (mmol/L)  Date Value  06/23/2018 135   Potassium (mmol/L)  Date Value  06/23/2018 4.3   Magnesium (mg/dL)  Date Value  29/56/2130 2.1   Phosphorus (mg/dL)  Date Value  86/57/8469 2.9   Calcium (mg/dL)  Date Value  62/95/2841 7.3 (L)   Albumin (g/dL)  Date Value  32/44/0102 1.6 (L)   Corrected Ca = 8.82  Assessment/Plan: Goal Mg > 2.0 due to history of seizures. Per conversation with NP, transitioned from D5/LR with 20 mEq KCL to D5W/NS with KCl  20 mEq/L @ 125 mL/h and received KCl 10 mEq x 4. Repeat K > 4, no need for further replacement. Magnesium replacement not warranted at this time.   Pharmacy will continue to monitor.   Mauri Reading, PharmD Pharmacy Resident  06/23/2018 1:09 PM

## 2018-06-23 NOTE — Progress Notes (Signed)
Contacted Alexander Lara to ask for her assistance in getting in with son.  [name] updated via phone and verbalized that she would try and get in touch with son.

## 2018-06-23 NOTE — Progress Notes (Signed)
Patient awake and doing well with breathing trail.  Vitals are stable.  NP at bedside and has given order extubate patient.

## 2018-06-23 NOTE — Progress Notes (Signed)
[name] at bedside.

## 2018-06-24 ENCOUNTER — Inpatient Hospital Stay: Payer: Medicare Other

## 2018-06-24 LAB — CBC
HEMATOCRIT: 31.1 % — AB (ref 39.0–52.0)
HEMOGLOBIN: 10.3 g/dL — AB (ref 13.0–17.0)
MCH: 26.9 pg (ref 26.0–34.0)
MCHC: 33.1 g/dL (ref 30.0–36.0)
MCV: 81.2 fL (ref 80.0–100.0)
NRBC: 0 % (ref 0.0–0.2)
Platelets: 212 10*3/uL (ref 150–400)
RBC: 3.83 MIL/uL — AB (ref 4.22–5.81)
RDW: 19.1 % — ABNORMAL HIGH (ref 11.5–15.5)
WBC: 8.7 10*3/uL (ref 4.0–10.5)

## 2018-06-24 LAB — TYPE AND SCREEN
ABO/RH(D): A POS
Antibody Screen: NEGATIVE
UNIT DIVISION: 0
Unit division: 0

## 2018-06-24 LAB — GLUCOSE, CAPILLARY
GLUCOSE-CAPILLARY: 148 mg/dL — AB (ref 70–99)
Glucose-Capillary: 142 mg/dL — ABNORMAL HIGH (ref 70–99)
Glucose-Capillary: 139 mg/dL — ABNORMAL HIGH (ref 70–99)
Glucose-Capillary: 140 mg/dL — ABNORMAL HIGH (ref 70–99)
Glucose-Capillary: 138 mg/dL — ABNORMAL HIGH (ref 70–99)
Glucose-Capillary: 130 mg/dL — ABNORMAL HIGH (ref 70–99)

## 2018-06-24 LAB — BASIC METABOLIC PANEL
ANION GAP: 6 (ref 5–15)
BUN: 10 mg/dL (ref 8–23)
CO2: 18 mmol/L — AB (ref 22–32)
Calcium: 7.3 mg/dL — ABNORMAL LOW (ref 8.9–10.3)
Chloride: 114 mmol/L — ABNORMAL HIGH (ref 98–111)
Creatinine, Ser: 0.56 mg/dL — ABNORMAL LOW (ref 0.61–1.24)
GFR calc Af Amer: >60 mL/min (ref >60)
GFR calc non Af Amer: >60 mL/min (ref >60)
GLUCOSE: 158 mg/dL — AB (ref 70–99)
Potassium: 4 mmol/L (ref 3.5–5.1)
Sodium: 138 mmol/L (ref 135–145)

## 2018-06-24 LAB — CULTURE, RESPIRATORY

## 2018-06-24 LAB — BPAM RBC
BLOOD PRODUCT EXPIRATION DATE: 201911052359
Blood Product Expiration Date: 201911052359
ISSUE DATE / TIME: 201910101219
ISSUE DATE / TIME: 201910101559
UNIT TYPE AND RH: 6200
Unit Type and Rh: 6200

## 2018-06-24 LAB — MAGNESIUM: Magnesium: 1.8 mg/dL (ref 1.7–2.4)

## 2018-06-24 LAB — CULTURE, RESPIRATORY W GRAM STAIN

## 2018-06-24 LAB — PHOSPHORUS: Phosphorus: 1.6 mg/dL — ABNORMAL LOW (ref 2.5–4.6)

## 2018-06-24 MED ORDER — INSULIN ASPART 100 UNIT/ML ~~LOC~~ SOLN
SUBCUTANEOUS | Status: AC
  Filled 2018-06-24: qty 1

## 2018-06-24 MED ORDER — JEVITY 1.5 CAL/FIBER PO LIQD
1000.0000 mL | ORAL | Status: DC
  Administered 2018-06-24 – 2018-06-26 (×2): 1000 mL

## 2018-06-24 MED ORDER — CHLORHEXIDINE GLUCONATE 0.12 % MT SOLN
OROMUCOSAL | Status: AC
  Administered 2018-06-24: 13:00:00
  Filled 2018-06-24: qty 15

## 2018-06-24 MED ORDER — SODIUM CHLORIDE 0.9 % IV SOLN
1.0000 g | Freq: Two times a day (BID) | INTRAVENOUS | Status: DC
  Administered 2018-06-24 – 2018-07-04 (×21): 1 g via INTRAVENOUS
  Filled 2018-06-24 (×27): qty 1

## 2018-06-24 MED ORDER — MAGNESIUM SULFATE 2 GM/50ML IV SOLN
2.0000 g | Freq: Once | INTRAVENOUS | Status: AC
  Administered 2018-06-24: 2 g via INTRAVENOUS
  Filled 2018-06-24: qty 50

## 2018-06-24 MED ORDER — DEXAMETHASONE SODIUM PHOSPHATE 4 MG/ML IJ SOLN
4.0000 mg | Freq: Three times a day (TID) | INTRAMUSCULAR | Status: DC
  Administered 2018-06-24 – 2018-06-25 (×3): 4 mg via INTRAVENOUS
  Filled 2018-06-24 (×3): qty 1

## 2018-06-24 MED ORDER — VITAMIN B-1 100 MG PO TABS: 100.0000 mg | ORAL_TABLET | Freq: Every day | ORAL | Status: DC

## 2018-06-24 MED ORDER — SODIUM PHOSPHATES 45 MMOLE/15ML IV SOLN
30.0000 mmol | Freq: Once | INTRAVENOUS | Status: AC
  Administered 2018-06-24: 30 mmol via INTRAVENOUS
  Filled 2018-06-24: qty 10

## 2018-06-24 MED ORDER — VITAMIN B-1 100 MG PO TABS
100.0000 mg | ORAL_TABLET | Freq: Every day | ORAL | Status: AC
  Administered 2018-06-25 – 2018-06-28 (×4): 100 mg
  Filled 2018-06-24 (×4): qty 1

## 2018-06-24 MED ORDER — VITAMIN C 500 MG PO TABS
250.0000 mg | ORAL_TABLET | Freq: Two times a day (BID) | ORAL | Status: DC
  Administered 2018-06-25 – 2018-07-06 (×23): 250 mg
  Filled 2018-06-24: qty 0.5
  Filled 2018-06-24 (×3): qty 1
  Filled 2018-06-24 (×4): qty 0.5
  Filled 2018-06-24: qty 1
  Filled 2018-06-24: qty 0.5
  Filled 2018-06-24: qty 1
  Filled 2018-06-24 (×5): qty 0.5
  Filled 2018-06-24: qty 1
  Filled 2018-06-24: qty 0.5
  Filled 2018-06-24 (×2): qty 1
  Filled 2018-06-24: qty 0.5
  Filled 2018-06-24 (×3): qty 1

## 2018-06-24 MED ORDER — ADULT MULTIVITAMIN LIQUID CH
15.0000 mL | Freq: Every day | ORAL | Status: DC
  Administered 2018-06-25 – 2018-07-06 (×12): 15 mL
  Filled 2018-06-24 (×15): qty 15

## 2018-06-24 NOTE — Progress Notes (Signed)
Pt alert and oriented. VS within normal range. No pain, no anxiety. Shakes and nods. Weakness on left. Irregular HR. SaO2 >95%. BM X2, cleaned and changed. Sacral foam changed. Ultrasound found no DVT on upper right extremity. Left subclavian vein incompletely occlusive thrombus around PICC. On SCD. ABD dressing clean, dry and intact. PEG tube feeding with Jevity 1.5 at 60ml/hr. q6 free water at 60ml. Right wrist abrasion intervened by gauze dressing. CBG around 130, off novolog. Fiancee visited in the morning and afternoon. Will keep monitoring.

## 2018-06-24 NOTE — Progress Notes (Addendum)
Daily Progress Note   Patient Name: Alexander Lara       Date: 06/24/2018 DOB: Mar 05, 1946  Age: 72 y.o. MRN#: [id number] Attending Physician: Dustin Flock, MD Primary Care Physician: [facility] Admit Date: 05/26/2018  Reason for Consultation/Follow-up: Psychosocial/spiritual support  Subjective: Patient is resting in bed. He shakes his head that he is not having pain, and nods that he is feeling better. He does not speak, shake or nod to other questions. No family at bedside. Attempted unsucessfully to contact [name] to offer support and inquire again about contact information for his brothers.   Length of Stay: 15  Current Medications: Scheduled Meds:  . chlorhexidine gluconate (MEDLINE KIT)  15 mL Mouth Rinse BID  . dexamethasone  4 mg Intravenous Q8H  . free water  60 mL Per Tube Q4H  . insulin aspart      . ipratropium-albuterol  3 mL Nebulization Q6H  . mouth rinse  15 mL Mouth Rinse 10 times per day  . [START ON 06/25/2018] multivitamin  15 mL Per Tube Daily  . sodium chloride flush  10-40 mL Intracatheter Q12H  . [START ON 06/25/2018] thiamine  100 mg Per Tube Daily  . [START ON 06/25/2018] vitamin C  250 mg Per Tube BID    Continuous Infusions: . sodium chloride 250 mL (06/24/18 1637)  . dextrose 5 % and 0.9 % NaCl with KCl 20 mEq/L 125 mL/hr at 06/24/18 1600  . famotidine (PEPCID) IV Stopped (06/24/18 1047)  . feeding supplement (JEVITY 1.5 CAL/FIBER) 1,000 mL (06/24/18 1355)  . levETIRAcetam Stopped (06/24/18 0610)  . meropenem (MERREM) IV Stopped (06/24/18 1457)  . norepinephrine (LEVOPHED) Adult infusion Stopped (06/23/18 0522)    PRN Meds: sodium chloride, acetaminophen, albuterol, metoprolol tartrate, [DISCONTINUED] ondansetron **OR** ondansetron  (ZOFRAN) IV, polyvinyl alcohol, sodium chloride, sodium chloride flush  Physical Exam  Constitutional: No distress.  Pulmonary/Chest: Effort normal.  Neurological: He is alert.  Skin: Skin is warm and dry.            Vital Signs: BP (!) 161/98 (BP Location: Right Arm)   Pulse 83   Temp 99.1 F (37.3 C) (Rectal)   Resp 16   Ht '5\' 11"'  (1.803 m)   Wt 38.9 kg   SpO2 100%   BMI 11.96 kg/m  SpO2: SpO2: 100 %  O2 Device: O2 Device: Nasal Cannula O2 Flow Rate: O2 Flow Rate (L/min): 3 L/min  Intake/output summary:   Intake/Output Summary (Last 24 hours) at 06/24/2018 1731 Last data filed at 06/24/2018 1600 Gross per 24 hour  Intake 3483.75 ml  Output 2950 ml  Net 533.75 ml   LBM: Last BM Date: 06/24/18 Baseline Weight: Weight: 37.5 kg Most recent weight: Weight: 38.9 kg       Palliative Assessment/Data:    Flowsheet Rows     Most Recent Value  Intake Tab  Referral Department  Hospitalist  Unit at Time of Referral  Cardiac/Telemetry Unit  Palliative Care Primary Diagnosis  Sepsis/Infectious Disease  Date Notified  06/09/18  Palliative Care Type  New Palliative care  Reason for referral  Clarify Goals of Care, Counsel Regarding Hospice  Date of Admission  05/29/2018  Date first seen by Palliative Care  06/09/18  # of days Palliative referral response time  0 Day(s)  # of days IP prior to Palliative referral  1  Clinical Assessment  Psychosocial & Spiritual Assessment  Palliative Care Outcomes      Patient Active Problem List   Diagnosis Date Noted  . Pressure injury of skin 06/25/2018  . Recurrent UTI (urinary tract infection) 06/09/2018  . Adult failure to thrive   . Palliative care by specialist   . Severe protein-calorie malnutrition ([place]) 05/17/2018  . Sepsis ([place]) 05/14/2018    Palliative Care Assessment & Plan    Recommendations/Plan:  Continue care. Son Chriss Czar is planning to arrive "Sunday, palliative medicine service will be unavailable on the weekend.  [name] has 2 brothers. Recommend attempting to speak with the brothers to offer decision making capacity if they would like. Attempted unsuccessfully to call and obtain the other 2 brother's contact information today.     Code Status:    Code Status Orders  (From admission, onward)         Start     Ordered   06/24/18 0440  Limited resuscitation (code)  Continuous    Question Answer Comment  In the event of cardiac or respiratory ARREST: Initiate Code Blue, Call Rapid Response No   In the event of cardiac or respiratory ARREST: Perform CPR No   In the event of cardiac or respiratory ARREST: Perform Intubation/Mechanical Ventilation Yes   In the event of cardiac or respiratory ARREST: Use NIPPV/BiPAp only if indicated Yes   In the event of cardiac or respiratory ARREST: Administer ACLS medications if indicated No   In the event of cardiac or respiratory ARREST: Perform Defibrillation or Cardioversion if indicated No      10" /11/19 0439        Code Status History    Date Active Date Inactive Code Status Order ID Comments User Context   06/10/2018 1121 06/24/2018 0439 DNR [id number]  Asencion Gowda, NP Inpatient   06/09/2018 0209 06/10/2018 1121 DNR [id number]  Arta Silence, MD ED   05/15/2018 1120 05/18/2018 2303 Full Code [id number]  Dustin Flock, MD Inpatient   05/15/2018 0125 05/15/2018 1120 Full Code [id number]  Amelia Jo, MD Inpatient    Advance Directive Documentation     Most Recent Value  Type of Advance Directive  Healthcare Power of Attorney  Pre-existing out of facility DNR order (yellow form or pink MOST form)  -  "MOST" Form in Place?  -       Prognosis:   Unable to determine  Discharge Planning:  To Be Determined  Care plan was discussed with  Hinton Dyer NP with CCM  Thank you for allowing the Palliative Medicine Team to assist in the care of this patient.   Total Time 15 min Prolonged Time Billed no      Greater than 50%  of this time was spent counseling  and coordinating care related to the above assessment and plan.  Asencion Gowda, NP  Please contact Palliative Medicine Team phone at [phone] for questions and concerns.

## 2018-06-24 NOTE — Progress Notes (Signed)
SURGICAL PROGRESS NOTE   Hospital Day(s): 15.   Post op day(s): 3 Days Post-Op.   Interval History: Patient seen and examined today. Found extubated, no respiratory distress. Awake, alert, but not talkative.   Vital signs in last 24 hours: [min-max] current  Temp:  [95.5 F (35.3 C)-99.3 F (37.4 C)] 99.3 F (37.4 C) (10/11 0900) Pulse Rate:  [50-131] 114 (10/11 0900) Resp:  [11-22] 21 (10/11 0900) BP: (98-178)/(61-101) 146/67 (10/11 0900) SpO2:  [91 %-100 %] 97 % (10/11 0900) FiO2 (%):  [35 %-40 %] 35 % (10/10 1518) Weight:  [38.9 kg] 38.9 kg (10/11 0326)     Height: 5\' 11"  (180.3 cm) Weight: 38.9 kg BMI (Calculated): 11.97    Physical Exam:  Constitutional: alert, no respiratory distress Gastrointestinal: soft, non-tender, and non-distended. Gastrostomy in place. Dressing dry and clean.   Labs:  CBC Latest Ref Rng & Units 06/24/2018 06/23/2018 06/23/2018  WBC 4.0 - 10.5 K/uL 8.7 - 7.9  Hemoglobin 13.0 - 17.0 g/dL 10.3(L) 9.9(L) 5.6(L)  Hematocrit 39.0 - 52.0 % 31.1(L) 29.3(L) 17.5(L)  Platelets 150 - 400 K/uL 212 - 216   CMP Latest Ref Rng & Units 06/24/2018 06/23/2018 06/23/2018  Glucose 70 - 99 mg/dL 161(W) - 960(A)  BUN 8 - 23 mg/dL 10 - 11  Creatinine 5.40 - 1.24 mg/dL 9.81(X) - 9.14  Sodium 135 - 145 mmol/L 138 - 135  Potassium 3.5 - 5.1 mmol/L 4.0 4.3 3.0(L)  Chloride 98 - 111 mmol/L 114(H) - 111  CO2 22 - 32 mmol/L 18(L) - 18(L)  Calcium 8.9 - 10.3 mg/dL 7.3(L) - 7.3(L)  Total Protein 6.5 - 8.1 g/dL - - -  Total Bilirubin 0.3 - 1.2 mg/dL - - -  Alkaline Phos 38 - 126 U/L - - -  AST 15 - 41 U/L - - -  ALT 0 - 44 U/L - - -    Imaging studies:  EXAM: WATER SOLUBLE UPPER GI SERIES  TECHNIQUE: Single-column upper GI series was performed using water soluble contrast.  CONTRAST:  ISOVUE-300 IOPAMIDOL (ISOVUE-300) INJECTION 61%  COMPARISON:  None  FLUOROSCOPY TIME:  Fluoroscopy Time:  3 minutes 18 seconds  Radiation Exposure Index (if  provided by the fluoroscopic device): 22.7 mGy  Number of Acquired Spot Images: 0  FINDINGS: Real-time fluoroscopy was utilized for evaluation of the stomach. Isovue 300 contrast was hand injected through an existing nasogastric tube opacifying the stomach. Balloon from the PEG tube is delineated within the stomach. Patient was position in the right lateral decubitus position for contrast to flow into the body and antrum of the stomach. No extraluminal contrast is identified to suggest persistent perforation.  IMPRESSION: No extraluminal contrast to suggest active gastric perforation.   Electronically Signed   By: Elige Ko   On: 06/22/2018 13:10  Assessment/Plan:  72 y.o. male with gastric laceration 3 Day Post-Op s/p gastrorrhaphy and gastrostomy. Patient extubated yesterday and tolerating. Also started on tube feedings and has been progressed to 35 mL/hr. Tolerating well. Had bowel movement. Agree with diet. Just as a recommendation, watch for refeeding syndrome is this severe malnourished patient after initiation of feedings.  Today patient with worsening hypophosphatemia which is one of the classic features of refeeding syndrome. Consider giving thiamine.  Gae Gallop, MD

## 2018-06-24 NOTE — Progress Notes (Signed)
Pt had 2nd BM with loose bowels in day shift. Cleaned and changed. Rectal thermometer back in. Barrier cream on. Sacral foam changed.

## 2018-06-24 NOTE — Progress Notes (Signed)
Patient ID: Alexander Lara, male   DOB: April 24, 1946, 72 y.o.   MRN: [id number]   [facility] PROGRESS NOTE  Alexander Lara ZRA:[id number] DOB: 1945/12/13 DOA: 05/29/2018 PCP: [facility]  HPI/Subjective: Patient extubated patient is eye open but does not communicate tolerating his tube feeds Objective: Vitals:   06/24/18 1300 06/24/18 1400  BP: (!) 210/99 (!) 159/77  Pulse: (!) 40 95  Resp: (!) 28 16  Temp: 98.8 F (37.1 C) 98.6 F (37 C)  SpO2: 97% 97%    Filed Weights   06/22/18 0430 06/23/18 0441 06/24/18 0326  Weight: 35.6 kg 35.8 kg 38.9 kg    ROS: Review of Systems  Unable to perform ROS: Acuity of condition   Exam: Physical Exam  Constitutional: He appears cachectic.  HENT:  Nose: No mucosal edema.  Mouth/Throat: No oropharyngeal exudate or posterior oropharyngeal edema.  Eyes: Pupils are equal, round, and reactive to light. Conjunctivae and lids are normal.  Neck: No JVD present. Carotid bruit is not present. No edema present. No thyroid mass and no thyromegaly present.  Cardiovascular: S1 normal and S2 normal. An irregularly irregular rhythm present. Exam reveals no gallop.  No murmur heard. Pulses:      Dorsalis pedis pulses are 2+ on the right side, and 2+ on the left side.  Respiratory: No respiratory distress. He has no wheezes. He has no rhonchi. He has no rales.  GI: Soft. Bowel sounds are normal. There is no tenderness.  Musculoskeletal:       Right ankle: He exhibits no swelling.       Left ankle: He exhibits no swelling.  Lymphadenopathy:    He has no cervical adenopathy.  Neurological: He is unresponsive.  Skin: Skin is warm. No rash noted. Nails show no clubbing.      Data Reviewed: Basic Metabolic Panel: Recent Labs  Lab 06/19/18 0514  06/20/18 0453 07/04/2018 0426 06/27/2018 2002 06/22/18 0442 06/23/18 0357 06/23/18 1202 06/24/18 0339  NA  --    < >  --  137 133* 137 135  --  138  K 3.7   < > 3.6 3.8 3.9 3.8 3.0* 4.3 4.0   CL  --    < >  --  110 106 112* 111  --  114*  CO2  --    < >  --  22 19* 20* 18*  --  18*  GLUCOSE  --    < >  --  93 164* 169* 161*  --  158*  BUN  --    < >  --  _0 --  10  CREATININE  --    < >  --  0.60* 0.51* 0.63 0.63  --  0.56*  CALCIUM  --    < >  --  7.5* 7.5* 7.3* 7.3*  --  7.3*  MG 1.7  --  1.8 2.0  --  1.8 2.1  --  1.8  PHOS 1.8*  --  2.5 2.5  --  2.9  --   --  1.6*   < > = values in this interval not displayed.   CBC: Recent Labs  Lab 06/20/18 0453 07/08/2018 2002 06/22/18 0442 06/23/18 0357 06/23/18 2100 06/24/18 0339  WBC 11.9* 11.5* 15.1* 7.9  --  8.7  NEUTROABS  --   --   --  6.8  --   --   HGB 8.4* 7.9* 7.4* 5.6* 9.9* 10.3*  HCT 25.7* 25.0* 22.9*  17.5* 29.3* 31.1*  MCV 79.2* 80.9 80.6 81.4  --  81.2  PLT 225 258 261 216  --  212   Cardiac Enzymes:   Recent Results (from the past 240 hour(s))  MRSA PCR Screening     Status: None   Collection Time: 07/13/2018 11:46 AM  Result Value Ref Range Status   MRSA by PCR NEGATIVE NEGATIVE Final    Comment:        The GeneXpert MRSA Assay (FDA approved for NASAL specimens only), is one component of a comprehensive MRSA colonization surveillance program. It is not intended to diagnose MRSA infection nor to guide or monitor treatment for MRSA infections. Performed at [facility], [address] [place], [place] [address]   Culture, respiratory (non-expectorated)     Status: None   Collection Time: 06/14/2018  6:28 PM  Result Value Ref Range Status   Specimen Description   Final    TRACHEAL ASPIRATE Performed at [facility], [address] [place], [place] [address]    Special Requests   Final    NONE Performed at [facility], [address] [place], [place] [address]    Gram Stain   Final    ABUNDANT WBC PRESENT, PREDOMINANTLY PMN ABUNDANT SQUAMOUS EPITHELIAL CELLS PRESENT NO ORGANISMS SEEN Performed at [facility], [place] [address], [place], [place]  [address]    Culture RARE CANDIDA ALBICANS  Final   Report Status 06/24/2018 FINAL  Final     Studies: US Venous Img Upper Uni Right  Result Date: 06/24/2018 CLINICAL DATA:  Edema EXAM: RIGHT UPPER EXTREMITY VENOUS DOPPLER ULTRASOUND TECHNIQUE: Gray-scale sonography with graded compression, as well as color Doppler and duplex ultrasound were performed to evaluate the upper extremity deep venous system from the level of the subclavian vein and including the jugular, axillary, basilic and upper cephalic vein. Spectral Doppler was utilized to evaluate flow at rest and with distal augmentation maneuvers. COMPARISON:  None. FINDINGS: Thrombus within deep veins:  None visualized. Compressibility of deep veins:  Normal. Duplex waveform respiratory phasicity:  Normal. Duplex waveform response to augmentation:  Normal. Venous reflux:  None visualized. Other findings: On routine limited images of the contralateral left subclavian vein, incompletely occlusive thrombus is noted around PICC line. IMPRESSION: 1. Negative for right upper extremity DVT. 2. POSITIVE for nonocclusive DVT in the left subclavian vein adjacent to PICC catheter. Electronically Signed   By: Lucrezia Europe M.D.   On: 06/24/2018 12:28   Dg Chest Port 1 View  Result Date: 06/23/2018 CLINICAL DATA:  Ventilated patient.  History of emphysema. EXAM: PORTABLE CHEST 1 VIEW COMPARISON:  06/22/2018 FINDINGS: The enteric catheter has been removed. Left PICC line and endotracheal tube in stable position. Cardiomediastinal silhouette is normal. Mediastinal contours appear intact. Calcific atherosclerotic disease of the aorta. There is no evidence of pleural effusion or pneumothorax. Focal airspace consolidation the left lower lobe, more prominent than on the prior radiograph. Scattered peribronchial airspace consolidation in the right lower lobe. Baseline emphysematous changes. Osseous structures are without acute abnormality. Decreased volume of known  pneumoperitoneum. IMPRESSION: Worsening of left lower thorax airspace consolidation. Stable appearance of patchy peribronchial right lower lobe airspace consolidation. Decreased volume pneumoperitoneum. Electronically Signed   By: Fidela Salisbury M.D.   On: 06/23/2018 11:31   [place] Ekg Site Rite  Result Date: 06/23/2018 If Site Rite image not attached, placement could not be confirmed due to current cardiac rhythm.   Scheduled Meds: . chlorhexidine gluconate (  MEDLINE KIT)  15 mL Mouth Rinse BID  . dexamethasone  4 mg Intravenous Q8H  . free water  60 mL Per Tube Q4H  . insulin aspart      . ipratropium-albuterol  3 mL Nebulization Q6H  . mouth rinse  15 mL Mouth Rinse 10 times per day  . [START ON 06/25/2018] multivitamin  15 mL Per Tube Daily  . sodium chloride flush  10-40 mL Intracatheter Q12H  . [START ON 06/25/2018] thiamine  100 mg Per Tube Daily  . [START ON 06/25/2018] vitamin C  250 mg Per Tube BID   Continuous Infusions: . sodium chloride 0 mL/hr at 06/20/18 0407  . dextrose 5 % and 0.9 % NaCl with KCl 20 mEq/L 125 mL/hr at 06/24/18 1209  . famotidine (PEPCID) IV 20 mg (06/24/18 1020)  . feeding supplement (JEVITY 1.5 CAL/FIBER) 1,000 mL (06/24/18 1355)  . levETIRAcetam Stopped (06/24/18 0610)  . meropenem (MERREM) IV 1 g (06/24/18 1420)  . norepinephrine (LEVOPHED) Adult infusion Stopped (06/23/18 0522)  . sodium phosphate  Dextrose 5% IVPB 30 mmol (06/24/18 1102)    Assessment/Plan:   1. Acute respiratory failure post surgery extubated continue oxygen for now 2. status post gastric laceration status post gastrorrhaphy and gastrostomy.  Tolerating tube feeds 3.  Proteus sepsis from indwelling Foley catheter.  Blood cultures and urine cultures positive.  Foley catheter changed.  This will have to be changed every 4 weeks.  Continue zosyn 4. Clostridium species also and blood culture.  Continue IV zosyn which would cover Clostridium species also.  Family decided against  colonoscopy at this point. 5. Failed swallow evaluation.  Status post surgery for gastric tube 6. chronic aspiration pneumonia contineu abx 7. Atrial fibrillation with rapid ventricular response.  Hr stable 8. History of seizures on Keppra 9. Failure to thrive, cachexia and malnutrition.  Overall prognosis is poor.  Patient is a DNR.  Now his condition is even worsened post procedure 10. BPH.  meds on hold   Code Status:     Code Status Orders  (From admission, onward)         Start     Ordered   06/10/18 1120  Do not attempt resuscitation (DNR)  Continuous    Question Answer Comment  In the event of cardiac or respiratory ARREST Do not call a "code blue"   In the event of cardiac or respiratory ARREST Do not perform Intubation, CPR, defibrillation or ACLS   In the event of cardiac or respiratory ARREST Use medication by any route, position, wound care, and other measures to relive pain and suffering. May use oxygen, suction and manual treatment of airway obstruction as needed for comfort.      06/10/18 1121        Code Status History    Date Active Date Inactive Code Status Order ID Comments User Context   06/09/2018 0209 06/10/2018 1121 DNR [id number]  Arta Silence, MD ED   05/15/2018 1120 05/18/2018 2303 Full Code [id number]  Dustin Flock, MD Inpatient   05/15/2018 0125 05/15/2018 1120 Full Code [id number]  Amelia Jo, MD Inpatient    Advance Directive Documentation     Most Recent Value  Type of Advance Directive  Healthcare Power of Attorney  Pre-existing out of facility DNR order (yellow form or pink MOST form)  -  "MOST" Form in Place?  -     Disposition Plan: Will not be able to get PEG tube until next week  Antibiotics:  Unasyn  Time spent: 25 minutes.  Spoke with fianc on the phone today.  Sister did not pick up the phone when I called her.  Samiel Peel [facility]

## 2018-06-24 NOTE — Consult Note (Addendum)
Pharmacy Electrolyte Monitoring Consult:  Pharmacy consulted to assist in monitoring and replacing electrolytes in this 72 y.o. male admitted on July 02, 2018 with Urinary Retention   Labs:  Sodium (mmol/L)  Date Value  06/24/2018 138   Potassium (mmol/L)  Date Value  06/24/2018 4.0   Magnesium (mg/dL)  Date Value  16/06/9603 1.8   Phosphorus (mg/dL)  Date Value  54/05/8118 1.6 (L)   Calcium (mg/dL)  Date Value  14/78/2956 7.3 (L)   Albumin (g/dL)  Date Value  21/30/8657 1.6 (L)    Assessment/Plan: Goal Mg > 2.0 due to history of seizures. Goal potassium ~4.0. Spoke with dietary, will monitor all electrolytes (BMP/Mg/Phos) x 3 days due to risk of refeeding syndrome.  D5W/NS with KCl 20 mEq/L @ 125 mL/h is continued.  Magnesium was 1.8 today, so patient received magnesium IV 2g x1.  Phosphorous was 1.6, so patient received Sodium Phosphate in D5W 30 mmol x1.   Pharmacy will continue to monitor.  Alexander Lara, PharmD Candidate  06/24/2018 1:04 PM

## 2018-06-24 NOTE — Progress Notes (Addendum)
Pt had BM and active bowel sound. Rectal thermometer out. Cleaned and changed. Thermometer back in.

## 2018-06-24 NOTE — Progress Notes (Addendum)
CRITICAL CARE NOTE  CC  follow up respiratory failure  SUBJECTIVE Patient remains critically ill. He was extubated yesterday and has done ok He was given 2 units of blood . No obvious source of bleeding Prognosis is guarded, appears comfortable. Denies any pain. Very weak cough     SIGNIFICANT EVENTS Low BS required d50     BP (!) 147/93   Pulse 75   Temp 98.6 F (37 C)   Resp 20   Ht '5\' 11"'  (1.803 m)   Wt 38.9 kg   SpO2 91%   BMI 11.96 kg/m    REVIEW OF SYSTEMS  PATIENT IS UNABLE TO PROVIDE COMPLETE REVIEW OF SYSTEMS DUE TO SEVERE CRITICAL ILLNESS   PHYSICAL EXAMINATION:  GENERAL:critically ill appearing, no resp distress, very weak cough, very malnourished HEAD: Normocephalic, atraumatic.  EYES: Pupils equal, round, reactive to light.  No scleral icterus.  MOUTH: Moist mucosal membrane.swollen left lower lip NECK: Supple. No thyromegaly. No nodules. No JVD.  PULMONARY: +rhonchi, bilaterally diffuse CARDIOVASCULAR: S1 and S2.tachycardic. No murmurs, rubs, or gallops.  GASTROINTESTINAL: Soft,scaphoid tender around the G tube. No masses. Positive bowel sounds. No hepatosplenomegaly.  MUSCULOSKELETAL: No swelling, clubbing, or edema.  NEUROLOGIC: awake and alert follow simple commands SKIN:intact,warm,dry  INTAKE/OUTPUT  Intake/Output Summary (Last 24 hours) at 06/24/2018 0739 Last data filed at 06/24/2018 0700 Gross per 24 hour  Intake 4443.24 ml  Output 2650 ml  Net 1793.24 ml    LABS  CBC Recent Labs  Lab 06/22/18 0442 06/23/18 0357 06/23/18 2100 06/24/18 0339  WBC 15.1* 7.9  --  8.7  HGB 7.4* 5.6* 9.9* 10.3*  HCT 22.9* 17.5* 29.3* 31.1*  PLT 261 216  --  212   Coag's Recent Labs  Lab 06/20/18 0453  INR 1.16   BMET Recent Labs  Lab 06/22/18 0442 06/23/18 0357 06/23/18 1202 06/24/18 0339  NA 137 135  --  138  K 3.8 3.0* 4.3 4.0  CL 112* 111  --  114*  CO2 20* 18*  --  18*  BUN 14 11  --  10  CREATININE 0.63 0.63  --  0.56*   GLUCOSE 169* 161*  --  158*   Electrolytes Recent Labs  Lab 07/09/2018 0426  06/22/18 0442 06/23/18 0357 06/24/18 0339  CALCIUM 7.5*   < > 7.3* 7.3* 7.3*  MG 2.0  --  1.8 2.1 1.8  PHOS 2.5  --  2.9  --  1.6*   < > = values in this interval not displayed.   Sepsis Markers Recent Labs  Lab 06/22/2018 2049 06/22/18 0122 06/22/18 0442 06/23/18 0357  LATICACIDVEN 1.9 1.0  --   --   PROCALCITON  --  0.17 0.58 0.97   ABG Recent Labs  Lab 06/29/2018 1632 06/22/18 0433  PHART 7.51* 7.46*  PCO2ART 22* 25*  PO2ART 40* 353*   Liver Enzymes Recent Labs  Lab 06/19/18 1859 06/23/18 1202  AST 18  --   ALT 15  --   ALKPHOS 45  --   BILITOT 0.4  --   ALBUMIN 2.1* 1.6*   Cardiac Enzymes Recent Labs  Lab 06/22/18 0442  TROPONINI <0.03   Glucose Recent Labs  Lab 06/23/18 1158 06/23/18 1535 06/23/18 1924 06/23/18 2331 06/24/18 0337 06/24/18 0714  GLUCAP 154* 139* 89 84 142* 139*     Recent Results (from the past 240 hour(s))  MRSA PCR Screening     Status: None   Collection Time: 07/14/2018 11:46 AM  Result Value  Ref Range Status   MRSA by PCR NEGATIVE NEGATIVE Final    Comment:        The GeneXpert MRSA Assay (FDA approved for NASAL specimens only), is one component of a comprehensive MRSA colonization surveillance program. It is not intended to diagnose MRSA infection nor to guide or monitor treatment for MRSA infections. Performed at [facility], [address] [place], [place] [address]   Culture, respiratory (non-expectorated)     Status: None (Preliminary result)   Collection Time: 06/17/2018  6:28 PM  Result Value Ref Range Status   Specimen Description   Final    TRACHEAL ASPIRATE Performed at [facility], [address], [place], [place] [address]    Special Requests   Final    NONE Performed at [facility], [address] [place], [place] [address]    Gram Stain   Final    ABUNDANT WBC PRESENT, PREDOMINANTLY  PMN ABUNDANT SQUAMOUS EPITHELIAL CELLS PRESENT NO ORGANISMS SEEN Performed at [facility], [place] [address] [place], [place] [address]    Culture RARE YEAST  Final   Report Status PENDING  Incomplete    MEDICATIONS   Current Facility-Administered Medications:  .  0.9 %  sodium chloride infusion, , Intravenous, PRN, Loletha Grayer, MD, Last Rate: 0 mL/hr at 06/20/18 0407 .  acetaminophen (TYLENOL) tablet 650 mg, 650 mg, Per Tube, Q6H PRN, Wilhelmina Mcardle, MD .  albuterol (PROVENTIL) (2.5 MG/3ML) 0.083% nebulizer solution 2.5 mg, 2.5 mg, Nebulization, Q4H PRN, Hillary Bow, MD, 2.5 mg at 06/24/18 0059 .  chlorhexidine gluconate (MEDLINE KIT) (PERIDEX) 0.12 % solution 15 mL, 15 mL, Mouth Rinse, BID, Blakeney, Dreama Saa, NP, 15 mL at 06/23/18 1918 .  dexamethasone (DECADRON) injection 4 mg, 4 mg, Intravenous, Q6H, Darel Hong D, NP, 4 mg at 06/24/18 0511 .  dextrose 5 % and 0.9 % NaCl with KCl 20 mEq/L infusion, , Intravenous, Continuous, Blakeney, Dreama Saa, NP, Last Rate: 125 mL/hr at 06/24/18 0700 .  famotidine (PEPCID) IVPB 20 mg premix, 20 mg, Intravenous, Q12H, Bradly Bienenstock, NP, Stopped at 06/23/18 2308 .  feeding supplement (VITAL 1.5 CAL) liquid 1,000 mL, 1,000 mL, Per Tube, Q24H, Conforti, John, DO, Last Rate: 35 mL/hr at 06/23/18 1240, 1,000 mL at 06/23/18 1240 .  free water 60 mL, 60 mL, Per Tube, Q4H, Conforti, John, DO, 60 mL at 06/24/18 0322 .  insulin aspart (novoLOG) injection 0-9 Units, 0-9 Units, Subcutaneous, Q4H, Bradly Bienenstock, NP, Stopped at 06/24/18 0347 .  ipratropium-albuterol (DUONEB) 0.5-2.5 (3) MG/3ML nebulizer solution 3 mL, 3 mL, Nebulization, Q6H, Darel Hong D, NP, 3 mL at 06/24/18 0737 .  levETIRAcetam (KEPPRA) 500 mg in sodium chloride 0.9 % 100 mL IVPB, 500 mg, Intravenous, Q12H, Wilhelmina Mcardle, MD, Stopped at 06/24/18 [phone] .  MEDLINE mouth rinse, 15 mL, Mouth Rinse, 10 times per day, Awilda Bill, NP, 15 mL at 06/24/18 0514 .   metoprolol tartrate (LOPRESSOR) injection 2.5-5 mg, 2.5-5 mg, Intravenous, Q3H PRN, Wilhelmina Mcardle, MD, 2.5 mg at 06/24/18 0438 .  norepinephrine (LEVOPHED) 59m in D5W 2593mpremix infusion, 0-40 mcg/min, Intravenous, Titrated, KeBradly BienenstockNP, Stopped at 06/23/18 0522 .  [DISCONTINUED] ondansetron (ZOFRAN) tablet 4 mg, 4 mg, Oral, Q6H PRN **OR** ondansetron (ZOFRAN) injection 4 mg, 4 mg, Intravenous, Q6H PRN, SrJodell CiproPrasanna, MD .  piperacillin-tazobactam (ZOSYN) IVPB 3.375 g, 3.375 g, Intravenous, Q8H, PaDustin FlockMD, Last Rate: 12.5 mL/hr at 06/24/18 0700 .  polyvinyl alcohol (LIQUIFILM TEARS) 1.4 % ophthalmic solution 1 drop, 1 drop, Both Eyes, QID PRN, Asencion Gowda, NP, 1 drop at 06/18/18 0425 .  sodium chloride 0.9 % injection 60 mL, 60 mL, Per Tube, PRN, Conforti, John, DO, 60 mL at 06/22/18 1130 .  sodium chloride flush (NS) 0.9 % injection 10-40 mL, 10-40 mL, Intracatheter, Q12H, Wieting, Richard, MD, 10 mL at 06/23/18 2134 .  sodium chloride flush (NS) 0.9 % injection 10-40 mL, 10-40 mL, Intracatheter, PRN, Loletha Grayer, MD, 10 mL at 06/20/18 0331      Indwelling Urinary Catheter continued, requirement due to   Reason to continue Indwelling Urinary Catheter for strict Intake/Output monitoring for hemodynamic instability   Central Line continued, requirement due to   Reason to continue [employer] Monitoring of central venous pressure or other hemodynamic parameters      ASSESSMENT AND PLAN   Acute Respiratory Failure s/p extubation 10/10  On 2lo2 -continue Bronchodilator Therapy -Wean Fio2 as tolerated   Aspiration pneumonia with bibasilar infilterates  L>R, cont zosyn, follow cxr and sputum c/s  Anemia without any overt bleeding s/p transfusion. Follow H&H   Status post repair gastric laceration gastrorrhaphy and gastrotomy 10/8  NEUROLOGY -awake and alert, follows simple commands  On keppra  Severe Sepsis/proteus uti/bacteremia -off  pressors,  On zosyn -follow up cultures  GI/Nutrition GI PROPHYLAXIS as indicated DIET-->TF's as tolerated Constipation protocol as indicated  ENDO - ICU hypoglycemic\Hyperglycemia protocol -check FSBS per protocol   ELECTROLYTES -follow labs as needed -replace as needed  Swollen lower lip ? Allergic reaction, on dexamethasone, will taper Zosyn changed to meropenum.    DVT/GI PRX ordered   Critical Care Time devoted to patient care services described in this note is 45 minutes.   Overall, patient is critically ill, prognosis is guarded. Severely malnourished, high risk of aspieration with very weak cough.  Rosine Door, MD  06/24/2018 7:39 AM [facility]

## 2018-06-24 NOTE — Progress Notes (Signed)
Nutrition Follow-up  DOCUMENTATION CODES:   Severe malnutrition in context of chronic illness  INTERVENTION:   Change to Jevity 1.5 @ goal rate of 51m/hr- Initiate at 270mhr and increase by 1071mvery 8 hours until goal rate is reached.   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  Ongoing.  GOAL:   Patient will meet greater than or equal to 90% of their needs -met with TFs  MONITOR:   Labs, Weight trends, TF tolerance, Skin, I & O's  ASSESSMENT:   Pt is resident of [facility]25 for urinary retention w/ foley catheter in place and failure to thrive. Previous admit 8/31PMH: emphysema, seizures, subdural heamtoma   Pt extubated 10/10; tolerating well. Pt also tolerating tube feeds well. Pt with diarrhea, will change over to a tube feed formula with fiber. Will monitor for abdominal distension and discomfort. Pt at refeed risk; recommend K, Mg and P labs daily.   Enteral Access: 16 Fr. G-tube placed surgically on 10/9; patient also has NGT in place but plan is to remove it  Medications reviewed and include: Novolog 0-9 units Q4hrs, D5W at 125 mL/hr (150 grams dextrose, 510 kcal daily), famotidine, fentanyl gtt, Keppra, magnesium sulfate 2 grams IV once today, Zosyn.  Labs reviewed: K 4.0 wnl, P 1.6(L), Mg 1.8 wnl Hgb 10.3(L), Hct 31.1(L) cbgs- 142, 139, 140 x 24 hrs  Diet Order:   Diet Order            Diet NPO time specified  Diet effective now             EDUCATION NEEDS:   No education needs have been identified at this time  Skin:  Skin Assessment: Skin Integrity Issues: Skin Integrity Issues:: Stage I, Incisions Stage  I: mid sacrum Incisions: closed incision to abdomen  Last BM:  10/11- type 7  Height:   Ht Readings from Last 1 Encounters:  07/05/2018 '5\' 11"'  (1.803 m)    Weight:   Wt Readings from Last 1 Encounters:  06/24/18 38.9 kg    Ideal Body Weight:  78.2 kg  BMI:  Body mass index is 11.96 kg/m.  Estimated Nutritional Needs:   Kcal:  1500-1800kcal/day   Protein:  54-60 grams  Fluid:  1 L/day  Marilee Ditommaso Koleen DistanceD, LDN Pager #- [phone]#- [phone] Hours Pager: [phone]

## 2018-06-24 NOTE — Progress Notes (Signed)
Pt had skin breakdown on right wrist, possible from the ID band. Cleaned with sterile gauze, covered with sterile gauze.

## 2018-06-24 NOTE — Consult Note (Signed)
Pharmacy Antibiotic Note  Alexander Lara is a 72 y.o. male admitted on 05/18/2018 with pneumonia.  Pharmacy has been consulted for Meropenem dosing. Admitted with on 9/25 for UTI and PNA . Patient underwent EGD on 10/8. Patient had hypoxia after procedure; O2 sats in the 60s and was transferred to ICU.[name] Abdominal x-ray shows large pneumoperitoneum - exploratory laparotomy 10/9. Potential upper GI series tomorrow for gastric repair.  Plan: Discontinuing Zosyn as patient has penicillin allergy. No reaction was documented, and patient has received Unasyn and Zosyn while inpatient. Post-extubation, patient was having lip swelling, so per discussion with Dr. Allena Napoleon, will start Merrem 1 g Q8h.  Height: 5\' 11"  (180.3 cm) Weight: 85 lb 12.1 oz (38.9 kg) IBW/kg (Calculated) : 75.3  Temp (24hrs), Avg:98.8 F (37.1 C), Min:96.4 F (35.8 C), Max:99.5 F (37.5 C)  Recent Labs  Lab 06/20/18 0453 07/02/2018 0426 06/27/2018 2002 07/03/2018 2049 06/22/18 0122 06/22/18 0442 06/23/18 0357 06/24/18 0339  WBC 11.9*  --  11.5*  --   --  15.1* 7.9 8.7  CREATININE  --  0.60* 0.51*  --   --  0.63 0.63 0.56*  LATICACIDVEN  --   --   --  1.9 1.0  --   --   --     Estimated Creatinine Clearance: 45.2 mL/min (A) (by C-G formula based on SCr of 0.56 mg/dL (L)).    Allergies  Allergen Reactions  . Penicillins     Lip Swelling  . Zosyn [Piperacillin Sod-Tazobactam So]     Antimicrobials this admission: 10/11 Merrem >> 10/9 Zosyn >> 10/11 10/7 Augmentin >> 10/8 9/30 Unasyn >>10/8 9/28 Flagyl >> 09/30 9/26 Ceftriaxone >> 9/30   Microbiology results: 9/25 BCx: Proteus - pansensitive  9/25 UCx: Proteus mirabilis resistant to Macrobid  10/8 Sputum: abundant WBCs  10/8 MRSA PCR: (-)  Thank you for allowing pharmacy to be a part of this patient's care.   Mauri Reading, PharmD Pharmacy Resident  06/24/2018 1:04 PM

## 2018-06-24 NOTE — Progress Notes (Signed)
Patient ID: Alexander Lara, male   DOB: 1946/08/26, 72 y.o.   MRN: [id number]   [employer] PROGRESS NOTE  Alexander Lara MNO:[id number] DOB: 1946-07-19 DOA: 05/15/2018 PCP: [facility] [place]  HPI/Subjective: Remains on vent  Objective: Vitals:   06/24/18 1300 06/24/18 1400  BP: (!) 210/99 (!) 159/77  Pulse: (!) 40 95  Resp: (!) 28 16  Temp: 98.8 F (37.1 C) 98.6 F (37 C)  SpO2: 97% 97%    Filed Weights   06/22/18 0430 06/23/18 0441 06/24/18 0326  Weight: 35.6 kg 35.8 kg 38.9 kg    ROS: Review of Systems  Unable to perform ROS: Acuity of condition   Exam: Physical Exam  Constitutional: He appears cachectic.  HENT:  Nose: No mucosal edema.  Mouth/Throat: No oropharyngeal exudate or posterior oropharyngeal edema.  Eyes: Pupils are equal, round, and reactive to light. Conjunctivae and lids are normal.  Neck: No JVD present. Carotid bruit is not present. No edema present. No thyroid mass and no thyromegaly present.  Cardiovascular: S1 normal and S2 normal. An irregularly irregular rhythm present. Exam reveals no gallop.  No murmur heard. Pulses:      Dorsalis pedis pulses are 2+ on the right side, and 2+ on the left side.  Respiratory: No respiratory distress. He has no wheezes. He has no rhonchi. He has no rales.  GI: Soft. Bowel sounds are normal. There is no tenderness.  Musculoskeletal:       Right ankle: He exhibits no swelling.       Left ankle: He exhibits no swelling.  Lymphadenopathy:    He has no cervical adenopathy.  Neurological: He is unresponsive.  Skin: Skin is warm. No rash noted. Nails show no clubbing.      Data Reviewed: Basic Metabolic Panel: Recent Labs  Lab 06/19/18 0514  06/20/18 0453 06/24/2018 0426 06/23/2018 2002 06/22/18 0442 06/23/18 0357 06/23/18 1202 06/24/18 0339  NA  --    < >  --  137 133* 137 135  --  138  K 3.7   < > 3.6 3.8 3.9 3.8 3.0* 4.3 4.0  CL  --    < >  --  110 106 112* 111  --  114*  CO2  --    < >  --  22  19* 20* 18*  --  18*  GLUCOSE  --    < >  --  93 164* 169* 161*  --  158*  BUN  --    < >  --  '15 14 14 11  ' --  10  CREATININE  --    < >  --  0.60* 0.51* 0.63 0.63  --  0.56*  CALCIUM  --    < >  --  7.5* 7.5* 7.3* 7.3*  --  7.3*  MG 1.7  --  1.8 2.0  --  1.8 2.1  --  1.8  PHOS 1.8*  --  2.5 2.5  --  2.9  --   --  1.6*   < > = values in this interval not displayed.   CBC: Recent Labs  Lab 06/20/18 0453 07/10/2018 2002 06/22/18 0442 06/23/18 0357 06/23/18 2100 06/24/18 0339  WBC 11.9* 11.5* 15.1* 7.9  --  8.7  NEUTROABS  --   --   --  6.8  --   --   HGB 8.4* 7.9* 7.4* 5.6* 9.9* 10.3*  HCT 25.7* 25.0* 22.9* 17.5* 29.3* 31.1*  MCV 79.2* 80.9 80.6 81.4  --  81.2  PLT 225 258 261 216  --  212   Cardiac Enzymes:   Recent Results (from the past 240 hour(s))  MRSA PCR Screening     Status: None   Collection Time: 07/11/2018 11:46 AM  Result Value Ref Range Status   MRSA by PCR NEGATIVE NEGATIVE Final    Comment:        The GeneXpert MRSA Assay (FDA approved for NASAL specimens only), is one component of a comprehensive MRSA colonization surveillance program. It is not intended to diagnose MRSA infection nor to guide or monitor treatment for MRSA infections. Performed at [facility], [address] [place], [place] [address]   Culture, respiratory (non-expectorated)     Status: None   Collection Time: 06/25/2018  6:28 PM  Result Value Ref Range Status   Specimen Description   Final    TRACHEAL ASPIRATE Performed at [facility], [address], [place], [place] [address]    Special Requests   Final    NONE Performed at [facility], [address] [place], [place] [address]    Gram Stain   Final    ABUNDANT WBC PRESENT, PREDOMINANTLY PMN ABUNDANT SQUAMOUS EPITHELIAL CELLS PRESENT NO ORGANISMS SEEN Performed at [facility], [place] [address], [place], [place] [address]    Culture RARE CANDIDA ALBICANS  Final   Report Status  06/24/2018 FINAL  Final     Studies: US Venous Img Upper Uni Right  Result Date: 06/24/2018 CLINICAL DATA:  Edema EXAM: RIGHT UPPER EXTREMITY VENOUS DOPPLER ULTRASOUND TECHNIQUE: Gray-scale sonography with graded compression, as well as color Doppler and duplex ultrasound were performed to evaluate the upper extremity deep venous system from the level of the subclavian vein and including the jugular, axillary, basilic and upper cephalic vein. Spectral Doppler was utilized to evaluate flow at rest and with distal augmentation maneuvers. COMPARISON:  None. FINDINGS: Thrombus within deep veins:  None visualized. Compressibility of deep veins:  Normal. Duplex waveform respiratory phasicity:  Normal. Duplex waveform response to augmentation:  Normal. Venous reflux:  None visualized. Other findings: On routine limited images of the contralateral left subclavian vein, incompletely occlusive thrombus is noted around PICC line. IMPRESSION: 1. Negative for right upper extremity DVT. 2. POSITIVE for nonocclusive DVT in the left subclavian vein adjacent to PICC catheter. Electronically Signed   By: Lucrezia Europe M.D.   On: 06/24/2018 12:28   Dg Chest Port 1 View  Result Date: 06/23/2018 CLINICAL DATA:  Ventilated patient.  History of emphysema. EXAM: PORTABLE CHEST 1 VIEW COMPARISON:  06/22/2018 FINDINGS: The enteric catheter has been removed. Left PICC line and endotracheal tube in stable position. Cardiomediastinal silhouette is normal. Mediastinal contours appear intact. Calcific atherosclerotic disease of the aorta. There is no evidence of pleural effusion or pneumothorax. Focal airspace consolidation the left lower lobe, more prominent than on the prior radiograph. Scattered peribronchial airspace consolidation in the right lower lobe. Baseline emphysematous changes. Osseous structures are without acute abnormality. Decreased volume of known pneumoperitoneum. IMPRESSION: Worsening of left lower thorax airspace  consolidation. Stable appearance of patchy peribronchial right lower lobe airspace consolidation. Decreased volume pneumoperitoneum. Electronically Signed   By: Fidela Salisbury M.D.   On: 06/23/2018 11:31   [place] Ekg Site Rite  Result Date: 06/23/2018 If Site Rite image not attached, placement could not be confirmed due to current cardiac rhythm.   Scheduled Meds: . chlorhexidine gluconate (MEDLINE KIT)  15 mL Mouth Rinse BID  . dexamethasone  4 mg Intravenous Q8H  . free water  60 mL Per Tube Q4H  . insulin aspart      . ipratropium-albuterol  3 mL Nebulization Q6H  . mouth rinse  15 mL Mouth Rinse 10 times per day  . [START ON 06/25/2018] multivitamin  15 mL Per Tube Daily  . sodium chloride flush  10-40 mL Intracatheter Q12H  . [START ON 06/25/2018] thiamine  100 mg Per Tube Daily  . [START ON 06/25/2018] vitamin C  250 mg Per Tube BID   Continuous Infusions: . sodium chloride 0 mL/hr at 06/20/18 0407  . dextrose 5 % and 0.9 % NaCl with KCl 20 mEq/L 125 mL/hr at 06/24/18 1209  . famotidine (PEPCID) IV 20 mg (06/24/18 1020)  . feeding supplement (JEVITY 1.5 CAL/FIBER) 1,000 mL (06/24/18 1355)  . levETIRAcetam Stopped (06/24/18 0610)  . meropenem (MERREM) IV 1 g (06/24/18 1420)  . norepinephrine (LEVOPHED) Adult infusion Stopped (06/23/18 0522)  . sodium phosphate  Dextrose 5% IVPB 30 mmol (06/24/18 1102)    Assessment/Plan:   1. Acute respiratory failure post surgery to new ventilator remains on vent 2. Status post gastric laceration status post gastrorrhaphy and gastrostomy.  The recommendations per surgery 3. Proteus sepsis from indwelling Foley catheter.  Blood cultures and urine cultures positive.  Foley catheter changed.  This will have to be changed every 4 weeks.  Continue zosyn 4. Clostridium species also and blood culture.  Continue IV zosyn which would cover Clostridium species also.  Family decided against colonoscopy at this point. 5. Failed swallow evaluation.   Status post surgery for gastric tube 6. chronic aspiration pneumonia contineu abx 7. Atrial fibrillation with rapid ventricular response.  Hr stable 8. History of seizures on Keppra 9. Failure to thrive, cachexia and malnutrition.  Overall prognosis is poor.  Patient is a DNR.  Now his condition is even worsened post procedure 10. BPH.  meds on hold   Code Status:     Code Status Orders  (From admission, onward)         Start     Ordered   06/10/18 1120  Do not attempt resuscitation (DNR)  Continuous    Question Answer Comment  In the event of cardiac or respiratory ARREST Do not call a "code blue"   In the event of cardiac or respiratory ARREST Do not perform Intubation, CPR, defibrillation or ACLS   In the event of cardiac or respiratory ARREST Use medication by any route, position, wound care, and other measures to relive pain and suffering. May use oxygen, suction and manual treatment of airway obstruction as needed for comfort.      06/10/18 1121        Code Status History    Date Active Date Inactive Code Status Order ID Comments User Context   06/09/2018 0209 06/10/2018 1121 DNR [id number]  Arta Silence, MD ED   05/15/2018 1120 05/18/2018 2303 Full Code [id number]  Dustin Flock, MD Inpatient   05/15/2018 0125 05/15/2018 1120 Full Code [id number]  Amelia Jo, MD Inpatient    Advance Directive Documentation     Most Recent Value  Type of Advance Directive  Healthcare Power of Attorney  Pre-existing out of facility DNR order (yellow form or pink MOST form)  -  "MOST" Form in Place?  -     Disposition Plan: Will not be able to get PEG tube until next week  Antibiotics:  Unasyn  Time spent: 25 minutes.  Spoke with fianc on the  phone today.  Sister did not pick up the phone when I called her.  Bryon Parker [facility]

## 2018-06-25 LAB — CBC WITH DIFFERENTIAL/PLATELET
Abs Immature Granulocytes: 0.07 10*3/uL (ref 0.00–0.07)
BASOS PCT: 0 %
Basophils Absolute: 0 10*3/uL (ref 0.0–0.1)
EOS PCT: 0 %
Eosinophils Absolute: 0 10*3/uL (ref 0.0–0.5)
HEMATOCRIT: 30.2 % — AB (ref 39.0–52.0)
Hemoglobin: 10.1 g/dL — ABNORMAL LOW (ref 13.0–17.0)
Immature Granulocytes: 1 %
LYMPHS ABS: 0.3 10*3/uL — AB (ref 0.7–4.0)
Lymphocytes Relative: 3 %
MCH: 26.7 pg (ref 26.0–34.0)
MCHC: 33.4 g/dL (ref 30.0–36.0)
MCV: 79.9 fL — AB (ref 80.0–100.0)
MONO ABS: 0.2 10*3/uL (ref 0.1–1.0)
MONOS PCT: 3 %
Neutro Abs: 8.5 10*3/uL — ABNORMAL HIGH (ref 1.7–7.7)
Neutrophils Relative %: 93 %
Platelets: 203 10*3/uL (ref 150–400)
RBC: 3.78 MIL/uL — ABNORMAL LOW (ref 4.22–5.81)
RDW: 19.6 % — AB (ref 11.5–15.5)
WBC: 9.1 10*3/uL (ref 4.0–10.5)
nRBC: 0 % (ref 0.0–0.2)

## 2018-06-25 LAB — BASIC METABOLIC PANEL
Anion gap: 5 (ref 5–15)
BUN: 9 mg/dL (ref 8–23)
CHLORIDE: 111 mmol/L (ref 98–111)
CO2: 20 mmol/L — AB (ref 22–32)
CREATININE: 0.54 mg/dL — AB (ref 0.61–1.24)
Calcium: 7.2 mg/dL — ABNORMAL LOW (ref 8.9–10.3)
Glucose, Bld: 158 mg/dL — ABNORMAL HIGH (ref 70–99)
POTASSIUM: 3.5 mmol/L (ref 3.5–5.1)
Sodium: 136 mmol/L (ref 135–145)

## 2018-06-25 LAB — GLUCOSE, CAPILLARY
GLUCOSE-CAPILLARY: 86 mg/dL (ref 70–99)
Glucose-Capillary: 135 mg/dL — ABNORMAL HIGH (ref 70–99)
Glucose-Capillary: 133 mg/dL — ABNORMAL HIGH (ref 70–99)

## 2018-06-25 LAB — MAGNESIUM: MAGNESIUM: 1.7 mg/dL (ref 1.7–2.4)

## 2018-06-25 LAB — PHOSPHORUS: PHOSPHORUS: 2 mg/dL — AB (ref 2.5–4.6)

## 2018-06-25 MED ORDER — POTASSIUM PHOSPHATES 15 MMOLE/5ML IV SOLN
20.0000 mmol | Freq: Once | INTRAVENOUS | Status: AC
  Administered 2018-06-25: 20 mmol via INTRAVENOUS
  Filled 2018-06-25: qty 6.67

## 2018-06-25 MED ORDER — MAGNESIUM SULFATE 2 GM/50ML IV SOLN
2.0000 g | Freq: Once | INTRAVENOUS | Status: AC
  Administered 2018-06-25: 2 g via INTRAVENOUS
  Filled 2018-06-25: qty 50

## 2018-06-25 MED ORDER — ENOXAPARIN SODIUM 60 MG/0.6ML ~~LOC~~ SOLN
1.5000 mg/kg | SUBCUTANEOUS | Status: DC
  Administered 2018-06-25 – 2018-06-27 (×3): 60 mg via SUBCUTANEOUS
  Filled 2018-06-25 (×4): qty 0.6

## 2018-06-25 NOTE — Consult Note (Signed)
Pharmacy Electrolyte Monitoring Consult:  Pharmacy consulted to assist in monitoring and replacing electrolytes in this 72 y.o. male admitted on 06/13/2018 with Urinary Retention  Patient admitted to ICU s/p PEG placement.   Labs:  Sodium (mmol/L)  Date Value  06/25/2018 136   Potassium (mmol/L)  Date Value  06/25/2018 3.5   Magnesium (mg/dL)  Date Value  78/29/5621 1.7   Phosphorus (mg/dL)  Date Value  30/86/5784 2.0 (L)   Calcium (mg/dL)  Date Value  69/62/9528 7.2 (L)   Albumin (g/dL)  Date Value  41/32/4401 1.6 (L)   Corrected Calcium: 8.7   Assessment/Plan: Patient currently receiving D5NS/70mEq @ 173mL/hr. Patient's blood glucose remains < 150 and has not been initiated on tube feeds.   Will order mangesium 2g IV x 1 and potassium phosphate IV x 1.   Will consider replace for goal of potassium ~ 4, goal magensium ~ 2, and goal phos > 2.5. Patient is at risk for refeeding.   Pharmacy will continue to monitor and adjust per consult.   Simpson,Michael L, 06/25/2018 7:50 AM

## 2018-06-25 NOTE — Progress Notes (Signed)
CRITICAL CARE NOTE  CC  follow up respiratory failure  SUBJECTIVE No new complains. Denies any pain    SIGNIFICANT EVENTS None    BP (!) 152/75   Pulse (!) 102   Temp 99.1 F (37.3 C)   Resp 15   Ht _0  (1.803 m)   Wt 40.9 kg   SpO2 97%   BMI 12.58 kg/m       PHYSICAL EXAMINATION:  GENERAL:critically ill appearing, no resp distress, weak cough HEAD: Normocephalic, atraumatic.  EYES: Pupils equal, round, reactive to light.  No scleral icterus.  MOUTH: Moist mucosal membrane.sig decrease in the lower lip swelling, NECK: Supple. No thyromegaly. No nodules. No JVD.  PULMONARY: +rhonchi,  CARDIOVASCULAR: S1 and S2. Regular rate and rhythm. No murmurs, rubs, or gallops.  GASTROINTESTINAL: Soft, nontender, -distended. No masses. Positive bowel sounds. No hepatosplenomegaly.  MUSCULOSKELETAL: No swelling, clubbing, or edema.  NEUROLOGIC: awake and alert , follows simple commands SKIN:intact,warm,dry  INTAKE/OUTPUT  Intake/Output Summary (Last 24 hours) at 06/25/2018 0728 Last data filed at 06/25/2018 0600 Gross per 24 hour  Intake 2806.27 ml  Output 2750 ml  Net 56.27 ml    LABS  CBC Recent Labs  Lab 06/23/18 0357 06/23/18 2100 06/24/18 0339 06/25/18 0434  WBC 7.9  --  8.7 9.1  HGB 5.6* 9.9* 10.3* 10.1*  HCT 17.5* 29.3* 31.1* 30.2*  PLT 216  --  212 203   Coag's Recent Labs  Lab 06/20/18 0453  INR 1.16   BMET Recent Labs  Lab 06/23/18 0357 06/23/18 1202 06/24/18 0339 06/25/18 0434  NA 135  --  138 136  K 3.0* 4.3 4.0 3.5  CL 111  --  114* 111  CO2 18*  --  18* 20*  BUN 11  --  10 9  CREATININE 0.63  --  0.56* 0.54*  GLUCOSE 161*  --  158* 158*   Electrolytes Recent Labs  Lab 06/22/18 0442 06/23/18 0357 06/24/18 0339 06/25/18 0434  CALCIUM 7.3* 7.3* 7.3* 7.2*  MG 1.8 2.1 1.8 1.7  PHOS 2.9  --  1.6* 2.0*   Sepsis Markers Recent Labs  Lab 06/23/2018 2049 06/22/18 0122 06/22/18 0442 06/23/18 0357  LATICACIDVEN 1.9 1.0  --    --   PROCALCITON  --  0.17 0.58 0.97   ABG Recent Labs  Lab 06/14/2018 1632 06/22/18 0433  PHART 7.51* 7.46*  PCO2ART 22* 25*  PO2ART 40* 353*   Liver Enzymes Recent Labs  Lab 06/19/18 1859 06/23/18 1202  AST 18  --   ALT 15  --   ALKPHOS 45  --   BILITOT 0.4  --   ALBUMIN 2.1* 1.6*   Cardiac Enzymes Recent Labs  Lab 06/22/18 0442  TROPONINI <0.03   Glucose Recent Labs  Lab 06/24/18 0714 06/24/18 1151 06/24/18 1605 06/24/18 1939 06/24/18 2335 06/25/18 0357  GLUCAP 139* 140* 138* 130* 148* 135*     Recent Results (from the past 240 hour(s))  MRSA PCR Screening     Status: None   Collection Time: 07/01/2018 11:46 AM  Result Value Ref Range Status   MRSA by PCR NEGATIVE NEGATIVE Final    Comment:        The GeneXpert MRSA Assay (FDA approved for NASAL specimens only), is one component of a comprehensive MRSA colonization surveillance program. It is not intended to diagnose MRSA infection nor to guide or monitor treatment for MRSA infections. Performed at [facility], [address], [place], [place] [address]  Culture, respiratory (non-expectorated)     Status: None   Collection Time: 06/28/2018  6:28 PM  Result Value Ref Range Status   Specimen Description   Final    TRACHEAL ASPIRATE Performed at [facility], [address], [place], [place] [address]    Special Requests   Final    NONE Performed at [facility], [address] [place], [place] [address]    Gram Stain   Final    ABUNDANT WBC PRESENT, PREDOMINANTLY PMN ABUNDANT SQUAMOUS EPITHELIAL CELLS PRESENT NO ORGANISMS SEEN Performed at [facility], [place] [address] [place], [place] [address]    Culture RARE CANDIDA ALBICANS  Final   Report Status 06/24/2018 FINAL  Final    MEDICATIONS   Current Facility-Administered Medications:  .  0.9 %  sodium chloride infusion, , Intravenous, PRN, Loletha Grayer, MD, Last Rate: 5 mL/hr at 06/24/18 1637,  250 mL at 06/24/18 1637 .  acetaminophen (TYLENOL) tablet 650 mg, 650 mg, Per Tube, Q6H PRN, Wilhelmina Mcardle, MD, 650 mg at 06/25/18 0103 .  albuterol (PROVENTIL) (2.5 MG/3ML) 0.083% nebulizer solution 2.5 mg, 2.5 mg, Nebulization, Q4H PRN, Sudini, Srikar, MD, 2.5 mg at 06/24/18 0059 .  chlorhexidine gluconate (MEDLINE KIT) (PERIDEX) 0.12 % solution 15 mL, 15 mL, Mouth Rinse, BID, Blakeney, Dreama Saa, NP, 15 mL at 06/24/18 2115 .  dexamethasone (DECADRON) injection 4 mg, 4 mg, Intravenous, Q8H, Rosine Door, MD, 4 mg at 06/25/18 0518 .  dextrose 5 % and 0.9 % NaCl with KCl 20 mEq/L infusion, , Intravenous, Continuous, Blakeney, Dreama Saa, NP, Last Rate: 125 mL/hr at 06/25/18 0600 .  famotidine (PEPCID) IVPB 20 mg premix, 20 mg, Intravenous, Q12H, Darel Hong D, NP, Last Rate: 100 mL/hr at 06/24/18 2216, 20 mg at 06/24/18 2216 .  feeding supplement (JEVITY 1.5 CAL/FIBER) liquid 1,000 mL, 1,000 mL, Per Tube, Continuous, Rosine Door, MD, Last Rate: 30 mL/hr at 06/25/18 0520 .  free water 60 mL, 60 mL, Per Tube, Q4H, Conforti, John, DO, 60 mL at 06/25/18 0400 .  ipratropium-albuterol (DUONEB) 0.5-2.5 (3) MG/3ML nebulizer solution 3 mL, 3 mL, Nebulization, Q6H, Darel Hong D, NP, 3 mL at 06/25/18 0206 .  levETIRAcetam (KEPPRA) 500 mg in sodium chloride 0.9 % 100 mL IVPB, 500 mg, Intravenous, Q12H, Wilhelmina Mcardle, MD, Last Rate: 420 mL/hr at 06/25/18 0634, 500 mg at 06/25/18 0634 .  MEDLINE mouth rinse, 15 mL, Mouth Rinse, 10 times per day, Awilda Bill, NP, 15 mL at 06/25/18 0530 .  meropenem (MERREM) 1 g in sodium chloride 0.9 % 100 mL IVPB, 1 g, Intravenous, Q12H, Paticia Stack, [facility], Last Rate: 200 mL/hr at 06/24/18 2321, 1 g at 06/24/18 2321 .  metoprolol tartrate (LOPRESSOR) injection 2.5-5 mg, 2.5-5 mg, Intravenous, Q3H PRN, Wilhelmina Mcardle, MD, 2.5 mg at 06/25/18 0057 .  multivitamin liquid 15 mL, 15 mL, Per Tube, Daily, Rosine Door, MD .  norepinephrine (LEVOPHED) 23m in D5W  2522mpremix infusion, 0-40 mcg/min, Intravenous, Titrated, KeBradly BienenstockNP, Stopped at 06/23/18 0522 .  [DISCONTINUED] ondansetron (ZOFRAN) tablet 4 mg, 4 mg, Oral, Q6H PRN **OR** ondansetron (ZOFRAN) injection 4 mg, 4 mg, Intravenous, Q6H PRN, SrJodell CiproPrasanna, MD .  polyvinyl alcohol (LIQUIFILM TEARS) 1.4 % ophthalmic solution 1 drop, 1 drop, Both Eyes, QID PRN, GrAsencion GowdaNP, 1 drop at 06/18/18 0425 .  sodium chloride 0.9 % injection 60 mL, 60 mL, Per Tube, PRN, Conforti, John, DO, 60 mL at 06/22/18  1130 .  sodium chloride flush (NS) 0.9 % injection 10-40 mL, 10-40 mL, Intracatheter, Q12H, Wieting, Richard, MD, 10 mL at 06/24/18 2223 .  sodium chloride flush (NS) 0.9 % injection 10-40 mL, 10-40 mL, Intracatheter, PRN, Loletha Grayer, MD, 10 mL at 06/24/18 1237 .  thiamine (VITAMIN B-1) tablet 100 mg, 100 mg, Per Tube, Daily, Rosine Door, MD .  vitamin C (ASCORBIC ACID) tablet 250 mg, 250 mg, Per Tube, BID, Rosine Door, MD      Indwelling Urinary Catheter continued, requirement due to   Reason to continue Indwelling Urinary Catheter for strict Intake/Output monitoring for hemodynamic instability  PICC line continued, requirement due to IV infusions    06/24/18 UE dopplers neg DVT RUE,  incidental LUE partial subclavian non occlusive DVT  ASSESSMENT AND PLAN SYNOPSIS   Acute Respiratory Failure s/p extubation 10/10  On 2lo2 -continue Bronchodilator Therapy -Wean Fio2 as tolerated   Incidental LUE Extremity non occlusive subclavian  DVT associated with PICC, high risk for bleeding rx with lovenox 1.43m/kg/day ( 60 mg daily)  Aspiration pneumonia with bibasilar infilterates  L>R, cont meropenum, follow cxr and sputum c/s  Anemia without any overt bleeding s/p transfusion.stable. Follow H&H   Status post repair gastric laceration gastrorrhaphyand gastrotomy 10/8  NEUROLOGY -awake and alert, follows simple commands  On keppra  Severe  Sepsis/proteus uti/bacteremia -off pressors, pt on meropenum  GI/Nutrition  DIET-->TF's as tolerated, on 371mhr Constipation protocol as indicated  ENDO - ICU hypoglycemic\Hyperglycemia protocol -check FSBS per protocol   ELECTROLYTES -follow labs as needed -replace as needed  Swollen lower lip ? Allergic reaction, on dexamethasone, resolved, will stop dexamethasone  Transfer to floor, tele bed     Time devoted to patient care services described in this note is 30 minutes.    KhRosine DoorMD  06/25/2018 7:28 AM [facility]

## 2018-06-25 NOTE — Progress Notes (Signed)
PT Cancellation Note  Patient Details Name: [name] MRN: [id number] DOB: October 15, 1945   Cancelled Treatment:    Reason Eval/Treat Not Completed: Medical issues which prohibited therapy.  Diastolic BP was 122, will try again at another time.   Ivar Drape 06/25/2018, 1:06 PM   Samul Dada, PT MS Acute Rehab Dept. Number: [facility] [id number] and [facility] [phone]

## 2018-06-25 NOTE — Progress Notes (Signed)
Patient ID: Alexander Lara, male   DOB: 1946/03/09, 72 y.o.   MRN: [id number]   [facility] PROGRESS NOTE  Alexander Lara UYQ:[id number] DOB: Jun 28, 1946 DOA: 05/31/2018 PCP: [facility]  HPI/Subjective: Patient extubated patient is eye open but does not communicate tolerating his tube feeds Objective: Vitals:   06/25/18 0900 06/25/18 1000  BP: (!) 150/68 (!) 171/92  Pulse: 76 (!) 41  Resp: 12 17  Temp: 99.1 F (37.3 C) 98.8 F (37.1 C)  SpO2:      Filed Weights   06/23/18 0441 06/24/18 0326 06/25/18 0500  Weight: 35.8 kg 38.9 kg 40.9 kg    ROS: Review of Systems  Unable to perform ROS: Acuity of condition   Exam: Physical Exam  Constitutional: He appears cachectic.  HENT:  Nose: No mucosal edema.  Mouth/Throat: No oropharyngeal exudate or posterior oropharyngeal edema.  Eyes: Pupils are equal, round, and reactive to light. Conjunctivae and lids are normal.  Neck: No JVD present. Carotid bruit is not present. No edema present. No thyroid mass and no thyromegaly present.  Cardiovascular: S1 normal and S2 normal. An irregularly irregular rhythm present. Exam reveals no gallop.  No murmur heard. Pulses:      Dorsalis pedis pulses are 2+ on the right side, and 2+ on the left side.  Respiratory: No respiratory distress. He has no wheezes. He has no rhonchi. He has no rales.  GI: Soft. Bowel sounds are normal. There is no tenderness.  Musculoskeletal:       Right ankle: He exhibits no swelling.       Left ankle: He exhibits no swelling.  Lymphadenopathy:    He has no cervical adenopathy.  Neurological: He is unresponsive.  Skin: Skin is warm. No rash noted. Nails show no clubbing.      Data Reviewed: Basic Metabolic Panel: Recent Labs  Lab 06/20/18 0453 06/30/2018 0426 07/05/2018 2002 06/22/18 0442 06/23/18 0357 06/23/18 1202 06/24/18 0339 06/25/18 0434  NA  --  137 133* 137 135  --  138 136  K 3.6 3.8 3.9 3.8 3.0* 4.3 4.0 3.5  CL  --  110 106 112*  111  --  114* 111  CO2  --  22 19* 20* 18*  --  18* 20*  GLUCOSE  --  93 164* 169* 161*  --  158* 158*  BUN  --  _0 --  10 9  CREATININE  --  0.60* 0.51* 0.63 0.63  --  0.56* 0.54*  CALCIUM  --  7.5* 7.5* 7.3* 7.3*  --  7.3* 7.2*  MG 1.8 2.0  --  1.8 2.1  --  1.8 1.7  PHOS 2.5 2.5  --  2.9  --   --  1.6* 2.0*   CBC: Recent Labs  Lab 06/22/2018 2002 06/22/18 0442 06/23/18 0357 06/23/18 2100 06/24/18 0339 06/25/18 0434  WBC 11.5* 15.1* 7.9  --  8.7 9.1  NEUTROABS  --   --  6.8  --   --  8.5*  HGB 7.9* 7.4* 5.6* 9.9* 10.3* 10.1*  HCT 25.0* 22.9* 17.5* 29.3* 31.1* 30.2*  MCV 80.9 80.6 81.4  --  81.2 79.9*  PLT 258 261 216  --  212 203   Cardiac Enzymes:   Recent Results (from the past 240 hour(s))  MRSA PCR Screening     Status: None   Collection Time: 06/15/2018 11:46 AM  Result Value Ref Range Status   MRSA by PCR NEGATIVE NEGATIVE Final  Comment:        The GeneXpert MRSA Assay (FDA approved for NASAL specimens only), is one component of a comprehensive MRSA colonization surveillance program. It is not intended to diagnose MRSA infection nor to guide or monitor treatment for MRSA infections. Performed at [facility], [address] [place], [place] [address]   Culture, respiratory (non-expectorated)     Status: None   Collection Time: 07/09/2018  6:28 PM  Result Value Ref Range Status   Specimen Description   Final    TRACHEAL ASPIRATE Performed at [facility], [address] [place], [place] [address]    Special Requests   Final    NONE Performed at [facility], [address] [place], [place] [address]    Gram Stain   Final    ABUNDANT WBC PRESENT, PREDOMINANTLY PMN ABUNDANT SQUAMOUS EPITHELIAL CELLS PRESENT NO ORGANISMS SEEN Performed at [facility], [address], [place],  [address]    Culture RARE CANDIDA ALBICANS  Final   Report Status 06/24/2018 FINAL  Final     Studies: US Venous Img  Upper Uni Right  Result Date: 06/24/2018 CLINICAL DATA:  Edema EXAM: RIGHT UPPER EXTREMITY VENOUS DOPPLER ULTRASOUND TECHNIQUE: Gray-scale sonography with graded compression, as well as color Doppler and duplex ultrasound were performed to evaluate the upper extremity deep venous system from the level of the subclavian vein and including the jugular, axillary, basilic and upper cephalic vein. Spectral Doppler was utilized to evaluate flow at rest and with distal augmentation maneuvers. COMPARISON:  None. FINDINGS: Thrombus within deep veins:  None visualized. Compressibility of deep veins:  Normal. Duplex waveform respiratory phasicity:  Normal. Duplex waveform response to augmentation:  Normal. Venous reflux:  None visualized. Other findings: On routine limited images of the contralateral left subclavian vein, incompletely occlusive thrombus is noted around PICC line. IMPRESSION: 1. Negative for right upper extremity DVT. 2. POSITIVE for nonocclusive DVT in the left subclavian vein adjacent to PICC catheter. Electronically Signed   By: Lucrezia Europe M.D.   On: 06/24/2018 12:28    Scheduled Meds: . chlorhexidine gluconate (MEDLINE KIT)  15 mL Mouth Rinse BID  . enoxaparin (LOVENOX) injection  1.5 mg/kg Subcutaneous Q24H  . free water  60 mL Per Tube Q4H  . ipratropium-albuterol  3 mL Nebulization Q6H  . mouth rinse  15 mL Mouth Rinse 10 times per day  . multivitamin  15 mL Per Tube Daily  . sodium chloride flush  10-40 mL Intracatheter Q12H  . thiamine  100 mg Per Tube Daily  . vitamin C  250 mg Per Tube BID   Continuous Infusions: . sodium chloride 250 mL (06/24/18 1637)  . dextrose 5 % and 0.9 % NaCl with KCl 20 mEq/L 125 mL/hr at 06/25/18 0600  . famotidine (PEPCID) IV 20 mg (06/25/18 1101)  . feeding supplement (JEVITY 1.5 CAL/FIBER) 30 mL/hr at 06/25/18 0520  . levETIRAcetam 500 mg (06/25/18 0263)  . meropenem (MERREM) IV 1 g (06/24/18 2321)  . norepinephrine (LEVOPHED) Adult infusion  Stopped (06/23/18 0522)  . potassium PHOSPHATE IVPB (in mmol) 20 mmol (06/25/18 1100)    Assessment/Plan:   1. Acute respiratory failure post surgery extubated continue oxygen for now; he is awake, unableable to communicate. 2.  status post gastric laceration status post gastrorrhaphy and gastrostomy.  Tolerating tube feeds 3.  Proteus sepsis from indwelling Foley catheter.  Blood cultures and urine cultures positive.  Foley catheter changed.  This will have  to be changed every 4 weeks.  4. Clostridium, Proteus in the blood: Continue meropenem..  Family decided against colonoscopy at this point.  Appreciate palliative care team following the patient, patient's son will be arriving tomorrow 9. failed swallow evaluation.  Status post surgery for gastric tube, tolerating the tube feeding. 6. chronic aspiration pneumonia ;contineu abx 7. Atrial fibrillation with rapid ventricular response.  Hr stable 8. History of seizures: On Keppra 9. Failure to thrive, cachexia and malnutrition.  Overall prognosis is poor.  Patient is a DNR.  Now his condition is even worsened post procedure, patient's son is coming from [place] tomorrow, that time we will discuss about possible comfort care options.  At present continue full treatment. 10. BPH.  meds on hold #11 , severe malnutrition in the context of chronic illness, continue tube feeding.  Code Status: Partial code    Code Status Orders  (From admission, onward)         Start     Ordered   06/10/18 1120  Do not attempt resuscitation (DNR)  Continuous    Question Answer Comment  In the event of cardiac or respiratory ARREST Do not call a "code blue"   In the event of cardiac or respiratory ARREST Do not perform Intubation, CPR, defibrillation or ACLS   In the event of cardiac or respiratory ARREST Use medication by any route, position, wound care, and other measures to relive pain and suffering. May use oxygen, suction and manual treatment of airway  obstruction as needed for comfort.      06/10/18 1121        Code Status History    Date Active Date Inactive Code Status Order ID Comments User Context   06/09/2018 0209 06/10/2018 1121 DNR [id number]  Arta Silence, MD ED   05/15/2018 1120 05/18/2018 2303 Full Code [id number]  Dustin Flock, MD Inpatient   05/15/2018 0125 05/15/2018 1120 Full Code [id number]  Amelia Jo, MD Inpatient    Advance Directive Documentation     Most Recent Value  Type of Advance Directive  Healthcare Power of Attorney  Pre-existing out of facility DNR order (yellow form or pink MOST form)  -  "MOST" Form in Place?  -     Disposition Plan:   Antibiotics:  Unasyn  Time spent: 25 minutes.    Epifanio Lesches  [employer]

## 2018-06-25 NOTE — Progress Notes (Signed)
Have nasally suctioned patient x2 during shift for moderate amount of secretions.  Patient has received cpt via bed once.  Family member questioned if therapy would do more damage to present subdural hematoma that patient received from a fall a couple of months ago and bed has an intense "jarring" effect. Told her will discuss with provider before continuing but will continue treatments and suctioning.

## 2018-06-26 ENCOUNTER — Inpatient Hospital Stay: Payer: Medicare Other

## 2018-06-26 DIAGNOSIS — J9600 Acute respiratory failure, unspecified whether with hypoxia or hypercapnia: Secondary | ICD-10-CM

## 2018-06-26 LAB — CBC WITH DIFFERENTIAL/PLATELET
Abs Immature Granulocytes: 0.01 10*3/uL (ref 0.00–0.07)
Basophils Absolute: 0 10*3/uL (ref 0.0–0.1)
Basophils Relative: 0 %
EOS PCT: 2 %
Eosinophils Absolute: 0.2 10*3/uL (ref 0.0–0.5)
HCT: 41.3 % (ref 39.0–52.0)
Hemoglobin: 13.5 g/dL (ref 13.0–17.0)
Immature Granulocytes: 0 %
Lymphocytes Relative: 6 %
Lymphs Abs: 0.6 10*3/uL — ABNORMAL LOW (ref 0.7–4.0)
MCH: 26.9 pg (ref 26.0–34.0)
MCHC: 32.7 g/dL (ref 30.0–36.0)
MCV: 82.3 fL (ref 80.0–100.0)
MONO ABS: 0.3 10*3/uL (ref 0.1–1.0)
MONOS PCT: 3 %
Neutro Abs: 8.5 10*3/uL — ABNORMAL HIGH (ref 1.7–7.7)
Neutrophils Relative %: 89 %
Platelets: 268 10*3/uL (ref 150–400)
RBC: 5.02 MIL/uL (ref 4.22–5.81)
RDW: 19.7 % — AB (ref 11.5–15.5)
WBC: 9.6 10*3/uL (ref 4.0–10.5)
nRBC: 0 % (ref 0.0–0.2)

## 2018-06-26 LAB — BASIC METABOLIC PANEL
Anion gap: 7 (ref 5–15)
BUN: 11 mg/dL (ref 8–23)
CALCIUM: 7.6 mg/dL — AB (ref 8.9–10.3)
CHLORIDE: 103 mmol/L (ref 98–111)
CO2: 22 mmol/L (ref 22–32)
CREATININE: 0.62 mg/dL (ref 0.61–1.24)
Glucose, Bld: 116 mg/dL — ABNORMAL HIGH (ref 70–99)
Potassium: 3.7 mmol/L (ref 3.5–5.1)
SODIUM: 132 mmol/L — AB (ref 135–145)

## 2018-06-26 LAB — BLOOD GAS, ARTERIAL
ACID-BASE DEFICIT: 7.9 mmol/L — AB (ref 0.0–2.0)
ACID-BASE DEFICIT: 5 mmol/L — AB (ref 0.0–2.0)
Bicarbonate: 19.7 mmol/L — ABNORMAL LOW (ref 20.0–28.0)
Bicarbonate: 21.1 mmol/L (ref 20.0–28.0)
FIO2: 100
FIO2: 0.9
LHR: 24 {breaths}/min
MECHVT: 450 mL
Mechanical Rate: 20
O2 SAT: 95.4 %
O2 Saturation: 95.1 %
PATIENT TEMPERATURE: 37
PCO2 ART: 47 mmHg (ref 32.0–48.0)
PCO2 ART: 42 mmHg (ref 32.0–48.0)
PEEP/CPAP: 5 cmH2O
PEEP: 5 cmH2O
PH ART: 7.23 — AB (ref 7.350–7.450)
PO2 ART: 85 mmHg (ref 83.0–108.0)
Patient temperature: 37
VT: 450 mL
pH, Arterial: 7.31 — ABNORMAL LOW (ref 7.350–7.450)
pO2, Arterial: 90 mmHg (ref 83.0–108.0)

## 2018-06-26 LAB — GLUCOSE, CAPILLARY
GLUCOSE-CAPILLARY: 118 mg/dL — AB (ref 70–99)
GLUCOSE-CAPILLARY: 117 mg/dL — AB (ref 70–99)
Glucose-Capillary: 104 mg/dL — ABNORMAL HIGH (ref 70–99)
Glucose-Capillary: 122 mg/dL — ABNORMAL HIGH (ref 70–99)
Glucose-Capillary: 66 mg/dL — ABNORMAL LOW (ref 70–99)
Glucose-Capillary: 81 mg/dL (ref 70–99)
Glucose-Capillary: 83 mg/dL (ref 70–99)
Glucose-Capillary: 88 mg/dL (ref 70–99)
Glucose-Capillary: 93 mg/dL (ref 70–99)

## 2018-06-26 LAB — PHOSPHORUS: PHOSPHORUS: 3.6 mg/dL (ref 2.5–4.6)

## 2018-06-26 LAB — BRAIN NATRIURETIC PEPTIDE: B Natriuretic Peptide: 424 pg/mL — ABNORMAL HIGH (ref 0.0–100.0)

## 2018-06-26 LAB — MAGNESIUM: MAGNESIUM: 1.9 mg/dL (ref 1.7–2.4)

## 2018-06-26 MED ORDER — FENTANYL BOLUS VIA INFUSION
25.0000 ug | INTRAVENOUS | Status: DC | PRN
  Filled 2018-06-26: qty 25

## 2018-06-26 MED ORDER — LEVETIRACETAM 100 MG/ML PO SOLN
500.0000 mg | Freq: Two times a day (BID) | ORAL | Status: DC
  Administered 2018-06-26 – 2018-07-06 (×22): 500 mg
  Filled 2018-06-26 (×24): qty 5

## 2018-06-26 MED ORDER — AMIODARONE LOAD VIA INFUSION
150.0000 mg | Freq: Once | INTRAVENOUS | Status: AC
  Administered 2018-06-26: 150 mg via INTRAVENOUS
  Filled 2018-06-26: qty 83.34

## 2018-06-26 MED ORDER — POTASSIUM CHLORIDE 20 MEQ PO PACK
40.0000 meq | PACK | Freq: Once | ORAL | Status: AC
  Administered 2018-06-26: 40 meq via ORAL
  Filled 2018-06-26: qty 2

## 2018-06-26 MED ORDER — FENTANYL CITRATE (PF) 100 MCG/2ML IJ SOLN
100.0000 ug | Freq: Once | INTRAMUSCULAR | Status: AC
  Administered 2018-06-26: 100 ug via INTRAVENOUS

## 2018-06-26 MED ORDER — ETOMIDATE 2 MG/ML IV SOLN
INTRAVENOUS | Status: AC
  Administered 2018-06-26: 20 mg via INTRAVENOUS
  Filled 2018-06-26: qty 10

## 2018-06-26 MED ORDER — FREE WATER
30.0000 mL | Status: DC
  Administered 2018-06-26 – 2018-06-30 (×25): 30 mL

## 2018-06-26 MED ORDER — FUROSEMIDE 10 MG/ML IJ SOLN
20.0000 mg | Freq: Two times a day (BID) | INTRAMUSCULAR | Status: DC
  Administered 2018-06-26 – 2018-06-27 (×3): 20 mg via INTRAVENOUS
  Filled 2018-06-26 (×3): qty 2

## 2018-06-26 MED ORDER — LEVALBUTEROL HCL 0.63 MG/3ML IN NEBU
0.6300 mg | INHALATION_SOLUTION | Freq: Once | RESPIRATORY_TRACT | Status: AC
  Administered 2018-06-26: 0.63 mg via RESPIRATORY_TRACT
  Filled 2018-06-26: qty 3

## 2018-06-26 MED ORDER — SODIUM CHLORIDE 0.9 % IV SOLN
0.0000 ug/min | INTRAVENOUS | Status: DC
  Administered 2018-06-26: 50 ug/min via INTRAVENOUS
  Administered 2018-06-26: 150 ug/min via INTRAVENOUS
  Filled 2018-06-26: qty 40
  Filled 2018-06-26: qty 4

## 2018-06-26 MED ORDER — DEXTROSE 50 % IV SOLN
1.0000 | Freq: Once | INTRAVENOUS | Status: AC
  Administered 2018-06-26: 50 mL via INTRAVENOUS

## 2018-06-26 MED ORDER — DILTIAZEM HCL 25 MG/5ML IV SOLN
10.0000 mg | INTRAVENOUS | Status: AC
  Administered 2018-06-26: 10 mg via INTRAVENOUS

## 2018-06-26 MED ORDER — FENTANYL 2500MCG IN NS 250ML (10MCG/ML) PREMIX INFUSION
25.0000 ug/h | INTRAVENOUS | Status: DC
  Administered 2018-06-26: 100 ug/h via INTRAVENOUS
  Filled 2018-06-26: qty 250

## 2018-06-26 MED ORDER — LEVALBUTEROL HCL 0.63 MG/3ML IN NEBU
0.6300 mg | INHALATION_SOLUTION | Freq: Four times a day (QID) | RESPIRATORY_TRACT | Status: DC
  Administered 2018-06-26 – 2018-06-28 (×9): 0.63 mg via RESPIRATORY_TRACT
  Filled 2018-06-26 (×9): qty 3

## 2018-06-26 MED ORDER — AMIODARONE HCL IN DEXTROSE 360-4.14 MG/200ML-% IV SOLN
30.0000 mg/h | INTRAVENOUS | Status: DC
  Administered 2018-06-26 – 2018-06-27 (×2): 30 mg/h via INTRAVENOUS
  Filled 2018-06-26 (×2): qty 200

## 2018-06-26 MED ORDER — FUROSEMIDE 10 MG/ML IJ SOLN
40.0000 mg | Freq: Once | INTRAMUSCULAR | Status: AC
  Administered 2018-06-26: 40 mg via INTRAVENOUS
  Filled 2018-06-26: qty 4

## 2018-06-26 MED ORDER — METHYLPREDNISOLONE SODIUM SUCC 125 MG IJ SOLR
80.0000 mg | INTRAMUSCULAR | Status: AC
  Administered 2018-06-26: 80 mg via INTRAVENOUS

## 2018-06-26 MED ORDER — SODIUM BICARBONATE 8.4 % IV SOLN
100.0000 meq | Freq: Once | INTRAVENOUS | Status: AC
  Administered 2018-06-26: 100 meq via INTRAVENOUS
  Filled 2018-06-26: qty 50

## 2018-06-26 MED ORDER — METHYLPREDNISOLONE SODIUM SUCC 125 MG IJ SOLR
INTRAMUSCULAR | Status: AC
  Administered 2018-06-26: 80 mg via INTRAVENOUS
  Filled 2018-06-26: qty 2

## 2018-06-26 MED ORDER — ETOMIDATE 2 MG/ML IV SOLN
20.0000 mg | Freq: Once | INTRAVENOUS | Status: AC
  Administered 2018-06-26: 20 mg via INTRAVENOUS

## 2018-06-26 MED ORDER — FENTANYL CITRATE (PF) 100 MCG/2ML IJ SOLN
50.0000 ug | Freq: Once | INTRAMUSCULAR | Status: AC
  Administered 2018-06-26: 50 ug via INTRAVENOUS
  Filled 2018-06-26: qty 2

## 2018-06-26 MED ORDER — AMIODARONE HCL IN DEXTROSE 360-4.14 MG/200ML-% IV SOLN
60.0000 mg/h | INTRAVENOUS | Status: AC
  Administered 2018-06-26 (×2): 60 mg/h via INTRAVENOUS
  Filled 2018-06-26 (×2): qty 200

## 2018-06-26 MED ORDER — ROCURONIUM BROMIDE 50 MG/5ML IV SOLN
INTRAVENOUS | Status: AC
  Administered 2018-06-26: 50 mg via INTRAVENOUS
  Filled 2018-06-26: qty 1

## 2018-06-26 MED ORDER — FENTANYL CITRATE (PF) 100 MCG/2ML IJ SOLN
INTRAMUSCULAR | Status: AC
  Administered 2018-06-26: 100 ug via INTRAVENOUS
  Filled 2018-06-26: qty 2

## 2018-06-26 MED ORDER — NOREPINEPHRINE 4 MG/250ML-% IV SOLN: 0.0000 ug/min | INTRAVENOUS | Status: DC

## 2018-06-26 MED ORDER — DEXMEDETOMIDINE HCL IN NACL 400 MCG/100ML IV SOLN
0.0000 ug/kg/h | INTRAVENOUS | Status: AC
  Administered 2018-06-26: 0.4 ug/kg/h via INTRAVENOUS
  Administered 2018-06-26: 0.3 ug/kg/h via INTRAVENOUS
  Administered 2018-06-27: 0.5 ug/kg/h via INTRAVENOUS
  Administered 2018-06-28: 0.6 ug/kg/h via INTRAVENOUS
  Filled 2018-06-26 (×3): qty 100

## 2018-06-26 MED ORDER — FUROSEMIDE 10 MG/ML IJ SOLN
INTRAMUSCULAR | Status: AC
  Administered 2018-06-26: 20 mg via INTRAVENOUS
  Filled 2018-06-26: qty 2

## 2018-06-26 MED ORDER — ROCURONIUM BROMIDE 50 MG/5ML IV SOLN
50.0000 mg | Freq: Once | INTRAVENOUS | Status: AC
  Administered 2018-06-26: 50 mg via INTRAVENOUS

## 2018-06-26 MED ORDER — DEXTROSE 50 % IV SOLN
INTRAVENOUS | Status: AC
  Administered 2018-06-26: 50 mL via INTRAVENOUS
  Filled 2018-06-26: qty 50

## 2018-06-26 MED ORDER — DILTIAZEM HCL-DEXTROSE 100-5 MG/100ML-% IV SOLN (PREMIX): 5.0000 mg/h | INTRAVENOUS | Status: DC

## 2018-06-26 MED ORDER — METHYLPREDNISOLONE SODIUM SUCC 40 MG IJ SOLR
40.0000 mg | Freq: Two times a day (BID) | INTRAMUSCULAR | Status: DC
  Administered 2018-06-26 – 2018-06-27 (×2): 40 mg via INTRAVENOUS
  Filled 2018-06-26 (×2): qty 1

## 2018-06-26 MED ORDER — FUROSEMIDE 10 MG/ML IJ SOLN
20.0000 mg | INTRAMUSCULAR | Status: AC
  Administered 2018-06-26: 20 mg via INTRAVENOUS

## 2018-06-26 MED ORDER — VITAL 1.5 CAL PO LIQD
1000.0000 mL | ORAL | Status: DC
  Administered 2018-06-26 – 2018-06-28 (×2): 1000 mL

## 2018-06-26 MED ORDER — DILTIAZEM HCL 25 MG/5ML IV SOLN
INTRAVENOUS | Status: AC
  Administered 2018-06-26: 10 mg via INTRAVENOUS
  Filled 2018-06-26: qty 5

## 2018-06-26 NOTE — Progress Notes (Signed)
Consult placed by ICU RN to assess left upper arm SL PICC d/t swelling in left lower arm. PICC line wnl - no swelling, redness, warmth, or drainage noted around or above insertion site. PICC line flushes well with great blood return. CXR confirms correct placement of PICC. Bilateral arms noted to have pitting edema underneath elbow. RN to elevate arms and continue to monitor.

## 2018-06-26 NOTE — Progress Notes (Signed)
   06/26/18 0230  Clinical Encounter Type  Visited With Patient and family together  Visit Type Initial;Spiritual support;Critical Care  Referral From Nurse  Consult/Referral To Chaplain  Spiritual Encounters  Spiritual Needs Prayer;Other (Comment)   [facility] responded to a Rapid Response page. The patient was moved to ICU after he became more responsive. The patient's wife was present and denied the services of a chaplain. I will follow up if needed.

## 2018-06-26 NOTE — Consult Note (Signed)
Pharmacy Electrolyte Monitoring Consult:  Pharmacy consulted to assist in monitoring and replacing electrolytes in this 72 y.o. male admitted on 06/02/2018 with Urinary Retention  Patient admitted to ICU s/p PEG placement.   Pt with AFib  Labs:  Sodium (mmol/L)  Date Value  06/26/2018 132 (L)   Potassium (mmol/L)  Date Value  06/26/2018 3.7   Magnesium (mg/dL)  Date Value  16/06/9603 1.9   Phosphorus (mg/dL)  Date Value  54/05/8118 3.6   Calcium (mg/dL)  Date Value  14/78/2956 7.6 (L)   Albumin (g/dL)  Date Value  21/30/8657 1.6 (L)   Corrected Calcium: 9.5  Will consider replacement for goal of potassium ~ 4, goal magnesium ~ 2, and goal phos > 2.5. Patient is at risk for refeeding.   Assessment/Plan: Patient currently receiving lasix 20 mg IV q12h  K 3.7 - Will order KCl 40 mEq PO x1  Will repeat labs in AM and supplement accordingly.    Pharmacy will continue to monitor and adjust per consult.   Marty Heck, 06/26/2018 10:32 AM

## 2018-06-26 NOTE — Progress Notes (Signed)
Patient had 2 episodes of vomiting tonight, once during deep suction and the second during a breathing treatment. Tube feeds and IVF stopped due to noticeable work of breathing and swelling in the upper extremities. 40mg  IV lasix given once STAT, BNP drawn STAT, and portable chest x-ray ordered STAT per MD Caryn Bee. After second episode of vomiting, patient's SpO2 lower than previously noted and patient's O2 increased to 6L. Heart rate was also noted to be elevated, so PRN metoprolol IV was given. Rapid Response called due to change in mental status as well as respiratory status. ABG ordered STAT, patient was placed on a non-rebreather with small improvement. Decision was made per MD Marjie Skiff and RRT RN to transfer patient to Stepdown. Bedside report given to receiving RN and all questions/concerns addressed. Lamonte Richer, RN

## 2018-06-26 NOTE — Consult Note (Signed)
Pharmacy Antibiotic Note  Alexander Lara is a 72 y.o. male admitted on 06/13/2018 with pneumonia.  Pharmacy has been consulted for Meropenem dosing. Admitted with on 9/25 for UTI and PNA . Patient underwent EGD on 10/8. Patient had hypoxia after procedure; O2 sats in the 60s and was transferred to ICU.[name] Abdominal x-ray shows large pneumoperitoneum - exploratory laparotomy 10/9. Potential upper GI series tomorrow for gastric repair.  Plan: Continue meropenem 1 g IV Q12h for aspiration PNA  Height: 5\' 11"  (180.3 cm) Weight: 93 lb 9.6 oz (42.5 kg) IBW/kg (Calculated) : 75.3  Temp (24hrs), Avg:97.9 F (36.6 C), Min:97.3 F (36.3 C), Max:99.9 F (37.7 C)  Recent Labs  Lab Jun 28, 2018 2049 06/22/18 0122 06/22/18 0442 06/23/18 0357 06/24/18 0339 06/25/18 0434 06/26/18 0448  WBC  --   --  15.1* 7.9 8.7 9.1 9.6  CREATININE  --   --  0.63 0.63 0.56* 0.54* 0.62  LATICACIDVEN 1.9 1.0  --   --   --   --   --     Estimated Creatinine Clearance: 49.4 mL/min (by C-G formula based on SCr of 0.62 mg/dL).    Allergies  Allergen Reactions  . Penicillins     Lip Swelling  . Zosyn [Piperacillin Sod-Tazobactam So]     Antimicrobials this admission: 10/11 Merrem >> 10/9 Zosyn >> 10/11 10/7 Augmentin >> 10/8 9/30 Unasyn >>10/8 9/28 Flagyl >> 09/30 9/26 Ceftriaxone >> 9/30   Microbiology results: 9/25 BCx: Proteus - pansensitive  9/25 UCx: Proteus mirabilis resistant to Macrobid  10/8 Sputum: abundant WBCs  10/8 MRSA PCR: (-)  Thank you for allowing pharmacy to be a part of this patient's care.  Crist Fat, PharmD, BCPS Clinical Pharmacist 06/26/2018 10:43 AM

## 2018-06-26 NOTE — Progress Notes (Addendum)
Patient ID: Alexander Lara, male   DOB: Sep 08, 1946, 72 y.o.   MRN: [id number]   [employer] PROGRESS NOTE  Alexander Lara HTD:[id number] DOB: 1945-10-25 DOA: 06/11/2018 PCP: [facility]  HPI/Subjective: Patient transferred to telemetry yesterday but again developed respiratory distress, transferred to ICU, intubated this morning. Objective: Vitals:   06/26/18 1115 06/26/18 1130  BP: (!) 129/92 124/84  Pulse: 81 76  Resp: (!) 28 (!) 26  Temp: (!) 97.5 F (36.4 C) 97.7 F (36.5 C)  SpO2: 99% 99%    Filed Weights   06/25/18 0500 06/25/18 1651 06/26/18 0109  Weight: 40.9 kg 43.6 kg 42.5 kg    ROS: Review of Systems  Unable to perform ROS: Acuity of condition   Exam: Physical Exam  Constitutional: He appears cachectic.  HENT:  Nose: No mucosal edema.  Mouth/Throat: No oropharyngeal exudate or posterior oropharyngeal edema.  Eyes: Pupils are equal, round, and reactive to light. Conjunctivae and lids are normal.  Neck: No JVD present. Carotid bruit is not present. No edema present. No thyroid mass and no thyromegaly present.  Cardiovascular: S1 normal and S2 normal. An irregularly irregular rhythm present. Exam reveals no gallop.  No murmur heard. Pulses:      Dorsalis pedis pulses are 2+ on the right side, and 2+ on the left side.  Respiratory: No respiratory distress. He has no wheezes. He has no rhonchi. He has no rales.  GI: Soft. Bowel sounds are normal. There is no tenderness.  Musculoskeletal:       Right ankle: He exhibits no swelling.       Left ankle: He exhibits no swelling.  Lymphadenopathy:    He has no cervical adenopathy.  Neurological: He is unresponsive.  Skin: Skin is warm. No rash noted. Nails show no clubbing.      Data Reviewed: Basic Metabolic Panel: Recent Labs  Lab 06/22/2018 0426  06/22/18 0442 06/23/18 0357 06/23/18 1202 06/24/18 0339 06/25/18 0434 06/26/18 0448  NA 137   < > 137 135  --  138 136 132*  K 3.8   < > 3.8 3.0*  4.3 4.0 3.5 3.7  CL 110   < > 112* 111  --  114* 111 103  CO2 22   < > 20* 18*  --  18* 20* 22  GLUCOSE 93   < > 169* 161*  --  158* 158* 116*  BUN 15   < > 14 11  --  _0 CREATININE 0.60*   < > 0.63 0.63  --  0.56* 0.54* 0.62  CALCIUM 7.5*   < > 7.3* 7.3*  --  7.3* 7.2* 7.6*  MG 2.0  --  1.8 2.1  --  1.8 1.7 1.9  PHOS 2.5  --  2.9  --   --  1.6* 2.0* 3.6   < > = values in this interval not displayed.   CBC: Recent Labs  Lab 06/22/18 0442 06/23/18 0357 06/23/18 2100 06/24/18 0339 06/25/18 0434 06/26/18 0448  WBC 15.1* 7.9  --  8.7 9.1 9.6  NEUTROABS  --  6.8  --   --  8.5* 8.5*  HGB 7.4* 5.6* 9.9* 10.3* 10.1* 13.5  HCT 22.9* 17.5* 29.3* 31.1* 30.2* 41.3  MCV 80.6 81.4  --  81.2 79.9* 82.3  PLT 261 216  --  212 203 268   Cardiac Enzymes:   Recent Results (from the past 240 hour(s))  MRSA PCR Screening     Status: None  Collection Time: 07/12/2018 11:46 AM  Result Value Ref Range Status   MRSA by PCR NEGATIVE NEGATIVE Final    Comment:        The GeneXpert MRSA Assay (FDA approved for NASAL specimens only), is one component of a comprehensive MRSA colonization surveillance program. It is not intended to diagnose MRSA infection nor to guide or monitor treatment for MRSA infections. Performed at [facility] [address] [place], [place] [address]   Culture, respiratory (non-expectorated)     Status: None   Collection Time: 06/20/2018  6:28 PM  Result Value Ref Range Status   Specimen Description   Final    TRACHEAL ASPIRATE Performed at [facility], [address] [place], [place] [address]    Special Requests   Final    NONE Performed at [facility], [address] [place], [place] [address]    Gram Stain   Final    ABUNDANT WBC PRESENT, PREDOMINANTLY PMN ABUNDANT SQUAMOUS EPITHELIAL CELLS PRESENT NO ORGANISMS SEEN Performed at [facility], [place] [address], [place], [place] [address]    Culture RARE CANDIDA  ALBICANS  Final   Report Status 06/24/2018 FINAL  Final     Studies: Dg Abd 1 View  Result Date: 06/26/2018 CLINICAL DATA:  Post intubation. Vomiting. EXAM: ABDOMEN - 1 VIEW COMPARISON:  06/22/2018 FINDINGS: Gastrostomy tube in the left upper quadrant, unchanged in position. Catheter in the pelvis likely representing a rectal catheter. Skin clips along the midline. Improving postoperative pneumoperitoneum since previous study. Gas-filled nondistended colon likely representing ileus. Small bowel distension is resolving. Suggestion of small left pleural effusion. Degenerative changes in the spine and hips. Vascular calcifications. IMPRESSION: Appliances appear to be in satisfactory position. Improving postoperative pneumoperitoneum. Electronically Signed   By: Lucienne Capers M.D.   On: 06/26/2018 04:36   US Venous Img Upper Uni Right  Result Date: 06/24/2018 CLINICAL DATA:  Edema EXAM: RIGHT UPPER EXTREMITY VENOUS DOPPLER ULTRASOUND TECHNIQUE: Gray-scale sonography with graded compression, as well as color Doppler and duplex ultrasound were performed to evaluate the upper extremity deep venous system from the level of the subclavian vein and including the jugular, axillary, basilic and upper cephalic vein. Spectral Doppler was utilized to evaluate flow at rest and with distal augmentation maneuvers. COMPARISON:  None. FINDINGS: Thrombus within deep veins:  None visualized. Compressibility of deep veins:  Normal. Duplex waveform respiratory phasicity:  Normal. Duplex waveform response to augmentation:  Normal. Venous reflux:  None visualized. Other findings: On routine limited images of the contralateral left subclavian vein, incompletely occlusive thrombus is noted around PICC line. IMPRESSION: 1. Negative for right upper extremity DVT. 2. POSITIVE for nonocclusive DVT in the left subclavian vein adjacent to PICC catheter. Electronically Signed   By: Lucrezia Europe M.D.   On: 06/24/2018 12:28   Dg Chest  Port 1 View  Result Date: 06/26/2018 CLINICAL DATA:  Post intubation. Vomiting. EXAM: PORTABLE CHEST 1 VIEW COMPARISON:  06/26/2018 FINDINGS: Endotracheal tube placed with tip measuring 5.5 cm above the carina. Left PICC catheter with tip over the cavoatrial junction region. No pneumothorax. Heart size and pulmonary vascularity are normal. Patchy airspace disease in both mid and lower lungs, greater on the left. This could represent edema, ARDS, or aspiration. Small left pleural effusion. Calcification of the aorta. IMPRESSION: Appliances appear to be in satisfactory position. Bilateral airspace disease in the lungs, greater on the left. This could represent edema, ARDS, or aspiration. Small left pleural effusion. Electronically  Signed   By: Lucienne Capers M.D.   On: 06/26/2018 04:37   Dg Chest Port 1 View  Result Date: 06/26/2018 CLINICAL DATA:  72 year old with history of shortness of breath. History of emphysema. EXAM: PORTABLE CHEST 1 VIEW COMPARISON:  Chest x-ray 06/23/2018. FINDINGS: There is a left upper extremity PICC with tip terminating in the distal superior vena cava. Previously noted endotracheal tube has been withdrawn. Diffuse peribronchial cuffing with patchy multifocal ill-defined airspace consolidation in the lungs bilaterally, most severe throughout the left upper lobe. Moderate left pleural effusion. Small right pleural effusion. Opacity at the left lung base which may reflect atelectasis and/or consolidation. Pulmonary vasculature does not appear engorged. Heart size is normal. Upper mediastinal contours are within normal limits. Aortic atherosclerosis. IMPRESSION: 1. Support apparatus, as above. 2. Worsening bronchitis with developing multilobar bronchopneumonia in the lungs bilaterally (left greater than right). 3. Increasing moderate left and small right pleural effusion. Electronically Signed   By: Vinnie Langton M.D.   On: 06/26/2018 01:58    Scheduled Meds: . chlorhexidine  gluconate (MEDLINE KIT)  15 mL Mouth Rinse BID  . enoxaparin (LOVENOX) injection  1.5 mg/kg Subcutaneous Q24H  . fentaNYL (SUBLIMAZE) injection  50 mcg Intravenous Once  . free water  60 mL Per Tube Q4H  . furosemide  20 mg Intravenous Q12H  . levalbuterol  0.63 mg Nebulization Q6H  . levETIRAcetam  500 mg Per Tube BID  . mouth rinse  15 mL Mouth Rinse 10 times per day  . methylPREDNISolone (SOLU-MEDROL) injection  40 mg Intravenous Q12H  . multivitamin  15 mL Per Tube Daily  . sodium chloride flush  10-40 mL Intracatheter Q12H  . thiamine  100 mg Per Tube Daily  . vitamin C  250 mg Per Tube BID   Continuous Infusions: . sodium chloride 250 mL (06/26/18 0930)  . amiodarone 30 mg/hr (06/26/18 1054)  . dexmedetomidine (PRECEDEX) IV infusion Stopped (06/26/18 0856)  . famotidine (PEPCID) IV 20 mg (06/26/18 0944)  . feeding supplement (JEVITY 1.5 CAL/FIBER) 1,000 mL (06/26/18 0950)  . fentaNYL infusion INTRAVENOUS Stopped (06/26/18 0828)  . meropenem (MERREM) IV 1 g (06/26/18 1136)  . norepinephrine (LEVOPHED) Adult infusion    . phenylephrine (NEO-SYNEPHRINE) Adult infusion 110 mcg/min (06/26/18 1145)    Assessment/Plan:   1. Acute respiratory failure post surgery extubated, reintubated again this morning.  Status post gastric laceration status post gastrorrhaphy and gastrostomy.  Tolerating tube feeds 2.  Proteus sepsis from indwelling Foley catheter.  Blood cultures and urine cultures positive.  Foley catheter changed.  This will have to be changed every 4 weeks.  3. Clostridium, Proteus in the blood: Continue meropenem..  Family decided against colonoscopy at this point.  Appreciate palliative care team following the patient, patient's son will be arriving tomorrow, because patient condition change and had to be reintubated now in ICU.  Recommend comfort measures. 4. failed swallow evaluation.  Status post surgery for gastric tube, tolerating the tube feeding. 5. chronic aspiration  pneumonia ;contineu abx 6. Atrial fibrillation with rapid ventricular response.  Amiodarone drip  now.   7. History of seizures: On Keppra 8. Failure to thrive, cachexia and malnutrition.  Overall prognosis is poor.  Patient is a DNR.  Now his condition is even worsened post procedure, patient's son is coming from [place] tomorrow, that time we will discuss about possible comfort care options.  At present continue full treatment. 9. BPH.   #11 , severe malnutrition in the context of chronic  illness, continue tube feeding.  Code Status: Partial code    Code Status Orders  (From admission, onward)         Start     Ordered   06/10/18 1120  Do not attempt resuscitation (DNR)  Continuous    Question Answer Comment  In the event of cardiac or respiratory ARREST Do not call a "code blue"   In the event of cardiac or respiratory ARREST Do not perform Intubation, CPR, defibrillation or ACLS   In the event of cardiac or respiratory ARREST Use medication by any route, position, wound care, and other measures to relive pain and suffering. May use oxygen, suction and manual treatment of airway obstruction as needed for comfort.      06/10/18 1121        Code Status History    Date Active Date Inactive Code Status Order ID Comments User Context   06/09/2018 0209 06/10/2018 1121 DNR [id number]  Arta Silence, MD ED   05/15/2018 1120 05/18/2018 2303 Full Code [id number]  Dustin Flock, MD Inpatient   05/15/2018 0125 05/15/2018 1120 Full Code [id number]  Amelia Jo, MD Inpatient    Advance Directive Documentation     Most Recent Value  Type of Advance Directive  Healthcare Power of Attorney  Pre-existing out of facility DNR order (yellow form or pink MOST form)  -  "MOST" Form in Place?  -     Disposition Plan:   Antibiotics:  Unasyn  Time spent: 25 minutes.    Epifanio Lesches  [employer]

## 2018-06-26 NOTE — Progress Notes (Addendum)
PULMONARY / CRITICAL CARE MEDICINE   Name: Alexander Lara MRN: [id number] DOB: [birth year]    ADMISSION DATE:  06/06/2018 CONSULTATION DATE:  06/26/2018  REFERRING MD:  Dr. Bonna Gains  CHIEF COMPLAINT:  Acute Hypoxic Respiratory Failure  SHORT DISCUSSION: 72 y.o. Male admitted with Proteus UTI , Bacteremia, and sepsis from chronic indwelling foley.  Pt has a history of Chronic subdural hematoma, chronic aspiration, and severe malnutrition.  On 10/8 he underwent EGD for PEG placement, of which attempt was aborted due to blade entering the stomach.  Post procedure pt developed severe Acute Hypoxic Respiratory Failure necessitating intubation.  Follow up CXR with LLL infiltrate concerning for aspiration.  Follow up abdominal x-ray with large acute pneumoperitoneum concerning for possible abdominal perforation. Therefore, surgery consulted pt underwent gastrorrhaphy and gastrostomy he remained intubated postop.   REVIEW OF SYSTEMS:   Unable to assess pt intubated   SUBJECTIVE:  Unable to assess pt intubated     VITAL SIGNS: BP (!) 141/67 (BP Location: Right Arm)   Pulse 65   Temp 97.8 F (36.6 C) (Oral)   Resp (!) 30   Ht _0  (1.803 m)   Wt 42.5 kg   SpO2 93%   BMI 13.05 kg/m   HEMODYNAMICS:    VENTILATOR SETTINGS:    INTAKE / OUTPUT: I/O last 3 completed shifts: In: 2806.3 [I.V.:1975.3; NG/GT:280; IV Piggyback:550.9] Out: 3450 [Urine:3450]  PHYSICAL EXAMINATION: General: acutely ill appearing male, NAD mechanically intubated  Neuro: sedated not following commands, PERRL  HEENT: supple, JVD present  Cardiovascular: irregular irregular, no R/G Lungs: diffuse crackles and rhonchi throughout, even, non labored  Abdomen: +BS x4, soft, non distended, LUQ G tube present dressing dry and intact  Musculoskeletal: severely cachetic  Skin: midline abdominal incision with honeycomb dressing intact  LABS:  BMET Recent Labs  Lab 06/23/18 0357 06/23/18 1202 06/24/18 0339  06/25/18 0434  NA 135  --  138 136  K 3.0* 4.3 4.0 3.5  CL 111  --  114* 111  CO2 18*  --  18* 20*  BUN 11  --  10 9  CREATININE 0.63  --  0.56* 0.54*  GLUCOSE 161*  --  158* 158*    Electrolytes Recent Labs  Lab 06/22/18 0442 06/23/18 0357 06/24/18 0339 06/25/18 0434  CALCIUM 7.3* 7.3* 7.3* 7.2*  MG 1.8 2.1 1.8 1.7  PHOS 2.9  --  1.6* 2.0*    CBC Recent Labs  Lab 06/23/18 0357 06/23/18 2100 06/24/18 0339 06/25/18 0434  WBC 7.9  --  8.7 9.1  HGB 5.6* 9.9* 10.3* 10.1*  HCT 17.5* 29.3* 31.1* 30.2*  PLT 216  --  212 203    Coag's Recent Labs  Lab 06/20/18 0453  INR 1.16    Sepsis Markers Recent Labs  Lab 06/17/2018 2049 06/22/18 0122 06/22/18 0442 06/23/18 0357  LATICACIDVEN 1.9 1.0  --   --   PROCALCITON  --  0.17 0.58 0.97    ABG Recent Labs  Lab 07/12/2018 1632 06/22/18 0433 06/26/18 0247  PHART 7.51* 7.46* 7.46*  PCO2ART 22* 25* 24*  PO2ART 40* 353* 49*    Liver Enzymes Recent Labs  Lab 06/19/18 1859 06/23/18 1202  AST 18  --   ALT 15  --   ALKPHOS 45  --   BILITOT 0.4  --   ALBUMIN 2.1* 1.6*    Cardiac Enzymes Recent Labs  Lab 06/22/18 0442  TROPONINI <0.03    Glucose Recent Labs  Lab 06/24/18 2335 06/25/18 0357  06/25/18 0745 06/25/18 2054 06/26/18 0000 06/26/18 0258  GLUCAP 148* 135* 133* 86 93 66*    Imaging Dg Chest Port 1 View  Result Date: 06/26/2018 CLINICAL DATA:  72 year old with history of shortness of breath. History of emphysema. EXAM: PORTABLE CHEST 1 VIEW COMPARISON:  Chest x-ray 06/23/2018. FINDINGS: There is a left upper extremity PICC with tip terminating in the distal superior vena cava. Previously noted endotracheal tube has been withdrawn. Diffuse peribronchial cuffing with patchy multifocal ill-defined airspace consolidation in the lungs bilaterally, most severe throughout the left upper lobe. Moderate left pleural effusion. Small right pleural effusion. Opacity at the left lung base which may  reflect atelectasis and/or consolidation. Pulmonary vasculature does not appear engorged. Heart size is normal. Upper mediastinal contours are within normal limits. Aortic atherosclerosis. IMPRESSION: 1. Support apparatus, as above. 2. Worsening bronchitis with developing multilobar bronchopneumonia in the lungs bilaterally (left greater than right). 3. Increasing moderate left and small right pleural effusion. Electronically Signed   By: Vinnie Langton M.D.   On: 06/26/2018 01:58   STUDIES: . CT  Chest / Abdomen / Pelvis 06/13/18>> Patchy densities in the right lower lobe and inferior right upper lobe, compatible with incompletely resolved pneumonia. Follow-up chest radiographs are recommended until this completely clears. Mild-to-moderate changes of COPD. Small pericardial effusion. Multiple dependent calculi in the urinary bladder. 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  CT Head wo Contrast 06/16/18>> No acute intracranial abnormalities. 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 KUB 06/24/2018>> Large pneumoperitoneum. Metal clips overlying the stomach presumably related to gastrostomy tube placement.  CULTURES: Blood 9/25>> Proteus Mirabilis (pan sensitive), Clostridium species Urine 9/25>> Proteus Mirabilis (resistant to Nitrofurantoin) Sputum 10/8>> Rare candida albicans   ANTIBIOTICS: Rocephin 9/25>> 9/30 Flagyl 9/28>> 9/30 Augmentin 10/7>>10/7 Unasyn 9/30>>10/8 Zosyn 10/8>>10/11 Vancomycin 10/8>>10/8 Meropenum 10/11>>  SIGNIFICANT EVENTS: 9/25>> Admission to [facility] 10/8>> EGD with attempt at PEG tube, aborted due to blade entering the stomach  10/8>> hypoxic post procedure requiring intubation 10/8>> large neumoperitoneum on CXR with questionable abdominal perforation 10/9>> underwent gastrorrhaphy and gastrostomy due to gastric laceration during PEG tube placement attempt  10/10>>  Pt successfully intubated  10/12>> Pt transferred out of ICU to telemetry unit  10/13>> Pt transferred back to ICU with respiratory failure secondary to aspiration requiring mechanical intubation   LINES/TUBES: ETT 07/10/2018>>10/10 Left Brachial PICC 06/13/18>> ETT 10/13>>  ASSESSMENT / PLAN:  Acute hypoxic respiratory failure secondary to pneumonia and pulmonary edema  Edema of lower lip possible allergic reaction-resolved  Mechanical ventilation  Hx: Chronic aspiration and Emphysema  Full vent support for now-vent settings reviewed and established SBT once all parameters met  Scheduled and prn bronchodilator therapy  IV steroids   Additional 20 mg iv lasix x1 dose  VAP bundle implemented  Atrial fibrillation with rvr  LUE non occlusive subclavian DVT associated with PICC-finding on [place] 06/24/18  Continuous telemetry monitoring  10 mg iv cardizem x1 dose  Subq lovenox   Hypomagnesia Trend BMP  Replace electrolytes as indicated Monitor UOP  Anemia without acute blood loss Trend CBC  Monitor for s/sx of bleeding and transfuse for hgb <7  Gastric laceration s/p gastrorrhaphy and gastrostomy 06/22/18 Vomiting  Severe malnutrition related to chronic illness  Continue TF's for now  Continue bowel regimen  KUB pending  SUP px: pepcid   Severe sepsis/proteus UTI/bacteremia  Aspiration pneumonia with bibasilar infiltrates (left greater than right) Trend WBC and monitor fever  curve  Trend PCT Continue meropenum   Mechanical intubation discomfort/pain  Maintain RASS goal 0 to -1 Fentanyl and precedex gtt to maintain RASS goal  WUA daily   -Palliative care consulted working with pts family to discuss goals of treatment.  Pts fiance updated regarding decline in pts condition and need for reintubation all questions answered.  I will contact pts son [name] to inform him of decline in his fathers condition and need for reintubation.   Marda Stalker, [place] Pager [phone] (please enter 7 digits) PCCM Consult Pager [phone] (please enter 7 digits)

## 2018-06-26 NOTE — Progress Notes (Signed)
Attempted to contact pts son Cristobal Advani to inform him of decline in pts condition requiring transfer back to ICU and mechanical intubation.  He did not answer my phone call, therefore I left a voicemail message to return my phone call.  Sonda Rumble, AGNP  Pulmonary/Critical Care Pager [phone] (please enter 7 digits) PCCM Consult Pager [phone] (please enter 7 digits)

## 2018-06-26 NOTE — Procedures (Signed)
Endotracheal Intubation Procedure Note  Indication for endotracheal intubation: respiratory failure. Airway Assessment: Mallampati Class: II (hard and soft palate, upper portion of tonsils anduvula visible). Sedation: etomidate and fentanyl. Paralytic: rocuronium. Lidocaine: no. Atropine: no. Equipment: glidescope utilized . Cricoid Pressure: no. Number of attempts: 1. ETT location confirmed by by auscultation, by CXR and ETCO2 monitor.  Lakesia Dahle, AGNP  Pulmonary/Critical Care Pager [phone] (please enter 7 digits) PCCM Consult Pager [phone] (please enter 7 digits)  

## 2018-06-26 NOTE — Progress Notes (Signed)
PT Cancellation Note  Patient Details Name: Alexander Lara MRN: [id number] DOB: 06/18/1946   Cancelled Treatment:    Reason Eval/Treat Not Completed: Medical issues which prohibited therapy(Rapid reponse called this date, pt moved to SDU. WIll wait until pt is more medically stable for PT eval. Please place new PT order once pt is appropriate. )  10:01 AM, 06/26/18 Rosamaria Lints, PT, DPT Physical Therapist - [facility]  [phone] (ASCOM)    Buccola,[name] 06/26/2018, 10:01 AM

## 2018-06-26 NOTE — Significant Event (Signed)
Rapid Response Event Note  Overview: Rapid response called to room 242 at 2:24      Initial Focused Assessment:  Pt found lying in bed, on nonrebreather.  Pt having increased work of breathing, sats in the 80's.  Heart rate was elevated. Pt was minimally responsive.  An ABG was done and orders were placed for pt to be transferred to ICU bed 7.  Fiance at bedside.   Interventions:  Pt brought to ICU bed 7  Plan of Care (if not transferred):  Event Summary:   at      at          [facility]

## 2018-06-26 NOTE — Progress Notes (Signed)
Nutrition Follow-up  DOCUMENTATION CODES:   Severe malnutrition in context of chronic illness  INTERVENTION:   Vital 1.5 @ goal rate of 3m/hr- Initiate at 268mhr and increase by 1032mr every 12 hrs until goal rate is reached.   Free water 50m41m4 hours  Regimen provides 1440kcal/day, 65g/day protein 1093ml36m free water   Liquid MVI daily  Vitamin C 250mg 59mvia tube   Pt at refeeding risk; recommend monitor K, Mg and P labs daily   NUTRITION DIAGNOSIS:   Severe Malnutrition related to chronic illness(emphysema ) as evidenced by severe fat depletion, severe muscle depletion.  Ongoing.  GOAL:   Patient will meet greater than or equal to 90% of their needs -met with TFs  MONITOR:   Labs, Weight trends, TF tolerance, Skin, I & O's  ASSESSMENT:   [facility] for urinary retention w/ foley catheter in place and failure to thrive. Previous admit 8/31PMH: emphysema, seizures, subdural heamtoma   Pt s/p reintubation today after rapid response called; patient had 2 episodes of vomiting overnight, once during deep suction and the second during a breathing treatment. Tube feeds held initially but patient was started back on Jevity this morning. Abdominal x-rays today report "Gas-filled nondistended colon likely representing ileus. Small bowel distension is resolving." Will change patient to Vital 1.5 as this is an elemental formula and easier to digest. Pt initiated on lasix; weight down 3lbs this morning. Awaiting GOC discussion with son who is supposed to arrive from [place].    Enteral Access: 16 Fr. G-tube placed surgically on 10/9; patient also has NGT in place but plan is to remove it  Medications reviewed and include: lovenox, fentanyl, lasix, solu-medrol, MVI, thiamine, vitamin C, precedex, pepcid, meropenem, neosynephrine, zofran   Labs reviewed: Na 132(L), K 3.7 wnl, P 3.6, Mg 1.9 wnl cbgs- 93, 81, 66, 88, 83 x 24 hrs  Diet Order:    Diet Order            Diet NPO time specified  Diet effective now             EDUCATION NEEDS:   No education needs have been identified at this time  Skin:  Skin Assessment: Skin Integrity Issues: Skin Integrity Issues:: Stage I, Incisions Stage I: mid sacrum Incisions: closed incision to abdomen  Last BM:  10/11- type 7  Height:   Ht Readings from Last 1 Encounters:  06/25/18 _0  (1.803 m)    Weight:   Wt Readings from Last 1 Encounters:  06/26/18 42.5 kg    Ideal Body Weight:  78.2 kg  BMI:  Body mass index is 13.05 kg/m.  Estimated Nutritional Needs:   Kcal:  1500-1800kcal/day   Protein:  54-60 grams  Fluid:  1 L/day  Alexander Mazon Koleen DistanceD, Alexander Lara Pager #- [phone] 8986e#- [phone] Pager: [phone]

## 2018-06-27 ENCOUNTER — Inpatient Hospital Stay: Payer: Medicare Other

## 2018-06-27 DIAGNOSIS — E43 Unspecified severe protein-calorie malnutrition: Secondary | ICD-10-CM

## 2018-06-27 DIAGNOSIS — J690 Pneumonitis due to inhalation of food and vomit: Secondary | ICD-10-CM

## 2018-06-27 LAB — GLUCOSE, CAPILLARY
GLUCOSE-CAPILLARY: 127 mg/dL — AB (ref 70–99)
Glucose-Capillary: 80 mg/dL (ref 70–99)

## 2018-06-27 LAB — PHOSPHORUS: PHOSPHORUS: 3 mg/dL (ref 2.5–4.6)

## 2018-06-27 LAB — BASIC METABOLIC PANEL
ANION GAP: 11 (ref 5–15)
BUN: 18 mg/dL (ref 8–23)
CO2: 25 mmol/L (ref 22–32)
Calcium: 7.4 mg/dL — ABNORMAL LOW (ref 8.9–10.3)
Chloride: 99 mmol/L (ref 98–111)
Creatinine, Ser: 0.7 mg/dL (ref 0.61–1.24)
GFR calc Af Amer: >60 mL/min (ref >60)
GLUCOSE: 145 mg/dL — AB (ref 70–99)
POTASSIUM: 3.8 mmol/L (ref 3.5–5.1)
Sodium: 135 mmol/L (ref 135–145)

## 2018-06-27 LAB — CBC WITH DIFFERENTIAL/PLATELET
BAND NEUTROPHILS: 7 %
BASOS PCT: 0 %
Basophils Absolute: 0 10*3/uL (ref 0.0–0.1)
Eosinophils Absolute: 0 10*3/uL (ref 0.0–0.5)
Eosinophils Relative: 0 %
HEMATOCRIT: 29.2 % — AB (ref 39.0–52.0)
HEMOGLOBIN: 9.7 g/dL — AB (ref 13.0–17.0)
LYMPHS ABS: 0.5 10*3/uL — AB (ref 0.7–4.0)
Lymphocytes Relative: 6 %
MCH: 26.9 pg (ref 26.0–34.0)
MCHC: 33.2 g/dL (ref 30.0–36.0)
MCV: 80.9 fL (ref 80.0–100.0)
MONO ABS: 0 10*3/uL — AB (ref 0.1–1.0)
MONOS PCT: 0 %
NEUTROS ABS: 7.1 10*3/uL (ref 1.7–7.7)
Neutrophils Relative %: 87 %
Platelets: 137 10*3/uL — ABNORMAL LOW (ref 150–400)
RBC: 3.61 MIL/uL — ABNORMAL LOW (ref 4.22–5.81)
RDW: 19.9 % — ABNORMAL HIGH (ref 11.5–15.5)
WBC: 7.5 10*3/uL (ref 4.0–10.5)

## 2018-06-27 LAB — MAGNESIUM: MAGNESIUM: 1.6 mg/dL — AB (ref 1.7–2.4)

## 2018-06-27 LAB — OCCULT BLOOD X 1 CARD TO LAB, STOOL: FECAL OCCULT BLD: POSITIVE — AB

## 2018-06-27 MED ORDER — FENTANYL CITRATE (PF) 100 MCG/2ML IJ SOLN
25.0000 ug | INTRAMUSCULAR | Status: DC | PRN
  Administered 2018-06-27: 25 ug via INTRAVENOUS
  Administered 2018-06-28 – 2018-06-30 (×2): 50 ug via INTRAVENOUS
  Filled 2018-06-27 (×3): qty 2

## 2018-06-27 MED ORDER — POTASSIUM CHLORIDE 20 MEQ PO PACK
20.0000 meq | PACK | Freq: Once | ORAL | Status: AC
  Administered 2018-06-27: 20 meq via ORAL
  Filled 2018-06-27: qty 1

## 2018-06-27 MED ORDER — FAMOTIDINE 20 MG PO TABS
20.0000 mg | ORAL_TABLET | Freq: Two times a day (BID) | ORAL | Status: DC
  Administered 2018-06-27 – 2018-07-06 (×19): 20 mg
  Filled 2018-06-27 (×19): qty 1

## 2018-06-27 MED ORDER — METOPROLOL TARTRATE 25 MG PO TABS
12.5000 mg | ORAL_TABLET | Freq: Two times a day (BID) | ORAL | Status: DC
  Administered 2018-06-27 – 2018-07-06 (×19): 12.5 mg
  Filled 2018-06-27: qty 0.5
  Filled 2018-06-27 (×20): qty 1

## 2018-06-27 MED ORDER — MAGNESIUM SULFATE 2 GM/50ML IV SOLN
2.0000 g | Freq: Once | INTRAVENOUS | Status: AC
  Administered 2018-06-27: 2 g via INTRAVENOUS
  Filled 2018-06-27: qty 50

## 2018-06-27 MED ORDER — FUROSEMIDE 20 MG PO TABS
40.0000 mg | ORAL_TABLET | Freq: Two times a day (BID) | ORAL | Status: DC
  Administered 2018-06-27 – 2018-06-30 (×5): 40 mg
  Filled 2018-06-27 (×8): qty 2

## 2018-06-27 MED ORDER — MAGNESIUM SULFATE 4 GM/100ML IV SOLN
4.0000 g | Freq: Once | INTRAVENOUS | Status: AC
  Administered 2018-06-27: 4 g via INTRAVENOUS
  Filled 2018-06-27: qty 100

## 2018-06-27 MED ORDER — PREDNISONE 20 MG PO TABS
40.0000 mg | ORAL_TABLET | Freq: Two times a day (BID) | ORAL | Status: DC
  Administered 2018-06-27 – 2018-06-28 (×2): 40 mg
  Filled 2018-06-27 (×2): qty 2

## 2018-06-27 NOTE — Plan of Care (Addendum)
Received a call from Collie Siad CM with request to call son [name]. Attempted to call x2 without success. VM left with phone number for palliative medicine team.

## 2018-06-27 NOTE — Progress Notes (Addendum)
Daily Progress Note   Patient Name: Alexander Lara       Date: 06/27/2018 DOB: 07-Sep-1946  Age: 72 y.o. MRN#: [id number] Attending Physician: Alexander Lesches, MD Primary Care Physician: [facility] Date: 05/18/2018  Reason for Consultation/Follow-up: Psychosocial/spiritual support  Subjective: Patient is resting in bed with ventilator in place. No family at bedside. Per CCM staff, son did not arrive yesterday. Spoke with CM/SW, patient has VA benefits, and working to get patient to [place] facility. The facility has palliative and hospice there. Discussed that  there are 3 brothers all of whom are equal decision makers if there is no POA papers. Alexander Lara is listed on all of the [place] paperwork as the emergency contact.    Length of Stay: 18  Current Medications: Scheduled Meds:  . chlorhexidine gluconate (MEDLINE KIT)  15 mL Mouth Rinse BID  . enoxaparin (LOVENOX) injection  1.5 mg/kg Subcutaneous Q24H  . famotidine  20 mg Per Tube BID  . free water  30 mL Per Tube Q4H  . furosemide  40 mg Per Tube BID  . levalbuterol  0.63 mg Nebulization Q6H  . levETIRAcetam  500 mg Per Tube BID  . mouth rinse  15 mL Mouth Rinse 10 times per day  . metoprolol tartrate  12.5 mg Per Tube BID  . multivitamin  15 mL Per Tube Daily  . predniSONE  40 mg Per Tube BID  . sodium chloride flush  10-40 mL Intracatheter Q12H  . thiamine  100 mg Per Tube Daily  . vitamin C  250 mg Per Tube BID    Continuous Infusions: . sodium chloride 250 mL (06/26/18 0930)  . dexmedetomidine (PRECEDEX) IV infusion 0.4 mcg/kg/hr (06/27/18 0630)  . feeding supplement (VITAL 1.5 CAL) 1,000 mL (06/26/18 1221)  . fentaNYL infusion INTRAVENOUS Stopped (06/26/18 0828)  . magnesium sulfate 1 - 4 g bolus IVPB 4 g  (06/27/18 1100)  . meropenem (MERREM) IV Stopped (06/26/18 2137)  . norepinephrine (LEVOPHED) Adult infusion    . phenylephrine (NEO-SYNEPHRINE) Adult infusion 10 mcg/min (06/27/18 0630)    PRN Meds: sodium chloride, acetaminophen, albuterol, fentaNYL, metoprolol tartrate, [DISCONTINUED] ondansetron **OR** ondansetron (ZOFRAN) IV, polyvinyl alcohol, sodium chloride, sodium chloride flush  Physical Exam  Constitutional: No distress.  Pulmonary/Chest:  Ventilator  Vital Signs: BP (!) 89/64   Pulse 63   Temp 98.8 F (37.1 C)   Resp (!) 27   Ht '5\' 11"'  (1.803 m)   Wt 40.5 kg   SpO2 97%   BMI 12.45 kg/m  SpO2: SpO2: 97 % O2 Device: O2 Device: Ventilator O2 Flow Rate: O2 Flow Rate (L/min): 60 L/min  Intake/output summary:   Intake/Output Summary (Last 24 hours) at 06/27/2018 1202 Last data filed at 06/27/2018 1100 Gross per 24 hour  Intake 1025.02 ml  Output 2250 ml  Net -1224.98 ml   LBM: Last BM Date: 06/27/18 Baseline Weight: Weight: 37.5 kg Most recent weight: Weight: 40.5 kg       Palliative Assessment/Data:    Flowsheet Rows     Most Recent Value  Intake Tab  Referral Department  Hospitalist  Unit at Time of Referral  Cardiac/Telemetry Unit  Palliative Care Primary Diagnosis  Sepsis/Infectious Disease  Date Notified  06/09/18  Palliative Care Type  New Palliative care  Reason for referral  Clarify Goals of Care, Counsel Regarding Hospice  Date of Admission  05/19/2018  Date first seen by Palliative Care  06/09/18  # of days Palliative referral response time  0 Day(s)  # of days IP prior to Palliative referral  1  Clinical Assessment  Psychosocial & Spiritual Assessment  Palliative Care Outcomes      Patient Active Problem List   Diagnosis Date Noted  . Pressure injury of skin 07/08/2018  . Recurrent UTI (urinary tract infection) 06/09/2018  . Adult failure to thrive   . Palliative care by specialist   . Severe protein-calorie malnutrition  ([place]) 05/17/2018  . Sepsis ([place]) 05/14/2018    Palliative Care Assessment & Plan    Recommendations/Plan:  Continue care. Plans to work toward D/C at a [place] facility as he has VA benefits. Per staff, son Alexander Lara is not coming. Alexander Lara has 2 brothers. Recommend attempting to speak with the brothers to offer decision making capacity if they would like.     Code Status:    Code Status Orders  (From admission, onward)         Start     Ordered   06/24/18 0440  Limited resuscitation (code)  Continuous    Question Answer Comment  In the event of cardiac or respiratory ARREST: Initiate Code Blue, Call Rapid Response No   In the event of cardiac or respiratory ARREST: Perform CPR No   In the event of cardiac or respiratory ARREST: Perform Intubation/Mechanical Ventilation Yes   In the event of cardiac or respiratory ARREST: Use NIPPV/BiPAp only if indicated Yes   In the event of cardiac or respiratory ARREST: Administer ACLS medications if indicated No   In the event of cardiac or respiratory ARREST: Perform Defibrillation or Cardioversion if indicated No      06/24/18 0439        Code Status History    Date Active Date Inactive Code Status Order ID Comments User Context   06/10/2018 1121 06/24/2018 0439 DNR [id number]  Alexander Gowda, NP Inpatient   06/09/2018 0209 06/10/2018 1121 DNR [id number]  Arta Silence, MD ED   05/15/2018 1120 05/18/2018 2303 Full Code [id number]  Dustin Flock, MD Inpatient   05/15/2018 0125 05/15/2018 1120 Full Code [id number]  Amelia Jo, MD Inpatient    Advance Directive Documentation     Most Recent Value  Type of Advance Directive  Healthcare Power of Attorney  Pre-existing out of facility DNR order (yellow form  or pink MOST form)  -  "MOST" Form in Place?  -       Prognosis:   Unable to determine  Discharge Planning:  To Be Determined  Care plan was discussed with Alexander Dyer NP with CCM  Thank you for allowing the Palliative Medicine Team to assist  in the care of this patient.   Total Time 15 min Prolonged Time Billed no      Greater than 50%  of this time was spent counseling and coordinating care related to the above assessment and plan.  Alexander Gowda, NP  Please contact Palliative Medicine Team phone at [phone] for questions and concerns.

## 2018-06-27 NOTE — Progress Notes (Signed)
   Subjective:    Patient ID: Alexander Lara, male    DOB: [birth year], 72 y.o.   MRN: [id number]  HPI He looks pitiful on the ventilator. Emaciated gentleman on the ventilator. Very weak, tries to reach for ET tube. Synchronous with a ventilator. Still requiring 50% Fi02.   Review of Systems  Unable to perform ROS: Acuity of condition   Ventilator Settings: Vent Mode: PRVC FiO2 (%):  [50 %] 50 % Set Rate:  [24 bmp] 24 bmp Vt Set:  [450 mL] 450 mL PEEP:  [5 cmH20] 5 cmH20 Plateau Pressure:  [16 cmH20-21 cmH20] 21 cmH20   Intake/Output: No intake/output data recorded.   Vitals: Vitals:   06/27/18 1900 06/27/18 1913  BP: 117/72   Pulse:    Resp: (!) 26   Temp: 98.4 F (36.9 C)   SpO2:  97%       Objective:   Physical Exam  General: acutely ill appearing male, emaciated, intubated, mechanically ventilated   Neuro: sedated not following commands, PERRL  HEENT: supple, JVD present  Cardiovascular: irregular irregular, no R/G Lungs: diffuse crackles and rhonchi throughout, even, non labored  Abdomen: +BS x4, soft, non distended, LUQ G tube present dressing dry and intact  Musculoskeletal: severely cachetic,he has contractures on the lower extremities. Hardly any muscle mass. Skin: midline abdominal incision with honeycomb dressing intact      Assessment & Plan:  Acute hypoxic respiratory failure secondary to pneumonia and pulmonary edema  Edema of lower lip possible allergic reaction-resolved  Mechanical ventilation  Hx: Chronic aspiration and Emphysema  Full vent support for now-vent settings reviewed and established SBT once all parameters met, he is to debilitated and still requiring 50% FiO2 to attempt SBT. Scheduled and prn bronchodilator therapy  IV steroids   Additional 20 mg iv lasix x1 dose  VAP bundle implemented and followed no vent changes possible today.  Atrial fibrillation with rvr  LUE non occlusive subclavian DVT associated with PICC-finding on [place]  06/24/18  Continuous telemetry monitoring  10 mg iv cardizem x1 dose  Subq lovenox   Hypomagnesia Trend BMP  Replace electrolytes as indicated Monitor UOP  Anemia without acute blood loss Trend CBC  Monitor for s/sx of bleeding and transfuse for hgb <7  Gastric laceration s/p gastrorrhaphy and gastrostomy 06/22/18 Vomiting  Severe malnutrition related to chronic illness  Continue TF's for now  Continue bowel regimen  SUP px: pepcid   Severe sepsis/proteus UTI/bacteremia  Aspiration pneumonia with bibasilar infiltrates (left greater than right) Trend WBC and monitor fever curve  Trend PCT Continue meropenem   Mechanical intubation discomfort/pain  Maintain RASS goal 0 to -1 on Precedex, prn Fentanyl.

## 2018-06-27 NOTE — Consult Note (Signed)
Pharmacy Electrolyte Monitoring Consult:  Pharmacy consulted to assist in monitoring and replacing electrolytes in this 72 y.o. male admitted on 06/15/2018 with Urinary Retention  Patient admitted to ICU s/p PEG placement.   Pt with AFib  Labs:  Sodium (mmol/L)  Date Value  06/27/2018 135   Potassium (mmol/L)  Date Value  06/27/2018 3.8   Magnesium (mg/dL)  Date Value  91/47/8295 1.6 (L)   Phosphorus (mg/dL)  Date Value  62/13/0865 3.0   Calcium (mg/dL)  Date Value  78/46/9629 7.4 (L)   Albumin (g/dL)  Date Value  52/84/1324 1.6 (L)   Corrected Calcium: 9.6   Assessment/Plan: Patient transitioned to furosemide 40 mg VT q12h.   Will order magnesium 4g IV x 1 and potassium VT x 1.   Goal of potassium ~ 4, goal magnesium ~ 2, and goal phos > 2.5. Patient is at risk for refeeding.  Will repeat labs in AM and supplement accordingly.   Pharmacy will continue to monitor and adjust per consult.   Tosh Glaze L, 06/27/2018 4:28 PM

## 2018-06-27 NOTE — Progress Notes (Signed)
PHARMACIST - PHYSICIAN COMMUNICATION  CONCERNING: IV to Oral Route Change Policy  RECOMMENDATION: This patient is receiving famotidine by the intravenous route.  Based on criteria approved by the Pharmacy and Therapeutics Committee, the intravenous medication(s) is/are being converted to the equivalent oral dose form(s).   DESCRIPTION: These criteria include:  The patient is eating (either orally or via tube) and/or has been taking other orally administered medications for a least 24 hours  The patient has no evidence of active gastrointestinal bleeding or impaired GI absorption (gastrectomy, short bowel, patient on TNA or NPO).  If you have questions about this conversion, please contact the Pharmacy Department  []   [phone] )  [facility] [x]   [phone] )  [facility] []   [phone] )  [facility] []   [phone] )  [facility] []   [phone] )  [facility]   Alexander Lara, [facility] 06/27/2018 9:45 AM
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
## 2018-06-27 NOTE — Progress Notes (Addendum)
Patient ID: Alexander Lara, male   DOB: [birth year], 72 y.o.   MRN: [id number]   [employer] PROGRESS NOTE  Alexander Lara JJH:[id number] DOB: 10-05-1945 DOA: 06/01/2018 PCP: [facility]  HPI/Subjective: Currently intubated, sedated, on high-dose pressors, amiodarone drip.     Objective: Vitals:   06/27/18 1000 06/27/18 1100  BP: 106/61 (!) 89/64  Pulse:    Resp: (!) 26 (!) 27  Temp: 98.6 F (37 C) 98.8 F (37.1 C)  SpO2:      Filed Weights   06/25/18 1651 06/26/18 0109 06/27/18 0500  Weight: 43.6 kg 42.5 kg 40.5 kg    ROS: Review of Systems  Unable to perform ROS: Acuity of condition   Exam: Physical Exam  Constitutional: He appears cachectic.  HENT:  Nose: No mucosal edema.  Mouth/Throat: No oropharyngeal exudate or posterior oropharyngeal edema.  Eyes: Pupils are equal, round, and reactive to light. Conjunctivae and lids are normal.  Neck: No JVD present. Carotid bruit is not present. No edema present. No thyroid mass and no thyromegaly present.  Cardiovascular: S1 normal and S2 normal. An irregularly irregular rhythm present. Exam reveals no gallop.  No murmur heard. Pulses:      Dorsalis pedis pulses are 2+ on the right side, and 2+ on the left side.  Respiratory: No respiratory distress. He has no wheezes. He has no rhonchi. He has no rales.  GI: Soft. Bowel sounds are normal. There is no tenderness.  Musculoskeletal:       Right ankle: He exhibits no swelling.       Left ankle: He exhibits no swelling.  Lymphadenopathy:    He has no cervical adenopathy.  Neurological: He is unresponsive.  Skin: Skin is warm. No rash noted. Nails show no clubbing.  Psychiatric:  Intubated and sedated      Data Reviewed: Basic Metabolic Panel: Recent Labs  Lab 06/22/18 0442 06/23/18 0357 06/23/18 1202 06/24/18 0339 06/25/18 0434 06/26/18 0448 06/27/18 0519  NA 137 135  --  138 136 132* 135  K 3.8 3.0* 4.3 4.0 3.5 3.7 3.8  CL 112* 111  --  114* 111  103 99  CO2 20* 18*  --  18* 20* 22 25  GLUCOSE 169* 161*  --  158* 158* 116* 145*  BUN 14 11  --  _0 CREATININE 0.63 0.63  --  0.56* 0.54* 0.62 0.70  CALCIUM 7.3* 7.3*  --  7.3* 7.2* 7.6* 7.4*  MG 1.8 2.1  --  1.8 1.7 1.9 1.6*  PHOS 2.9  --   --  1.6* 2.0* 3.6 3.0   CBC: Recent Labs  Lab 06/23/18 0357 06/23/18 2100 06/24/18 0339 06/25/18 0434 06/26/18 0448 06/27/18 0519  WBC 7.9  --  8.7 9.1 9.6 7.5  NEUTROABS 6.8  --   --  8.5* 8.5* 7.1  HGB 5.6* 9.9* 10.3* 10.1* 13.5 9.7*  HCT 17.5* 29.3* 31.1* 30.2* 41.3 29.2*  MCV 81.4  --  81.2 79.9* 82.3 80.9  PLT 216  --  212 203 268 137*   Cardiac Enzymes:   Recent Results (from the past 240 hour(s))  MRSA PCR Screening     Status: None   Collection Time: 06/25/2018 11:46 AM  Result Value Ref Range Status   MRSA by PCR NEGATIVE NEGATIVE Final    Comment:        The GeneXpert MRSA Assay (FDA approved for NASAL specimens only), is one component of a comprehensive MRSA colonization surveillance  program. It is not intended to diagnose MRSA infection nor to guide or monitor treatment for MRSA infections. Performed at [facility], [address] [place], [place] [address]   Culture, respiratory (non-expectorated)     Status: None   Collection Time: 07/08/2018  6:28 PM  Result Value Ref Range Status   Specimen Description   Final    TRACHEAL ASPIRATE Performed at [facility], [address] [place], [place] [address]    Special Requests   Final    NONE Performed at [facility], [address] [place], [place] [address]    Gram Stain   Final    ABUNDANT WBC PRESENT, PREDOMINANTLY PMN ABUNDANT SQUAMOUS EPITHELIAL CELLS PRESENT NO ORGANISMS SEEN Performed at [facility], [place] [address] [place], [name] [address]    Culture RARE CANDIDA ALBICANS  Final   Report Status 06/24/2018 FINAL  Final     Studies: Dg Abd 1 View  Result Date: 06/27/2018 CLINICAL DATA:  Vomiting  EXAM: ABDOMEN - 1 VIEW COMPARISON:  06/26/2018; 06/22/2018; CT abdomen and pelvis-06/13/2018 FINDINGS: Re-demonstrated mild patulous distension of the colon, grossly unchanged. No definite evidence of enteric obstruction. No supine evidence of pneumoperitoneum. No pneumatosis or portal venous gas. Gastrostomy tube overlies expected location of mid body of the stomach. Rectal tube overlies expected location of the rectum. No definitive abnormal intra-abdominal calcifications Limited visualization of lower thorax suggests lung hyperexpansion with potential trace bilateral effusions, incompletely evaluated. Re-demonstrated age-indeterminate compression deformities of the T12 and L2 vertebral bodies, incompletely evaluated. IMPRESSION: Similar findings most suggestive of ileus. Electronically Signed   By: Sandi Mariscal M.D.   On: 06/27/2018 07:05   Dg Abd 1 View  Result Date: 06/26/2018 CLINICAL DATA:  Post intubation. Vomiting. EXAM: ABDOMEN - 1 VIEW COMPARISON:  06/22/2018 FINDINGS: Gastrostomy tube in the left upper quadrant, unchanged in position. Catheter in the pelvis likely representing a rectal catheter. Skin clips along the midline. Improving postoperative pneumoperitoneum since previous study. Gas-filled nondistended colon likely representing ileus. Small bowel distension is resolving. Suggestion of small left pleural effusion. Degenerative changes in the spine and hips. Vascular calcifications. IMPRESSION: Appliances appear to be in satisfactory position. Improving postoperative pneumoperitoneum. Electronically Signed   By: Lucienne Capers M.D.   On: 06/26/2018 04:36   Dg Chest Port 1 View  Result Date: 06/27/2018 CLINICAL DATA:  Acute respiratory failure EXAM: PORTABLE CHEST 1 VIEW COMPARISON:  06/26/2018; 06/23/2018; 06/22/2018 FINDINGS: Grossly unchanged cardiac silhouette and mediastinal contours with atherosclerotic plaque within the thoracic aorta. Stable position of support apparatus. The  lungs remain hyperexpanded with flattening the bilaterally diaphragms. Ill-defined heterogeneous interstitial and potential developing airspace opacities within the left mid and lower lung as well as the right mid lung are unchanged. No definite evidence of edema. There is blunting of the bilateral costophrenic angles suggestive of trace bilateral effusions. No pneumothorax. No definite acute osseus abnormalities. IMPRESSION: 1.  Stable positioning of support apparatus.  No pneumothorax. 2. Grossly unchanged bilateral airspace opacities, left greater than right, with broad differential considerations including aspiration and/or atypical infection. 3. Unchanged trace bilateral effusions. Electronically Signed   By: Sandi Mariscal M.D.   On: 06/27/2018 07:07   Dg Chest Port 1 View  Result Date: 06/26/2018 CLINICAL DATA:  Post intubation. Vomiting. EXAM: PORTABLE CHEST 1 VIEW COMPARISON:  06/26/2018 FINDINGS: Endotracheal tube placed with tip measuring 5.5 cm above the carina. Left PICC catheter with tip over the cavoatrial junction region. No pneumothorax. Heart size  and pulmonary vascularity are normal. Patchy airspace disease in both mid and lower lungs, greater on the left. This could represent edema, ARDS, or aspiration. Small left pleural effusion. Calcification of the aorta. IMPRESSION: Appliances appear to be in satisfactory position. Bilateral airspace disease in the lungs, greater on the left. This could represent edema, ARDS, or aspiration. Small left pleural effusion. Electronically Signed   By: Lucienne Capers M.D.   On: 06/26/2018 04:37   Dg Chest Port 1 View  Result Date: 06/26/2018 CLINICAL DATA:  72 year old with history of shortness of breath. History of emphysema. EXAM: PORTABLE CHEST 1 VIEW COMPARISON:  Chest x-ray 06/23/2018. FINDINGS: There is a left upper extremity PICC with tip terminating in the distal superior vena cava. Previously noted endotracheal tube has been withdrawn. Diffuse  peribronchial cuffing with patchy multifocal ill-defined airspace consolidation in the lungs bilaterally, most severe throughout the left upper lobe. Moderate left pleural effusion. Small right pleural effusion. Opacity at the left lung base which may reflect atelectasis and/or consolidation. Pulmonary vasculature does not appear engorged. Heart size is normal. Upper mediastinal contours are within normal limits. Aortic atherosclerosis. IMPRESSION: 1. Support apparatus, as above. 2. Worsening bronchitis with developing multilobar bronchopneumonia in the lungs bilaterally (left greater than right). 3. Increasing moderate left and small right pleural effusion. Electronically Signed   By: Vinnie Langton M.D.   On: 06/26/2018 01:58    Scheduled Meds: . chlorhexidine gluconate (MEDLINE KIT)  15 mL Mouth Rinse BID  . enoxaparin (LOVENOX) injection  1.5 mg/kg Subcutaneous Q24H  . famotidine  20 mg Per Tube BID  . free water  30 mL Per Tube Q4H  . furosemide  40 mg Per Tube BID  . levalbuterol  0.63 mg Nebulization Q6H  . levETIRAcetam  500 mg Per Tube BID  . mouth rinse  15 mL Mouth Rinse 10 times per day  . metoprolol tartrate  12.5 mg Per Tube BID  . multivitamin  15 mL Per Tube Daily  . predniSONE  40 mg Per Tube BID  . sodium chloride flush  10-40 mL Intracatheter Q12H  . thiamine  100 mg Per Tube Daily  . vitamin C  250 mg Per Tube BID   Continuous Infusions: . sodium chloride 250 mL (06/26/18 0930)  . dexmedetomidine (PRECEDEX) IV infusion 0.4 mcg/kg/hr (06/27/18 0630)  . feeding supplement (VITAL 1.5 CAL) 1,000 mL (06/26/18 1221)  . fentaNYL infusion INTRAVENOUS Stopped (06/26/18 0828)  . magnesium sulfate 1 - 4 g bolus IVPB 4 g (06/27/18 1100)  . meropenem (MERREM) IV Stopped (06/26/18 2137)  . norepinephrine (LEVOPHED) Adult infusion    . phenylephrine (NEO-SYNEPHRINE) Adult infusion 10 mcg/min (06/27/18 0630)    Assessment/Plan:   1. Acute respiratory failure status post  extubation, reintubation, now critically ill with full vent support, sedation, amiodarone drip, Levophed drip.,  Chest x-ray today showed bilateral consolidation continue antibiotics, bronchodilators, prednisone 2. gastrostomy.  Tolerating tube feeds, abdominal x-ray showed ileus, 3.  Proteus sepsis from indwelling Foley catheter.  Blood cultures and urine cultures positive. Clostridium, Proteus in the blood: Continue meropenem..  Family decided against colonoscopy at this point.  Appreciate palliative care team following the patient, patient's son will be arriving tomorrow, because patient condition change and had to be reintubated now in ICU.  Recommend comfort measures. 4. failed swallow evaluation.  Status post surgery for gastric tube, tolerating the tube feeding. 5. chronic aspiration pneumonia ;contineu abx 6. Atrial fibrillation with rapid ventricular response.  Amiodarone drip  now.  7. History of seizures: On Keppra 8. Failure to thrive, cachexia and malnutrition.  Overall prognosis is poor.  Patient is a DNR.  Now his condition is even worsened post procedure, patient's son is coming from [place] tomorrow, that time we will discuss about possible comfort care options.  At present continue full treatment. 9. BPH.   #11 , severe malnutrition in the context of chronic illness, continue tube feeding.  Code Status: Partial code    Code Status Orders  (From admission, onward)         Start     Ordered   06/10/18 1120  Do not attempt resuscitation (DNR)  Continuous    Question Answer Comment  In the event of cardiac or respiratory ARREST Do not call a "code blue"   In the event of cardiac or respiratory ARREST Do not perform Intubation, CPR, defibrillation or ACLS   In the event of cardiac or respiratory ARREST Use medication by any route, position, wound care, and other measures to relive pain and suffering. May use oxygen, suction and manual treatment of airway obstruction as needed  for comfort.      06/10/18 1121        Code Status History    Date Active Date Inactive Code Status Order ID Comments User Context   06/09/2018 0209 06/10/2018 1121 DNR [id number]  Arta Silence, MD ED   05/15/2018 1120 05/18/2018 2303 Full Code [id number]  Dustin Flock, MD Inpatient   05/15/2018 0125 05/15/2018 1120 Full Code [id number]  Amelia Jo, MD Inpatient    Advance Directive Documentation     Most Recent Value  Type of Advance Directive  Healthcare Power of Attorney  Pre-existing out of facility DNR order (yellow form or pink MOST form)  -  "MOST" Form in Place?  -     Disposition Plan:   Antibiotics:  Unasyn  Time spent: 25 minutes.    Epifanio Lesches  [employer]

## 2018-06-27 NOTE — Consult Note (Signed)
Pharmacy Antibiotic Note  Alexander Lara is a 72 y.o. male admitted on 06/07/2018 with pneumonia.  Pharmacy has been consulted for Meropenem dosing. Admitted with on 9/25 for UTI and PNA . Patient underwent EGD on 10/8. Patient had hypoxia after procedure; O2 sats in the 60s and was transferred to ICU.[name] Abdominal x-ray shows large pneumoperitoneum - exploratory laparotomy 10/9. Potential upper GI series tomorrow for gastric repair.  Plan: Continue meropenem 1 g IV Q12h for aspiration PNA. Patient is on day 6 of broad aspiration coverage (Zosyn and Meropenem).   Height: 5\' 11"  (180.3 cm) Weight: 89 lb 4.6 oz (40.5 kg) IBW/kg (Calculated) : 75.3  Temp (24hrs), Avg:99.5 F (37.5 C), Min:98.4 F (36.9 C), Max:100 F (37.8 C)  Recent Labs  Lab 06/26/2018 2049 06/22/18 0122  06/23/18 0357 06/24/18 0339 06/25/18 0434 06/26/18 0448 06/27/18 0519  WBC  --   --    < > 7.9 8.7 9.1 9.6 7.5  CREATININE  --   --    < > 0.63 0.56* 0.54* 0.62 0.70  LATICACIDVEN 1.9 1.0  --   --   --   --   --   --    < > = values in this interval not displayed.    Estimated Creatinine Clearance: 47.1 mL/min (by C-G formula based on SCr of 0.7 mg/dL).    Allergies  Allergen Reactions  . Penicillins     Lip Swelling  . Zosyn [Piperacillin Sod-Tazobactam So]     Antimicrobials this admission: 10/11 Merrem >> 10/9 Zosyn >> 10/11 10/7 Augmentin >> 10/8 9/30 Unasyn >>10/8 9/28 Flagyl >> 09/30 9/26 Ceftriaxone >> 9/30   Microbiology results: 9/25 BCx: Proteus - pansensitive  9/25 UCx: Proteus mirabilis resistant to Macrobid  10/8 Sputum: rare candida albicans  10/8 MRSA PCR: (-)  Thank you for allowing pharmacy to be a part of this patient's care.  MLS 06/27/2018 4:25 PM

## 2018-06-28 ENCOUNTER — Inpatient Hospital Stay
Admit: 2018-06-28 | Discharge: 2018-06-28 | Disposition: A | Discharge status: Home / Self Care | Payer: Medicare Other | Attending: Pulmonary Disease | Admitting: Pulmonary Disease

## 2018-06-28 ENCOUNTER — Inpatient Hospital Stay: Payer: Medicare Other

## 2018-06-28 ENCOUNTER — Encounter: Payer: Self-pay | Admitting: General Surgery

## 2018-06-28 LAB — BLOOD GAS, ARTERIAL
ACID-BASE EXCESS: 5 mmol/L — AB (ref 0.0–2.0)
BICARBONATE: 27.6 mmol/L (ref 20.0–28.0)
FIO2: 0.4
O2 Saturation: 95.7 %
PCO2 ART: 33 mmHg (ref 32.0–48.0)
PEEP: 5 cmH2O
PH ART: 7.53 — AB (ref 7.350–7.450)
Patient temperature: 37
Pressure support: 7 cmH2O
pO2, Arterial: 70 mmHg — ABNORMAL LOW (ref 83.0–108.0)

## 2018-06-28 LAB — CBC WITH DIFFERENTIAL/PLATELET
BAND NEUTROPHILS: 7 %
Basophils Absolute: 0 10*3/uL (ref 0.0–0.1)
Basophils Relative: 0 %
EOS ABS: 0 10*3/uL (ref 0.0–0.5)
EOS PCT: 0 %
HCT: 31.3 % — ABNORMAL LOW (ref 39.0–52.0)
HEMOGLOBIN: 10.4 g/dL — AB (ref 13.0–17.0)
Lymphocytes Relative: 5 %
Lymphs Abs: 0.6 10*3/uL — ABNORMAL LOW (ref 0.7–4.0)
MCH: 27 pg (ref 26.0–34.0)
MCHC: 33.2 g/dL (ref 30.0–36.0)
MCV: 81.3 fL (ref 80.0–100.0)
Monocytes Absolute: 0.1 10*3/uL (ref 0.1–1.0)
Monocytes Relative: 1 %
NEUTROS ABS: 11.1 10*3/uL — AB (ref 1.7–7.7)
Neutrophils Relative %: 87 %
Platelets: 145 10*3/uL — ABNORMAL LOW (ref 150–400)
RBC: 3.85 MIL/uL — AB (ref 4.22–5.81)
RDW: 19.9 % — AB (ref 11.5–15.5)
WBC: 11.8 10*3/uL — AB (ref 4.0–10.5)
nRBC: 0 % (ref 0.0–0.2)

## 2018-06-28 LAB — BASIC METABOLIC PANEL
Anion gap: 10 (ref 5–15)
BUN: 22 mg/dL (ref 8–23)
CALCIUM: 7.8 mg/dL — AB (ref 8.9–10.3)
CHLORIDE: 98 mmol/L (ref 98–111)
CO2: 29 mmol/L (ref 22–32)
Creatinine, Ser: 0.78 mg/dL (ref 0.61–1.24)
GFR calc Af Amer: >60 mL/min (ref >60)
GFR calc non Af Amer: >60 mL/min (ref >60)
Glucose, Bld: 127 mg/dL — ABNORMAL HIGH (ref 70–99)
Potassium: 3 mmol/L — ABNORMAL LOW (ref 3.5–5.1)
SODIUM: 137 mmol/L (ref 135–145)

## 2018-06-28 LAB — MAGNESIUM: Magnesium: 2.4 mg/dL (ref 1.7–2.4)

## 2018-06-28 MED ORDER — IPRATROPIUM BROMIDE 0.02 % IN SOLN
0.5000 mg | Freq: Four times a day (QID) | RESPIRATORY_TRACT | Status: DC
  Administered 2018-06-28 – 2018-07-01 (×11): 0.5 mg via RESPIRATORY_TRACT
  Filled 2018-06-28 (×13): qty 2.5

## 2018-06-28 MED ORDER — PREDNISONE 20 MG PO TABS
40.0000 mg | ORAL_TABLET | Freq: Every day | ORAL | Status: DC
  Administered 2018-06-29 – 2018-06-30 (×2): 40 mg
  Filled 2018-06-28: qty 2
  Filled 2018-06-28: qty 4
  Filled 2018-06-28: qty 2

## 2018-06-28 MED ORDER — POTASSIUM CHLORIDE 10 MEQ/50ML IV SOLN
10.0000 meq | INTRAVENOUS | Status: AC
  Administered 2018-06-28 (×4): 10 meq via INTRAVENOUS
  Filled 2018-06-28 (×4): qty 50

## 2018-06-28 MED ORDER — FUROSEMIDE 10 MG/ML IJ SOLN
40.0000 mg | Freq: Once | INTRAMUSCULAR | Status: AC
  Administered 2018-06-28: 40 mg via INTRAVENOUS
  Filled 2018-06-28: qty 4

## 2018-06-28 MED ORDER — POTASSIUM CHLORIDE 20 MEQ PO PACK
30.0000 meq | PACK | ORAL | Status: AC
  Administered 2018-06-28 (×2): 30 meq
  Filled 2018-06-28 (×2): qty 2

## 2018-06-28 NOTE — Progress Notes (Signed)
*  PRELIMINARY RESULTS* Echocardiogram 2D Echocardiogram has been performed.  Alexander Lara 06/28/2018, 2:57 PM

## 2018-06-28 NOTE — Progress Notes (Signed)
Patient had blood in stool. Occult sent and positive. Annabelle Harman, NP notified, lovenox d/c'd. Trudee Kuster

## 2018-06-28 NOTE — Progress Notes (Signed)
Patient had 2 instances of HR dropping into 30-50's. Annabelle Harman, NP notified. No new orders currently. Trudee Kuster

## 2018-06-28 NOTE — Care Management (Signed)
RNCM has text [name] again to see if he was able to reach out to [facility] Dr. Sherryll Burger. No answer.

## 2018-06-28 NOTE — Progress Notes (Signed)
PULMONARY / CRITICAL CARE MEDICINE   Name: Alexander Lara MRN: [id number] DOB: 12-01-1945    ADMISSION DATE:  06/12/2018 CONSULTATION DATE:  07-02-18  REFERRING MD:  Dr. Maximino Greenland  CHIEF COMPLAINT:  Acute Hypoxic Respiratory Failure  SHORT DISCUSSION: 72 y.o. Male admitted with Proteus UTI , Bacteremia, and sepsis from chronic indwelling foley.  Pt has a history of Chronic subdural hematoma, chronic aspiration, and severe malnutrition.  On 10/8 he underwent EGD for PEG placement, of which attempt was aborted due to blade entering the stomach.  Post procedure pt developed severe Acute Hypoxic Respiratory Failure necessitating intubation.  Follow up CXR with LLL infiltrate concerning for aspiration.  Follow up abdominal x-ray with large acute pneumoperitoneum concerning for possible abdominal perforation. Therefore, surgery consulted pt underwent gastrorrhaphy and gastrostomy he remained intubated postop.    CULTURES: Blood 9/25>> Proteus Mirabilis (pan sensitive), Clostridium species Urine 9/25>> Proteus Mirabilis (resistant to Nitrofurantoin) Sputum 10/8>> Rare candida albicans   ANTIBIOTICS: Rocephin 9/25>> 9/30 Flagyl 9/28>> 9/30 Augmentin 10/7>>10/7 Unasyn 9/30>>10/8 Zosyn 10/8>>10/11 Vancomycin 10/8>>10/8 Meropenum 10/11>>  SIGNIFICANT EVENTS: 9/25>> Admission to [facility] 10/8>> EGD with attempt at PEG tube, aborted due to blade entering the stomach  10/8>> hypoxic post procedure requiring intubation 10/8>> large neumoperitoneum on CXR with questionable abdominal perforation 10/9>> underwent gastrorrhaphy and gastrostomy due to gastric laceration during PEG tube placement attempt  10/10>> Pt successfully intubated  10/12>> Pt transferred out of ICU to telemetry unit  10/13>> Pt transferred back to ICU with respiratory failure secondary to aspiration requiring mechanical intubation 1014: Stool occult positive Lovenox was held  LINES/TUBES: ETT 07/02/18>>10/10 Left Brachial PICC  06/13/18>> ETT 10/13>> REVIEW OF SYSTEMS:   Unable to assess pt intubated   SUBJECTIVE:  Patient is currently intubated but able to open eyes follow simple commands - Currently placed on pressure support with PEEP - Currently on Precedex -Was treated for Proteus sensitive sepsis - Potassium 3.0 and replaced -Patient is 11 L positive - No echo identified in the system  VITAL SIGNS: BP (!) 143/92   Pulse 79   Temp 98.2 F (36.8 C) (Rectal)   Resp 16   Ht 5\' 11"  (1.803 m)   Wt 40.7 kg   SpO2 100%   BMI 12.51 kg/m   HEMODYNAMICS:    VENTILATOR SETTINGS: Vent Mode: PRVC FiO2 (%):  [50 %] 50 % Set Rate:  [24 bmp] 24 bmp Vt Set:  [450 mL] 450 mL PEEP:  [5 cmH20] 5 cmH20 Plateau Pressure:  [13 cmH20-21 cmH20] 16 cmH20  INTAKE / OUTPUT: I/O last 3 completed shifts: In: 1559.2 [I.V.:431.1; NG/GT:440; IV Piggyback:688.1] Out: 2950 [Urine:2950]  PHYSICAL EXAMINATION: General: acutely ill appearing male, NAD mechanically intubated  Neuro: sedated not following commands, PERRL  HEENT: supple, JVD present  Cardiovascular: irregular irregular, no R/G Lungs: diffuse crackles and rhonchi throughout, even, non labored  Abdomen: +BS x4, soft, non distended, LUQ G tube present dressing dry and intact  Musculoskeletal: severely cachetic  Skin: midline abdominal incision with honeycomb dressing intact  LABS:  BMET Recent Labs  Lab 06/26/18 0448 06/27/18 0519 06/28/18 0436  NA 132* 135 137  K 3.7 3.8 3.0*  CL 103 99 98  CO2 22 25 29   BUN 11 18 22   CREATININE 0.62 0.70 0.78  GLUCOSE 116* 145* 127*    Electrolytes Recent Labs  Lab 06/25/18 0434 06/26/18 0448 06/27/18 0519 06/28/18 0436  CALCIUM 7.2* 7.6* 7.4* 7.8*  MG 1.7 1.9 1.6*  --   PHOS 2.0* 3.6 3.0  --  CBC Recent Labs  Lab 06/26/18 0448 06/27/18 0519 06/28/18 0436  WBC 9.6 7.5 11.8*  HGB 13.5 9.7* 10.4*  HCT 41.3 29.2* 31.3*  PLT 268 137* 145*    Coag's No results for input(s): APTT, INR in  the last 168 hours.  Sepsis Markers Recent Labs  Lab 16-Jul-2018 2049 06/22/18 0122 06/22/18 0442 06/23/18 0357  LATICACIDVEN 1.9 1.0  --   --   PROCALCITON  --  0.17 0.58 0.97    ABG Recent Labs  Lab 06/26/18 0247 06/26/18 0500 06/26/18 0915  PHART 7.46* 7.23* 7.31*  PCO2ART 24* 47 42  PO2ART 49* 90 85    Liver Enzymes Recent Labs  Lab 06/23/18 1202  ALBUMIN 1.6*    Cardiac Enzymes Recent Labs  Lab 06/22/18 0442  TROPONINI <0.03    Glucose Recent Labs  Lab 06/26/18 1145 06/26/18 1549 06/26/18 1936 06/26/18 2321 06/27/18 0353 06/27/18 0721  GLUCAP 118* 122* 104* 117* 80 127*    Imaging No results found. STUDIES: . CT  Chest / Abdomen / Pelvis 06/13/18>> Patchy densities in the right lower lobe and inferior right upper lobe, compatible with incompletely resolved pneumonia. Follow-up chest radiographs are recommended until this completely clears. Mild-to-moderate changes of COPD. Small pericardial effusion. Multiple dependent calculi in the urinary bladder. 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  CT Head wo Contrast 06/16/18>> No acute intracranial abnormalities. 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 KUB 16-Jul-2018>> Large pneumoperitoneum. Metal clips overlying the stomach presumably related to gastrostomy tube placement.    ASSESSMENT / PLAN:  Acute hypoxic respiratory failure secondary to pneumonia and pulmonary edema  Edema of lower lip possible allergic reaction-resolved  Mechanical ventilation  Hx: Chronic aspiration and Emphysema  -Placed on SBT pressure support of 7 with PEEP of 5 we will repeat blood gas in 1 over and decide Scheduled and prn bronchodilator therapy  -Currently on p.o. prednisone -Give Lasix 40 today - Get echocardiogram -VAP bundle implemented  Atrial fibrillation with rvr  LUE non occlusive subclavian DVT associated  with PICC-finding on [place] 06/24/18  Continuous telemetry monitoring  Doing well currently on metoprolol Subq lovenox-on held due to stool occult positive -Monitor if doing well consider resumption  Hypomagnesia Trend BMP-recheck magnesium pending Replace electrolytes as indicated-potassium replaced Monitor UOP  Anemia without acute blood loss Trend CBC  Monitor for s/sx of bleeding and transfuse for hgb <7  Gastric laceration s/p gastrorrhaphy and gastrostomy 06/22/18 Vomiting  Severe malnutrition related to chronic illness  Continue TF's for now  Continue bowel regimen  SUP px: pepcid -Feeding on hold for weaning  Severe sepsis/proteus UTI/bacteremia  Aspiration pneumonia with bibasilar infiltrates (left greater than right) Trend WBC and monitor fever curve  Trend PCT Continue meropenum- last on from 07-16-18 - Repeat blood cultures if negative consider stopping the antibiotics after total of 2 weeks -If blood cultures continue to remain positive consider ID consultation  Mechanical intubation discomfort/pain  Maintain RASS goal 0 to -1 -Currently on Precedex doing well WUA daily   -Palliative care consulted working with pts family to discuss goals of treatment.  Pts fiance updated regarding decline in pts condition and need for reintubation all questions answered.     Skin/Wound: Chronic changes  Electrolytes: Replace electrolytes per ICU electrolyte replacement protocol.   IVF: none  Nutrition: Tube feeds as tolerated  Prophylaxis: DVT Prophylaxis with Lovenox on held,. GI Prophylaxis.   Restraints: Soft limb  PT/OT eval and treat. OOB when appropriate.   Lines/Tubes: 928 Foley 930 PICC line central line.  ADVANCE DIRECTIVE: Partial code  FAMILY DISCUSSION: Palliative is going to speak with the son today to decide goals of care  Quality Care: PPI, DVT prophylaxis, HOB elevated, Infection control all reviewed and addressed.  Events and notes from last 24  hours reviewed. Care plan discussed on multidisciplinary rounds  CC TIME: 40 minutes   Old records reviewed discussed results and management plan with patient  Images personally reviewed and results and labs reviewed and discussed with patient.  All medication reviewed and adjusted  Further management depending on test results and work up as outlined above.    Roseanne Reno, M.D

## 2018-06-28 NOTE — Progress Notes (Signed)
End of shift note:  Patients girl friend [name] stated she spoke with the patients son and says he will not make any decisions about his father until he is here and can see his father. [name] says his son is delayed but does not have an estimated time of arrival.

## 2018-06-28 NOTE — Progress Notes (Signed)
Patient ID: Alexander Lara, male   DOB: 1945-11-26, 72 y.o.   MRN: [id number]   [employer] PROGRESS NOTE  Alexander Lara SUP:[id number] DOB: November 02, 1945 DOA: 06/07/2018 PCP: [facility]  HPI/Subjective: Still intubated, sedated.  On pressors. Objective: Vitals:   06/28/18 1000 06/28/18 1030  BP: (!) 143/87 (!) 161/83  Pulse:    Resp: 15 18  Temp: 98.6 F (37 C) 98.8 F (37.1 C)  SpO2:      Filed Weights   06/26/18 0109 06/27/18 0500 06/28/18 0444  Weight: 42.5 kg 40.5 kg 40.7 kg    ROS: Review of Systems  Unable to perform ROS: Acuity of condition  Eyes: Negative for redness.   Exam: Physical Exam  Constitutional: He appears cachectic.  HENT:  Nose: No mucosal edema.  Mouth/Throat: No oropharyngeal exudate or posterior oropharyngeal edema.  Eyes: Pupils are equal, round, and reactive to light. Conjunctivae and lids are normal.  Neck: No JVD present. Carotid bruit is not present. No edema present. No thyroid mass and no thyromegaly present.  Cardiovascular: S1 normal and S2 normal. An irregularly irregular rhythm present. Exam reveals no gallop.  No murmur heard. Pulses:      Dorsalis pedis pulses are 2+ on the right side, and 2+ on the left side.  Respiratory: No respiratory distress. He has no wheezes. He has no rhonchi. He has no rales.  GI: Soft. Bowel sounds are normal. There is no tenderness.  Musculoskeletal:       Right ankle: He exhibits no swelling.       Left ankle: He exhibits no swelling.  Lymphadenopathy:    He has no cervical adenopathy.  Neurological: He is unresponsive.  Skin: Skin is warm. No rash noted. Nails show no clubbing.      Data Reviewed: Basic Metabolic Panel: Recent Labs  Lab 06/22/18 0442  06/24/18 0339 06/25/18 0434 06/26/18 0448 06/27/18 0519 06/28/18 0436  NA 137   < > 138 136 132* 135 137  K 3.8   < > 4.0 3.5 3.7 3.8 3.0*  CL 112*   < > 114* 111 103 99 98  CO2 20*   < > 18* 20* '22 25 29  ' GLUCOSE 169*   <  > 158* 158* 116* 145* 127*  BUN 14   < > '10 9 11 18 22  ' CREATININE 0.63   < > 0.56* 0.54* 0.62 0.70 0.78  CALCIUM 7.3*   < > 7.3* 7.2* 7.6* 7.4* 7.8*  MG 1.8   < > 1.8 1.7 1.9 1.6* 2.4  PHOS 2.9  --  1.6* 2.0* 3.6 3.0  --    < > = values in this interval not displayed.   CBC: Recent Labs  Lab 06/23/18 0357  06/24/18 0339 06/25/18 0434 06/26/18 0448 06/27/18 0519 06/28/18 0436  WBC 7.9  --  8.7 9.1 9.6 7.5 11.8*  NEUTROABS 6.8  --   --  8.5* 8.5* 7.1 11.1*  HGB 5.6*   < > 10.3* 10.1* 13.5 9.7* 10.4*  HCT 17.5*   < > 31.1* 30.2* 41.3 29.2* 31.3*  MCV 81.4  --  81.2 79.9* 82.3 80.9 81.3  PLT 216  --  212 203 268 137* 145*   < > = values in this interval not displayed.   Cardiac Enzymes:   Recent Results (from the past 240 hour(s))  MRSA PCR Screening     Status: None   Collection Time: 06/29/2018 11:46 AM  Result Value Ref Range Status  MRSA by PCR NEGATIVE NEGATIVE Final    Comment:        The GeneXpert MRSA Assay (FDA approved for NASAL specimens only), is one component of a comprehensive MRSA colonization surveillance program. It is not intended to diagnose MRSA infection nor to guide or monitor treatment for MRSA infections. Performed at [facility], [address] [place], [place] [address]   Culture, respiratory (non-expectorated)     Status: None   Collection Time: 06/20/2018  6:28 PM  Result Value Ref Range Status   Specimen Description   Final    TRACHEAL ASPIRATE Performed at [facility], [address], [place], [place] [address]    Special Requests   Final    NONE Performed at [facility], [address] [place], [place] [address]    Gram Stain   Final    ABUNDANT WBC PRESENT, PREDOMINANTLY PMN ABUNDANT SQUAMOUS EPITHELIAL CELLS PRESENT NO ORGANISMS SEEN Performed at [facility], [place] [address], [place], [place] [address]    Culture RARE CANDIDA ALBICANS  Final   Report Status 06/24/2018 FINAL  Final      Studies: Dg Abd 1 View  Result Date: 06/27/2018 CLINICAL DATA:  Vomiting EXAM: ABDOMEN - 1 VIEW COMPARISON:  06/26/2018; 06/22/2018; CT abdomen and pelvis-06/13/2018 FINDINGS: Re-demonstrated mild patulous distension of the colon, grossly unchanged. No definite evidence of enteric obstruction. No supine evidence of pneumoperitoneum. No pneumatosis or portal venous gas. Gastrostomy tube overlies expected location of mid body of the stomach. Rectal tube overlies expected location of the rectum. No definitive abnormal intra-abdominal calcifications Limited visualization of lower thorax suggests lung hyperexpansion with potential trace bilateral effusions, incompletely evaluated. Re-demonstrated age-indeterminate compression deformities of the T12 and L2 vertebral bodies, incompletely evaluated. IMPRESSION: Similar findings most suggestive of ileus. Electronically Signed   By: Sandi Mariscal M.D.   On: 06/27/2018 07:05   Dg Chest Port 1 View  Result Date: 06/28/2018 CLINICAL DATA:  Acute respiratory failure. History of COPD. Patient admitted 06/03/2018 with pneumonia and urinary tract infection. EXAM: PORTABLE CHEST 1 VIEW COMPARISON:  Single-view of the chest 06/27/2018, 06/26/2018 and 06/23/2018. FINDINGS: Marked emphysema is again seen. Endotracheal tube and left PICC remain in place in good position. Patchy bilateral airspace disease is worse on the left and demonstrates continued improvement. No pneumothorax or pleural fluid. Heart size is normal. IMPRESSION: Continued improvement in bilateral pneumonia. Emphysema. Support apparatus projects in good position. Electronically Signed   By: Inge Rise M.D.   On: 06/28/2018 08:49   Dg Chest Port 1 View  Result Date: 06/27/2018 CLINICAL DATA:  Acute respiratory failure EXAM: PORTABLE CHEST 1 VIEW COMPARISON:  06/26/2018; 06/23/2018; 06/22/2018 FINDINGS: Grossly unchanged cardiac silhouette and mediastinal contours with atherosclerotic plaque within  the thoracic aorta. Stable position of support apparatus. The lungs remain hyperexpanded with flattening the bilaterally diaphragms. Ill-defined heterogeneous interstitial and potential developing airspace opacities within the left mid and lower lung as well as the right mid lung are unchanged. No definite evidence of edema. There is blunting of the bilateral costophrenic angles suggestive of trace bilateral effusions. No pneumothorax. No definite acute osseus abnormalities. IMPRESSION: 1.  Stable positioning of support apparatus.  No pneumothorax. 2. Grossly unchanged bilateral airspace opacities, left greater than right, with broad differential considerations including aspiration and/or atypical infection. 3. Unchanged trace bilateral effusions. Electronically Signed   By: Sandi Mariscal M.D.   On: 06/27/2018 07:07    Scheduled Meds: . chlorhexidine gluconate (MEDLINE KIT)  15 mL Mouth Rinse BID  . famotidine  20 mg Per Tube BID  . free water  30 mL Per Tube Q4H  . furosemide  40 mg Per Tube BID  . ipratropium  0.5 mg Nebulization Q6H  . levETIRAcetam  500 mg Per Tube BID  . mouth rinse  15 mL Mouth Rinse 10 times per day  . metoprolol tartrate  12.5 mg Per Tube BID  . multivitamin  15 mL Per Tube Daily  . [START ON 06/29/2018] predniSONE  40 mg Per Tube Q breakfast  . sodium chloride flush  10-40 mL Intracatheter Q12H  . vitamin C  250 mg Per Tube BID   Continuous Infusions: . sodium chloride 250 mL (06/26/18 0930)  . dexmedetomidine (PRECEDEX) IV infusion 0.2 mcg/kg/hr (06/28/18 1000)  . feeding supplement (VITAL 1.5 CAL) 1,000 mL (06/26/18 1221)  . fentaNYL infusion INTRAVENOUS Stopped (06/26/18 0828)  . meropenem (MERREM) IV 200 mL/hr at 06/28/18 1000  . potassium chloride      Assessment/Plan:   1. Acute respiratory failure post surgery extubated, reintubated again this morning.  Status post gastric laceration status post gastrorrhaphy and gastrostomy.  Tolerating tube feeds 2.   Proteus sepsis from indwelling Foley catheter.  Blood cultures and urine cultures positive.  Foley catheter changed.  Clostridium, Proteus in the blood: Continue meropenem..  Family decided against colonoscopy at this point.  failed swallow evaluation.  Status post surgery for gastric tube, tolerating the tube feeding. 3. chronic aspiration pneumonia ;contineu abx 4. Atrial fibrillation with rapid ventricular response.  Amiodarone drip  now.   5. History of seizures: On Keppra 6. Failure to thrive, cachexia and malnutrition.  Overall prognosis is poor.  Patient is a DNR.  Now his condition is even worsened post procedure, patient's son is coming from [place] tomorrow, that time we will discuss about possible comfort care options.  At present continue full treatment. 7. BPH.   #8. , severe malnutrition in the context of chronic illness, continue tube feeding. #9.hypokalemia: Replace the potassium. Overall prognosis really poor, will talk to family again today. Code Status: Partial code    Code Status Orders  (From admission, onward)         Start     Ordered   06/10/18 1120  Do not attempt resuscitation (DNR)  Continuous    Question Answer Comment  In the event of cardiac or respiratory ARREST Do not call a "code blue"   In the event of cardiac or respiratory ARREST Do not perform Intubation, CPR, defibrillation or ACLS   In the event of cardiac or respiratory ARREST Use medication by any route, position, wound care, and other measures to relive pain and suffering. May use oxygen, suction and manual treatment of airway obstruction as needed for comfort.      06/10/18 1121        Code Status History    Date Active Date Inactive Code Status Order ID Comments User Context   06/09/2018 0209 06/10/2018 1121 DNR [id number]  Arta Silence, MD ED   05/15/2018 1120 05/18/2018 2303 Full Code [id number]  Dustin Flock, MD Inpatient   05/15/2018 0125 05/15/2018 1120 Full Code [id number]  Amelia Jo,  MD Inpatient    Advance Directive Documentation     Most Recent Value  Type of Advance Directive  Healthcare Power of Attorney  Pre-existing out of facility DNR order (yellow form or pink MOST form)  -  "MOST" Form in Place?  -  Disposition Plan:   Antibiotics:  Unasyn  Time spent: 25 minutes.    Epifanio Lesches  [employer]

## 2018-06-28 NOTE — Progress Notes (Addendum)
Daily Progress Note   Patient Name: Alexander Lara       Date: 06/28/2018 DOB: 08/08/1946  Age: 72 y.o. MRN#: [id number] Attending Physician: Epifanio Lesches, MD Primary Care Physician: [facility] Admit Date: 05/25/2018  Reason for Consultation/Follow-up: Psychosocial/spiritual support  Subjective: Patient is resting in bed with ventilator in place. No family at bedside. Have reached out to him previously unsuccessfully. Called [name] x2 yesterday and each time voicemail engaged after 1 ring. Phone number for palliative left with no response.  This does not create a place for palliative to assist at this time. Will sign off. Please call if he arrives to the bedside to facilitate [place] conversation.   Length of Stay: 19  Current Medications: Scheduled Meds:  . chlorhexidine gluconate (MEDLINE KIT)  15 mL Mouth Rinse BID  . famotidine  20 mg Per Tube BID  . free water  30 mL Per Tube Q4H  . furosemide  40 mg Per Tube BID  . ipratropium  0.5 mg Nebulization Q6H  . levETIRAcetam  500 mg Per Tube BID  . mouth rinse  15 mL Mouth Rinse 10 times per day  . metoprolol tartrate  12.5 mg Per Tube BID  . multivitamin  15 mL Per Tube Daily  . [START ON 06/29/2018] predniSONE  40 mg Per Tube Q breakfast  . sodium chloride flush  10-40 mL Intracatheter Q12H  . vitamin C  250 mg Per Tube BID    Continuous Infusions: . sodium chloride 250 mL (06/26/18 0930)  . dexmedetomidine (PRECEDEX) IV infusion 0.2 mcg/kg/hr (06/28/18 1000)  . feeding supplement (VITAL 1.5 CAL) 1,000 mL (06/26/18 1221)  . fentaNYL infusion INTRAVENOUS Stopped (06/26/18 0828)  . meropenem (MERREM) IV 200 mL/hr at 06/28/18 1000  . phenylephrine (NEO-SYNEPHRINE) Adult infusion Stopped (06/27/18 1712)  . potassium  chloride      PRN Meds: sodium chloride, acetaminophen, albuterol, fentaNYL (SUBLIMAZE) injection, metoprolol tartrate, [DISCONTINUED] ondansetron **OR** ondansetron (ZOFRAN) IV, polyvinyl alcohol, sodium chloride, sodium chloride flush  Physical Exam  Constitutional: No distress.  Pulmonary/Chest:  Ventilator            Vital Signs: BP (!) 143/87   Pulse (!) 45   Temp 98.6 F (37 C)   Resp 15   Ht 5' 11" (1.803 m)   Wt 40.7 kg  SpO2 91%   BMI 12.51 kg/m  SpO2: SpO2: 91 % O2 Device: O2 Device: Ventilator O2 Flow Rate: O2 Flow Rate (L/min): 60 L/min  Intake/output summary:   Intake/Output Summary (Last 24 hours) at 06/28/2018 1043 Last data filed at 06/28/2018 1000 Gross per 24 hour  Intake 1049.81 ml  Output 2150 ml  Net -1100.19 ml   LBM: Last BM Date: 06/27/18 Baseline Weight: Weight: 37.5 kg Most recent weight: Weight: 40.7 kg       Palliative Assessment/Data:    Flowsheet Rows     Most Recent Value  Intake Tab  Referral Department  Hospitalist  Unit at Time of Referral  Cardiac/Telemetry Unit  Palliative Care Primary Diagnosis  Sepsis/Infectious Disease  Date Notified  06/09/18  Palliative Care Type  New Palliative care  Reason for referral  Clarify Goals of Care, Counsel Regarding Hospice  Date of Admission  05/31/2018  Date first seen by Palliative Care  06/09/18  # of days Palliative referral response time  0 Day(s)  # of days IP prior to Palliative referral  1  Clinical Assessment  Psychosocial & Spiritual Assessment  Palliative Care Outcomes      Patient Active Problem List   Diagnosis Date Noted  . Pressure injury of skin 06/23/2018  . Recurrent UTI (urinary tract infection) 06/09/2018  . Adult failure to thrive   . Palliative care by specialist   . Severe protein-calorie malnutrition ([place]) 05/17/2018  . Sepsis ([place]) 05/14/2018    Palliative Care Assessment & Plan    Recommendations/Plan:  Continue care. [name] has 2 brothers.  Recommend attempting to speak with the brothers to offer decision making capacity if they would like. He has not provided phone numbers for the brothers as of this time. Have attempted to call him and have left messages with no response. Will sign off at this time. Please call if he arrives to the bedside to facilitate [place] conversation.     Code Status:    Code Status Orders  (From admission, onward)         Start     Ordered   06/24/18 0440  Limited resuscitation (code)  Continuous    Question Answer Comment  In the event of cardiac or respiratory ARREST: Initiate Code Blue, Call Rapid Response No   In the event of cardiac or respiratory ARREST: Perform CPR No   In the event of cardiac or respiratory ARREST: Perform Intubation/Mechanical Ventilation Yes   In the event of cardiac or respiratory ARREST: Use NIPPV/BiPAp only if indicated Yes   In the event of cardiac or respiratory ARREST: Administer ACLS medications if indicated No   In the event of cardiac or respiratory ARREST: Perform Defibrillation or Cardioversion if indicated No      06/24/18 0439        Code Status History    Date Active Date Inactive Code Status Order ID Comments User Context   06/10/2018 1121 06/24/2018 0439 DNR [id number]  Asencion Gowda, NP Inpatient   06/09/2018 0209 06/10/2018 1121 DNR [id number]  Arta Silence, MD ED   05/15/2018 1120 05/18/2018 2303 Full Code [id number]  Dustin Flock, MD Inpatient   05/15/2018 0125 05/15/2018 1120 Full Code [id number]  Amelia Jo, MD Inpatient    Advance Directive Documentation     Most Recent Value  Type of Advance Directive  Healthcare Power of Attorney  Pre-existing out of facility DNR order (yellow form or pink MOST form)  -  "MOST" Form in Place?  -  Prognosis:   Unable to determine  Discharge Planning:  To Be Determined  Care plan was discussed with RN and CM.  Thank you for allowing the Palliative Medicine Team to assist in the care of this  patient.   Total Time 15 min Prolonged Time Billed no      Greater than 50%  of this time was spent counseling and coordinating care related to the above assessment and plan.  Asencion Gowda, NP  Please contact Palliative Medicine Team phone at [phone] for questions and concerns.

## 2018-06-28 NOTE — Consult Note (Addendum)
Pharmacy Electrolyte Monitoring Consult:  Pharmacy consulted to assist in monitoring and replacing electrolytes in this 72 y.o. male admitted on Jul 06, 2018 with Urinary Retention  Patient admitted to ICU s/p PEG placement.   Pt with AFib  Labs:  Sodium (mmol/L)  Date Value  06/28/2018 137   Potassium (mmol/L)  Date Value  06/28/2018 3.0 (L)   Magnesium (mg/dL)  Date Value  16/06/9603 2.4   Phosphorus (mg/dL)  Date Value  54/05/8118 3.0   Calcium (mg/dL)  Date Value  14/78/2956 7.8 (L)   Albumin (g/dL)  Date Value  21/30/8657 1.6 (L)   Corrected Calcium: 9.72  Assessment/Plan: Goal of potassium ~ 4, goal magnesium ~ 2,  and goal phos > 2.5. Patient is at risk for refeeding.  Patient received furosemide 40 mg IV x 1, and is receiving furosemide 40 mg per tube BID - did not receive AM dose due to IV x 1 dose.  Potassium 10 mEq IV x 4 was ordered. Received KCl 30 mEq per tube q4h x 2 doses.  Plan to transition to oral replacement.  No need for magnesium replacement.  Will repeat labs in AM and supplement accordingly.   Pharmacy will continue to monitor and adjust per consult.    Mauri Reading, PharmD Pharmacy Resident  06/28/2018 3:12 PM

## 2018-06-28 NOTE — Consult Note (Signed)
Pharmacy Antibiotic Note  Alexander Lara is a 72 y.o. male admitted on 06-16-18 with pneumonia.  Pharmacy has been consulted for Meropenem dosing. Admitted with on 9/25 for UTI and PNA . Patient underwent EGD on 10/8. Patient had hypoxia after procedure; O2 sats in the 60s and was transferred to ICU.[name] Abdominal x-ray shows large pneumoperitoneum - exploratory laparotomy 10/9. Potential upper GI series tomorrow for gastric repair.  Plan: Continue meropenem 1 g IV Q12h for aspiration PNA x 2 weeks total per ICU rounds discussion. Patient is on day 7 of broad aspiration coverage (Zosyn was switched to Meropenem due to PCN allergy).   Height: 5\' 11"  (180.3 cm) Weight: 89 lb 11.6 oz (40.7 kg) IBW/kg (Calculated) : 75.3  Temp (24hrs), Avg:98.4 F (36.9 C), Min:97.9 F (36.6 C), Max:99.1 F (37.3 C)  Recent Labs  Lab 07/14/2018 2049 06/22/18 0122  06/24/18 0339 06/25/18 0434 06/26/18 0448 06/27/18 0519 06/28/18 0436  WBC  --   --    < > 8.7 9.1 9.6 7.5 11.8*  CREATININE  --   --    < > 0.56* 0.54* 0.62 0.70 0.78  LATICACIDVEN 1.9 1.0  --   --   --   --   --   --    < > = values in this interval not displayed.    Estimated Creatinine Clearance: 47.3 mL/min (by C-G formula based on SCr of 0.78 mg/dL).    Allergies  Allergen Reactions  . Penicillins     Lip Swelling  . Zosyn [Piperacillin Sod-Tazobactam So]     Antimicrobials this admission: 10/11 Merrem >> 10/9 Zosyn >> 10/11 10/7 Augmentin >> 10/8 9/30 Unasyn >>10/8 9/28 Flagyl >> 09/30 9/26 Ceftriaxone >> 9/30   Microbiology results: 10/15 BCx pending 9/25 BCx: Proteus - pansensitive  9/25 UCx: Proteus mirabilis resistant to Macrobid  10/8 Sputum: rare candida albicans  10/8 MRSA PCR: (-)  Thank you for allowing pharmacy to be a part of this patient's care.   Mauri Reading, PharmD Pharmacy Resident  06/28/2018 3:14 PM

## 2018-06-28 NOTE — Care Management (Signed)
MD would like to extubate patient however son [name] cannot be reached to determine whether or not he would want patient re-intubated.  RNCM has sent text message to [name] requesting that he call [facility] at [phone].

## 2018-06-29 ENCOUNTER — Inpatient Hospital Stay: Payer: Medicare Other

## 2018-06-29 DIAGNOSIS — J9601 Acute respiratory failure with hypoxia: Secondary | ICD-10-CM

## 2018-06-29 LAB — CBC WITH DIFFERENTIAL/PLATELET
Abs Immature Granulocytes: 0.04 10*3/uL (ref 0.00–0.07)
Basophils Absolute: 0 10*3/uL (ref 0.0–0.1)
Basophils Relative: 0 %
EOS ABS: 0.2 10*3/uL (ref 0.0–0.5)
EOS PCT: 2 %
HEMATOCRIT: 27.5 % — AB (ref 39.0–52.0)
HEMOGLOBIN: 8.9 g/dL — AB (ref 13.0–17.0)
IMMATURE GRANULOCYTES: 0 %
LYMPHS ABS: 0.5 10*3/uL — AB (ref 0.7–4.0)
LYMPHS PCT: 5 %
MCH: 27.1 pg (ref 26.0–34.0)
MCHC: 32.4 g/dL (ref 30.0–36.0)
MCV: 83.6 fL (ref 80.0–100.0)
MONOS PCT: 2 %
Monocytes Absolute: 0.2 10*3/uL (ref 0.1–1.0)
NEUTROS PCT: 91 %
Neutro Abs: 8.3 10*3/uL — ABNORMAL HIGH (ref 1.7–7.7)
Platelets: 124 10*3/uL — ABNORMAL LOW (ref 150–400)
RBC: 3.29 MIL/uL — ABNORMAL LOW (ref 4.22–5.81)
RDW: 20.1 % — AB (ref 11.5–15.5)
WBC: 9.2 10*3/uL (ref 4.0–10.5)
nRBC: 0 % (ref 0.0–0.2)

## 2018-06-29 LAB — COMPREHENSIVE METABOLIC PANEL
ALBUMIN: 1.7 g/dL — AB (ref 3.5–5.0)
ALT: 52 U/L — ABNORMAL HIGH (ref 0–44)
AST: 22 U/L (ref 15–41)
Alkaline Phosphatase: 67 U/L (ref 38–126)
Anion gap: 7 (ref 5–15)
BILIRUBIN TOTAL: 0.4 mg/dL (ref 0.3–1.2)
BUN: 25 mg/dL — AB (ref 8–23)
CALCIUM: 7.7 mg/dL — AB (ref 8.9–10.3)
CO2: 31 mmol/L (ref 22–32)
Chloride: 100 mmol/L (ref 98–111)
Creatinine, Ser: 0.75 mg/dL (ref 0.61–1.24)
GFR calc Af Amer: >60 mL/min (ref >60)
GLUCOSE: 119 mg/dL — AB (ref 70–99)
POTASSIUM: 3.6 mmol/L (ref 3.5–5.1)
Sodium: 138 mmol/L (ref 135–145)
TOTAL PROTEIN: 4.9 g/dL — AB (ref 6.5–8.1)

## 2018-06-29 LAB — BLOOD GAS, ARTERIAL
ACID-BASE EXCESS: 10.4 mmol/L — AB (ref 0.0–2.0)
Acid-Base Excess: 10.3 mmol/L — ABNORMAL HIGH (ref 0.0–2.0)
BICARBONATE: 32.8 mmol/L — AB (ref 20.0–28.0)
Bicarbonate: 32.6 mmol/L — ABNORMAL HIGH (ref 20.0–28.0)
FIO2: 0.35
FIO2: 0.35
MODE: POSITIVE
O2 SAT: 98.5 %
O2 Saturation: 98.5 %
PATIENT TEMPERATURE: 37
PCO2 ART: 35 mmHg (ref 32.0–48.0)
PEEP/CPAP: 5 cmH2O
PH ART: 7.58 — AB (ref 7.350–7.450)
PO2 ART: 96 mmHg (ref 83.0–108.0)
PRESSURE SUPPORT: 5 cmH2O
Patient temperature: 37
pCO2 arterial: 34 mmHg (ref 32.0–48.0)
pH, Arterial: 7.59 — ABNORMAL HIGH (ref 7.350–7.450)
pO2, Arterial: 98 mmHg (ref 83.0–108.0)

## 2018-06-29 LAB — BRAIN NATRIURETIC PEPTIDE: B NATRIURETIC PEPTIDE 5: 228 pg/mL — AB (ref 0.0–100.0)

## 2018-06-29 LAB — MAGNESIUM
MAGNESIUM: 2.1 mg/dL (ref 1.7–2.4)
MAGNESIUM: 2.1 mg/dL (ref 1.7–2.4)

## 2018-06-29 LAB — TROPONIN I: Troponin I: <0.03 ng/mL (ref <0.03)

## 2018-06-29 LAB — PHOSPHORUS: Phosphorus: 2.1 mg/dL — ABNORMAL LOW (ref 2.5–4.6)

## 2018-06-29 LAB — TSH: TSH: 2.752 u[IU]/mL (ref 0.350–4.500)

## 2018-06-29 LAB — POTASSIUM: Potassium: 3.6 mmol/L (ref 3.5–5.1)

## 2018-06-29 MED ORDER — ACETAZOLAMIDE SODIUM 500 MG IJ SOLR
250.0000 mg | Freq: Once | INTRAMUSCULAR | Status: AC
  Administered 2018-06-29: 250 mg via INTRAVENOUS
  Filled 2018-06-29 (×2): qty 250

## 2018-06-29 MED ORDER — AMIODARONE HCL IN DEXTROSE 360-4.14 MG/200ML-% IV SOLN
60.0000 mg/h | INTRAVENOUS | Status: AC
  Administered 2018-06-29: 60 mg/h via INTRAVENOUS
  Filled 2018-06-29 (×2): qty 200

## 2018-06-29 MED ORDER — VITAL 1.5 CAL PO LIQD
1000.0000 mL | ORAL | Status: DC
  Administered 2018-06-29: 1000 mL

## 2018-06-29 MED ORDER — POTASSIUM CHLORIDE 20 MEQ PO PACK
40.0000 meq | PACK | ORAL | Status: AC
  Administered 2018-06-29 (×2): 40 meq
  Filled 2018-06-29 (×2): qty 2

## 2018-06-29 MED ORDER — AMIODARONE IV BOLUS ONLY 150 MG/100ML
150.0000 mg | Freq: Once | INTRAVENOUS | Status: AC
  Administered 2018-06-29: 150 mg via INTRAVENOUS
  Filled 2018-06-29: qty 100

## 2018-06-29 MED ORDER — HEPARIN SODIUM (PORCINE) 5000 UNIT/ML IJ SOLN
5000.0000 [IU] | Freq: Three times a day (TID) | INTRAMUSCULAR | Status: DC
  Administered 2018-06-29 – 2018-07-06 (×22): 5000 [IU] via SUBCUTANEOUS
  Filled 2018-06-29 (×22): qty 1

## 2018-06-29 MED ORDER — AMIODARONE HCL IN DEXTROSE 360-4.14 MG/200ML-% IV SOLN
30.0000 mg/h | INTRAVENOUS | Status: DC
  Administered 2018-06-30: 30 mg/h via INTRAVENOUS
  Administered 2018-06-30 (×2): 60 mg/h via INTRAVENOUS
  Administered 2018-07-01 – 2018-07-04 (×6): 30 mg/h via INTRAVENOUS
  Filled 2018-06-29 (×8): qty 200

## 2018-06-29 MED ORDER — DILTIAZEM HCL 25 MG/5ML IV SOLN
10.0000 mg | Freq: Once | INTRAVENOUS | Status: AC
  Administered 2018-06-29: 10 mg via INTRAVENOUS
  Filled 2018-06-29: qty 5

## 2018-06-29 MED ORDER — STERILE WATER FOR INJECTION IJ SOLN
INTRAMUSCULAR | Status: AC
  Administered 2018-06-29: 10 mL
  Filled 2018-06-29: qty 10

## 2018-06-29 NOTE — Plan of Care (Signed)
  Problem: Clinical Measurements: Goal: Ability to maintain clinical measurements within normal limits will improve Outcome: Progressing Goal: Will remain free from infection Outcome: Progressing Goal: Diagnostic test results will improve Outcome: Progressing   Problem: Safety: Goal: Ability to remain free from injury will improve Outcome: Progressing   Problem: Urinary Elimination: Goal: Signs and symptoms of infection will decrease Outcome: Progressing

## 2018-06-29 NOTE — Care Management Note (Signed)
Case Management Note  Patient Details  Name: Alexander Lara MRN: [id number] Date of Birth: 1946-07-15  Subjective/Objective:       Attempts have been made to contact the son Nabil Bubolz on several occasions and he has not been available or not answered the phone.  I was able to call and speak with him this morning.  I asked him about his thoughts on his father's plan of care, he reports that he really wants to be here to see his father before making any final decisions.  As for right now the son states that until he can get here to continue care.  He reports that he is trying to get here but facing financial difficulties as well as medical and legal troubles.  Instructed the son to please keep [place] updated he has my number, and the ICU number.   Robbie Lis BSN [facility]  [phone]             Action/Plan:   Expected Discharge Date:                  Expected Discharge Plan:  Rest Home  In-House Referral:     Discharge planning Services     Post Acute Care Choice:    Choice offered to:     DME Arranged:    DME Agency:     HH Arranged:    HH Agency:     Status of Service:  In process, will continue to follow  If discussed at Long Length of Stay Meetings, dates discussed:    Additional Comments:  Allayne Butcher, RN 06/29/2018, 9:28 AM

## 2018-06-29 NOTE — Consult Note (Signed)
Pharmacy Antibiotic Note  Alexander Lara is a 72 y.o. male admitted on 05/21/2018 with pneumonia.  Pharmacy has been consulted for Meropenem dosing. Admitted with on 9/25 for UTI and PNA . Patient underwent EGD on 10/8. Patient had hypoxia after procedure; O2 sats in the 60s and was transferred to ICU. Patient extubated 10/16.   Plan: Continue meropenem 1 g IV Q12h for aspiration PNA x 2 weeks total per ICU rounds discussion. Patient is on day 8 of broad aspiration coverage (Zosyn was switched to Meropenem due to PCN allergy).   Height: 5\' 11"  (180.3 cm) Weight: 89 lb 11.6 oz (40.7 kg) IBW/kg (Calculated) : 75.3  Temp (24hrs), Avg:99.4 F (37.4 C), Min:99 F (37.2 C), Max:99.7 F (37.6 C)  Recent Labs  Lab 06/25/18 0434 06/26/18 0448 06/27/18 0519 06/28/18 0436 06/29/18 0435  WBC 9.1 9.6 7.5 11.8* 9.2  CREATININE 0.54* 0.62 0.70 0.78 0.75    Estimated Creatinine Clearance: 47.3 mL/min (by C-G formula based on SCr of 0.75 mg/dL).    Allergies  Allergen Reactions  . Penicillins     Lip Swelling  . Zosyn [Piperacillin Sod-Tazobactam So]     Antimicrobials this admission: 10/11 Merrem >> 10/20 10/9 Zosyn >> 10/11 10/7 Augmentin >> 10/8 9/30 Unasyn >>10/8 9/28 Flagyl >> 09/30 9/26 Ceftriaxone >> 9/30   Microbiology results: 10/15 BCx: no growth < 24 hours  9/25 BCx: Proteus - pansensitive  9/25 UCx: Proteus mirabilis resistant to Macrobid  10/8 Sputum: rare candida albicans  10/8 MRSA PCR: (-)  Thank you for allowing pharmacy to be a part of this patient's care.  [name] 06/29/2018 4:36 PM

## 2018-06-29 NOTE — Progress Notes (Addendum)
PULMONARY / CRITICAL CARE MEDICINE   Name: Alexander Lara MRN: [id number] DOB: [birth year]    ADMISSION DATE:  06/19/2018  REFERRING MD:  Dr. Maximino Greenland  CHIEF COMPLAINT:  Acute Hypoxic Respiratory Failure  SHORT DISCUSSION: 72 y.o. Male admitted with Proteus UTI , Bacteremia, and sepsis from chronic indwelling foley.  Pt has a history of Chronic subdural hematoma, chronic aspiration, and severe malnutrition.  On 10/8 he underwent EGD for PEG placement, of which attempt was aborted due to blade entering the stomach.  Post procedure pt developed severe Acute Hypoxic Respiratory Failure necessitating intubation.  Follow up CXR with LLL infiltrate concerning for aspiration.  Follow up abdominal x-ray with large acute pneumoperitoneum concerning for possible abdominal perforation. Therefore, surgery consulted pt underwent gastrorrhaphy and gastrostomy he remained intubated postop.    CULTURES: Blood 9/25>> Proteus Mirabilis (pan sensitive), Clostridium species Urine 9/25>> Proteus Mirabilis (resistant to Nitrofurantoin) Sputum 10/8>> Rare candida albicans   ANTIBIOTICS: Rocephin 9/25>> 9/30 Flagyl 9/28>> 9/30 Augmentin 10/7>>10/7 Unasyn 9/30>>10/8 Zosyn 10/8>>10/11 Vancomycin 10/8>>10/8 Meropenum 10/11>>  SIGNIFICANT EVENTS: 9/25>> Admission to [facility] 10/8>> EGD with attempt at PEG tube, aborted due to blade entering the stomach  10/8>> hypoxic post procedure requiring intubation 10/8>> large neumoperitoneum on CXR with questionable abdominal perforation 10/9>> underwent gastrorrhaphy and gastrostomy due to gastric laceration during PEG tube placement attempt  10/10>> Pt successfully intubated  10/12>> Pt transferred out of ICU to telemetry unit  10/13>> Pt transferred back to ICU with respiratory failure secondary to aspiration requiring mechanical intubation 1014: Stool occult positive Lovenox was held  LINES/TUBES: ETT 07/02/2018>>10/10 Left Brachial PICC 06/13/18>> ETT 10/13>> REVIEW  OF SYSTEMS:   Unable to assess pt intubated   SUBJECTIVE:  Patient is currently intubated but able to open eyes follow simple commands - Currently placed on pressure support with PEEP-overall is doing well, patient did very well yesterday also ABG was acceptable - Currently on Precedex-plan to taper it off -Was treated for Proteus sensitive sepsis - Potassium 3.6 and replaced -Patient is 11 L positive- -800 yesterday with Lasix - No echo identified in the system-pending report   VITAL SIGNS: BP 115/79   Pulse 81   Temp 99.5 F (37.5 C)   Resp 20   Ht 5\' 11"  (1.803 m)   Wt 40.7 kg   SpO2 97%   BMI 12.51 kg/m   HEMODYNAMICS:    VENTILATOR SETTINGS: Vent Mode: PSV FiO2 (%):  [35 %-40 %] 35 % Set Rate:  [20 bmp] 20 bmp Vt Set:  [450 mL] 450 mL PEEP:  [5 cmH20] 5 cmH20 Pressure Support:  [7 cmH20-8 cmH20] 8 cmH20 Plateau Pressure:  [14 cmH20-15 cmH20] 14 cmH20  INTAKE / OUTPUT: I/O last 3 completed shifts: In: 1442.7 [I.V.:69.5; Other:90; NG/GT:897.5; IV Piggyback:385.6] Out: 3400 [Urine:3400]  PHYSICAL EXAMINATION: General: acutely ill appearing male, NAD mechanically intubated  Neuro: sedated not following commands, PERRL  HEENT: supple, JVD present  Cardiovascular: irregular irregular, no R/G Lungs: diffuse crackles and rhonchi throughout, even, non labored  Abdomen: +BS x4, soft, non distended, LUQ G tube present dressing dry and intact  Musculoskeletal: severely cachetic  Skin: midline abdominal incision with honeycomb dressing intact  LABS:  BMET Recent Labs  Lab 06/27/18 0519 06/28/18 0436 06/29/18 0435  NA 135 137 138  K 3.8 3.0* 3.6  CL 99 98 100  CO2 25 29 31   BUN 18 22 25*  CREATININE 0.70 0.78 0.75  GLUCOSE 145* 127* 119*    Electrolytes Recent Labs  Lab 06/26/18  4098 06/27/18 0519 06/28/18 0436 06/29/18 0435  CALCIUM 7.6* 7.4* 7.8* 7.7*  MG 1.9 1.6* 2.4 2.1  PHOS 3.6 3.0  --  2.1*    CBC Recent Labs  Lab 06/27/18 0519  06/28/18 0436 06/29/18 0435  WBC 7.5 11.8* 9.2  HGB 9.7* 10.4* 8.9*  HCT 29.2* 31.3* 27.5*  PLT 137* 145* 124*    Coag's No results for input(s): APTT, INR in the last 168 hours.  Sepsis Markers Recent Labs  Lab 06/23/18 0357  PROCALCITON 0.97    ABG Recent Labs  Lab 06/26/18 0915 06/28/18 1001 06/29/18 0500  PHART 7.31* 7.53* 7.58*  PCO2ART 42 33 35  PO2ART 85 70* 98    Liver Enzymes Recent Labs  Lab 06/23/18 1202 06/29/18 0435  AST  --  22  ALT  --  52*  ALKPHOS  --  67  BILITOT  --  0.4  ALBUMIN 1.6* 1.7*    Cardiac Enzymes No results for input(s): TROPONINI, PROBNP in the last 168 hours.  Glucose Recent Labs  Lab 06/26/18 1145 06/26/18 1549 06/26/18 1936 06/26/18 2321 06/27/18 0353 06/27/18 0721  GLUCAP 118* 122* 104* 117* 80 127*    Imaging Dg Chest Port 1 View  Result Date: 06/29/2018 CLINICAL DATA:  Shortness of breath. EXAM: PORTABLE CHEST 1 VIEW COMPARISON:  06/28/2018. FINDINGS: Unchanged support tubes and lines. COPD with hyperinflation. BILATERAL pulmonary opacities show minimal improvement, particularly LEFT upper lobe. IMPRESSION: Slight improvement aeration. Electronically Signed   By: Elsie Stain M.D.   On: 06/29/2018 07:41   STUDIES: . CT  Chest / Abdomen / Pelvis 06/13/18>> Patchy densities in the right lower lobe and inferior right upper lobe, compatible with incompletely resolved pneumonia. Follow-up chest radiographs are recommended until this completely clears. Mild-to-moderate changes of COPD. Small pericardial effusion. Multiple dependent calculi in the urinary bladder. 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  CT Head wo Contrast 06/16/18>> No acute intracranial abnormalities. 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 KUB 07/01/2018>> Large pneumoperitoneum. Metal clips overlying the stomach presumably related to gastrostomy  tube placement.    ASSESSMENT / PLAN:- I personally tried to call patient's son at least 4 times, palliative team also try to call his son multiple times without success, at this point patient is doing well on SBT however we wanted to have goals of care identified before we can consider extubation, as we are not able to reach patient's son we will consider SBT and if he is doing well extubation and once patient is extubated try to get his opinion on goals of care, the patient does decline and there is no clear understanding of goals of care we will follow what goals of care is identified before  Acute hypoxic respiratory failure secondary to pneumonia and pulmonary edema  Edema of lower lip possible allergic reaction-resolved  Mechanical ventilation  Hx: Chronic aspiration and Emphysema  -Placed on SBT pressure support of 5 with PEEP of 5 we will repeat blood gas in 1 over and decide Scheduled and prn bronchodilator therapy  -Currently on p.o. prednisone-taper as tolerated -Errantly on p.o. Lasix doing well - Pending report echocardiogram -VAP bundle implemented -As outlined above unsure about goals of care on the patient, if he is doing well from SBT point of view we will consider extubation as we are not able to establish goals of care with the son even after multiple attempts of phone calls by myself  other team of the members and palliative team, once patient is extubated hopefully he will participate for goals of care  Atrial fibrillation with rvr  LUE non occlusive subclavian DVT associated with PICC-finding on [place] 06/24/18  Continuous telemetry monitoring  Doing well currently on metoprolol Subq lovenox- was held due to stool occult positive however hemoglobin stable will consider resuming today and monitor closely  Hypomagnesia Trend BMP- magnesium is okay patient is showing signs of metabolic alkalosis due to Lasix we will give a dose of Diamox Replace electrolytes as  indicated-potassium replaced Monitor UOP  Anemia without acute blood loss Trend CBC  Monitor for s/sx of bleeding and transfuse for hgb <7  Gastric laceration s/p gastrorrhaphy and gastrostomy 06/22/18 Vomiting  Severe malnutrition related to chronic illness  Continue TF's for now  Continue bowel regimen  SUP px: pepcid -Feeding on hold for weaning  Severe sepsis/proteus UTI/bacteremia  Aspiration pneumonia with bibasilar infiltrates (left greater than right) Trend WBC and monitor fever curve  Trend PCT Continue meropenum- last on from 06/25/2018 - Repeat blood cultures (negative so far) if negative consider stopping the antibiotics after total of 2 weeks -If blood cultures continue to remain positive consider ID consultation  Mechanical intubation discomfort/pain  -On sedation vacation doing well -Currently on Precedex doing well   -Palliative note appreciated seems like not able to reach the patient's son  TSH is ok  Skin/Wound: Chronic changes  Electrolytes: Replace electrolytes per ICU electrolyte replacement protocol.   IVF: none  Nutrition: Tube feeds as tolerated  Prophylaxis: DVT Prophylaxis with Lovenox resumed today,. GI Prophylaxis.   Restraints: Soft limb  PT/OT eval and treat. OOB when appropriate.   Lines/Tubes: 928 Foley 930 PICC line central line.  ADVANCE DIRECTIVE: Partial code  FAMILY DISCUSSION: Not able to reach son, if patient is extubated hopefully he will be able to participate in goals of care quality Care: PPI, DVT prophylaxis, HOB elevated, Infection control all reviewed and addressed.  Events and notes from last 24 hours reviewed. Care plan discussed on multidisciplinary rounds  CC TIME 35 minutes   Old records reviewed discussed results and management plan with patient  Images personally reviewed and results and labs reviewed and discussed with patient.  All medication reviewed and adjusted  Further management depending on test  results and work up as outlined above.    Roseanne Reno, M.D

## 2018-06-29 NOTE — Progress Notes (Signed)
Nutrition Follow-up  DOCUMENTATION CODES:   Severe malnutrition in context of chronic illness  INTERVENTION:  Continue Vital 1.5 at 40 mL/hr (960 mL goal daily volume). Provides 1440 kcal, 65 grams of protein, 730 mL H2O daily.  Continue free water flush of 30 mL Q4hrs to maintain tube patency.  Continue liquid MVI daily per tube.  Continue vitamin C 250 mg BID per tube.  Continue monitoring potassium, magnesium, and phosphorus and replacing as needed.  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  Ongoing - addressing with TF regimen.  GOAL:   Patient will meet greater than or equal to 90% of their needs  Met with TF regimen.  MONITOR:   Labs, Weight trends, TF tolerance, Skin, I & O's  REASON FOR ASSESSMENT:   Malnutrition Screening Tool, Ventilator    ASSESSMENT:   Pt is resident of [place] admitted 9/25 for urinary retention w/ foley catheter in place and failure to thrive. Previous admit 8/31PMH: emphysema, seizures, subdural heamtoma   Placement of PEG tube was attempted 10/8 but was unsuccessful. Due to patient's cachexia the blade was seen in the stomach as soon as the incision was made. Two clips were placed. -Patient had hypoxia following attempt at PEG tube placement and required intubation. -Patient underwent exploratory laparotomy, gastrorrhaphy, and placement of 16 Fr. Gastrostomy tube on 10/9 shortly after midnight. -Patient was extubated on 10/10. -Had 2 episodes of emesis on 10/13. Was later re-intubated. -Patient was extubated today.  Patient has now been extubated. On nasal cannula 3L/min. Abdomen soft. Patient is tolerating tube feeds. Having liquid bowel movements. Prior to intubation patient was tolerating Jevity via NGT. However, when he was switched to [facility] after his extubation on 10/10 he did have 2 episodes of emesis a few days later. Will leave  patient on Vital 1.5 for now to promote ongoing GI tolerance of enteral nutrition.  Enteral Access: 16 Fr. G-tube placed surgically on 10/9  MAP: 65-102 mmHg  TF: pt now back at goal regimen of Vital 1.5 at 40 mL/hr and tolerating  Medications reviewed and include: famotidine, free water flush 30 mL Q4hrs, Lasix 40 mg BID, Keppra, liquid MVI daily, prednisone 40 mg daily, vitamin C 250 mg BID, meropenem.  Labs reviewed: BUN 25, Phosphorus 2.1.  I/O: 2200 mL UOP yesterday (2.3 mL/kg/hr)  Weight trend: 40.7 kg on 10/15; +11.8 kg from weight on 9/26  Discussed with RN and on rounds.  Diet Order:   Diet Order            Diet NPO time specified  Diet effective now              EDUCATION NEEDS:   No education needs have been identified at this time  Skin:  Skin Assessment: Skin Integrity Issues: Skin Integrity Issues:: Stage I, Incisions Stage I: mid sacrum Incisions: closed incision to abdomen  Last BM:  06/28/2018 - large type 7  Height:   Ht Readings from Last 1 Encounters:  06/25/18 '5\' 11"'  (1.803 m)    Weight:   Wt Readings from Last 1 Encounters:  06/28/18 40.7 kg    Ideal Body Weight:  78.2 kg  BMI:  Body mass index is 12.51 kg/m.  Estimated Nutritional Needs:   Kcal:  1500-1800kcal/day   Protein:  54-60 grams  Fluid:  1 L/day  Willey Blade, MS, RD, LDN Office: [phone] Pager: [phone] After Hours/Weekend Pager: [phone]
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
## 2018-06-29 NOTE — Consult Note (Signed)
Pharmacy Electrolyte Monitoring Consult:  Pharmacy consulted to assist in monitoring and replacing electrolytes in this 72 y.o. male admitted on 06/07/2018 with Urinary Retention  Patient admitted to ICU s/p PEG placement.   Pt with AFib  Labs:  Sodium (mmol/L)  Date Value  06/29/2018 138   Potassium (mmol/L)  Date Value  06/29/2018 3.6   Magnesium (mg/dL)  Date Value  16/06/9603 2.1   Phosphorus (mg/dL)  Date Value  54/05/8118 2.1 (L)   Calcium (mg/dL)  Date Value  14/78/2956 7.7 (L)   Albumin (g/dL)  Date Value  21/30/8657 1.7 (L)   Corrected Calcium: 9.72  Assessment/Plan: Patient continued on furosemide 40mg  VT BID. Acetazolamide 250mg  IV x 1 ordered 10/16.   Will order potassium VT Q4hr x 2 doses.   Pharmacy will continue to monitor and adjust per consult.   Simpson,Michael L, 06/29/2018 4:38 PM

## 2018-06-29 NOTE — Progress Notes (Signed)
Placed on High fowlers position, cuff deflated, suctioned orally and endotracheally and then extubated to 4 lpm O2 [place]

## 2018-06-30 ENCOUNTER — Inpatient Hospital Stay: Payer: Medicare Other

## 2018-06-30 DIAGNOSIS — A419 Sepsis, unspecified organism: Secondary | ICD-10-CM

## 2018-06-30 LAB — CBC WITH DIFFERENTIAL/PLATELET
Abs Immature Granulocytes: 0.1 10*3/uL — ABNORMAL HIGH (ref 0.00–0.07)
Basophils Absolute: 0 10*3/uL (ref 0.0–0.1)
Basophils Relative: 0 %
EOS PCT: 0 %
Eosinophils Absolute: 0 10*3/uL (ref 0.0–0.5)
HEMATOCRIT: 36.3 % — AB (ref 39.0–52.0)
HEMOGLOBIN: 11.4 g/dL — AB (ref 13.0–17.0)
Immature Granulocytes: 1 %
LYMPHS ABS: 0.8 10*3/uL (ref 0.7–4.0)
Lymphocytes Relative: 5 %
MCH: 26.5 pg (ref 26.0–34.0)
MCHC: 31.4 g/dL (ref 30.0–36.0)
MCV: 84.2 fL (ref 80.0–100.0)
MONO ABS: 0.4 10*3/uL (ref 0.1–1.0)
MONOS PCT: 3 %
Neutro Abs: 14.2 10*3/uL — ABNORMAL HIGH (ref 1.7–7.7)
Neutrophils Relative %: 91 %
Platelets: 176 10*3/uL (ref 150–400)
RBC: 4.31 MIL/uL (ref 4.22–5.81)
RDW: 19.9 % — AB (ref 11.5–15.5)
WBC: 15.5 10*3/uL — ABNORMAL HIGH (ref 4.0–10.5)
nRBC: 0 % (ref 0.0–0.2)

## 2018-06-30 LAB — COMPREHENSIVE METABOLIC PANEL
ALBUMIN: 2.4 g/dL — AB (ref 3.5–5.0)
ALT: 53 U/L — ABNORMAL HIGH (ref 0–44)
AST: 22 U/L (ref 15–41)
Alkaline Phosphatase: 94 U/L (ref 38–126)
Anion gap: 11 (ref 5–15)
BILIRUBIN TOTAL: 0.5 mg/dL (ref 0.3–1.2)
BUN: 33 mg/dL — ABNORMAL HIGH (ref 8–23)
CO2: 33 mmol/L — AB (ref 22–32)
Calcium: 8.3 mg/dL — ABNORMAL LOW (ref 8.9–10.3)
Chloride: 95 mmol/L — ABNORMAL LOW (ref 98–111)
Creatinine, Ser: 0.75 mg/dL (ref 0.61–1.24)
GFR calc Af Amer: >60 mL/min (ref >60)
GFR calc non Af Amer: >60 mL/min (ref >60)
GLUCOSE: 126 mg/dL — AB (ref 70–99)
POTASSIUM: 3.2 mmol/L — AB (ref 3.5–5.1)
SODIUM: 139 mmol/L (ref 135–145)
TOTAL PROTEIN: 6.6 g/dL (ref 6.5–8.1)

## 2018-06-30 LAB — ECHOCARDIOGRAM COMPLETE
Height: 71 in
WEIGHTICAEL: 1435.64 [oz_av]

## 2018-06-30 LAB — MAGNESIUM: Magnesium: 2 mg/dL (ref 1.7–2.4)

## 2018-06-30 LAB — GLUCOSE, CAPILLARY
GLUCOSE-CAPILLARY: 107 mg/dL — AB (ref 70–99)
Glucose-Capillary: 121 mg/dL — ABNORMAL HIGH (ref 70–99)

## 2018-06-30 MED ORDER — IOPAMIDOL (ISOVUE-300) INJECTION 61%
15.0000 mL | INTRAVENOUS | Status: AC
  Administered 2018-06-30 (×2): 15 mL via ORAL

## 2018-06-30 MED ORDER — POTASSIUM CHLORIDE 20 MEQ PO PACK
40.0000 meq | PACK | Freq: Once | ORAL | Status: AC
  Administered 2018-06-30: 40 meq
  Filled 2018-06-30: qty 2

## 2018-06-30 MED ORDER — ACETAZOLAMIDE SODIUM 500 MG IJ SOLR
500.0000 mg | Freq: Once | INTRAMUSCULAR | Status: AC
  Administered 2018-06-30: 500 mg via INTRAVENOUS
  Filled 2018-06-30: qty 500

## 2018-06-30 MED ORDER — IOHEXOL 300 MG/ML  SOLN
65.0000 mL | Freq: Once | INTRAMUSCULAR | Status: AC | PRN
  Administered 2018-06-30: 65 mL via INTRAVENOUS

## 2018-06-30 MED ORDER — FUROSEMIDE 20 MG PO TABS
20.0000 mg | ORAL_TABLET | Freq: Two times a day (BID) | ORAL | Status: DC
  Administered 2018-06-30 – 2018-07-06 (×13): 20 mg
  Filled 2018-06-30 (×12): qty 1

## 2018-06-30 MED ORDER — LACTATED RINGERS IV SOLN
INTRAVENOUS | Status: DC
  Administered 2018-06-30 – 2018-07-04 (×3): via INTRAVENOUS

## 2018-06-30 MED ORDER — PREDNISONE 20 MG PO TABS
20.0000 mg | ORAL_TABLET | Freq: Every day | ORAL | Status: DC
  Administered 2018-07-01 – 2018-07-06 (×6): 20 mg
  Filled 2018-06-30 (×6): qty 1

## 2018-06-30 MED ORDER — FUROSEMIDE 10 MG/ML IJ SOLN
20.0000 mg | Freq: Once | INTRAMUSCULAR | Status: AC
  Administered 2018-06-30: 20 mg via INTRAVENOUS
  Filled 2018-06-30: qty 2
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
## 2018-06-30 NOTE — Consult Note (Signed)
[facility] Consultation Note  Patient ID: Alexander Lara, MRN: [id number], DOB/AGE: Jul 07, 1946 72 y.o. Admit date: 05/17/2018   Date of Consult: 06/30/2018 Primary Physician: [place] Primary Cardiologist: None  Chief Complaint:  Chief Complaint  Patient presents with  . Urinary Retention   Reason for Consult: Atrial fibrillation  HPI: 72 y.o. male with no apparent recurrent urinary tract infections status post previous history of subdural hematoma and ataxia and contractures who has had a recent PEG tube placement due to inability to swallow well.  The patient additionally has had new onset of pulmonary infection with hypoxia which has caused significant respiratory distress.  In this the patient has had an echocardiogram showing mild global LV systolic dysfunction with ejection fraction of 45% possibly consistent with current condition rather than cardiomyopathy.  He also has recurrent episodes of atrial fibrillation with rapid ventricular rate which has been relatively responsive to metoprolol as well as amiodarone.  At this current rate he is in normal sinus rhythm with good heart rate and no concerns. Past Medical History:  Diagnosis Date  . Anxiety   . Emphysema of lung ([place])   . Foley catheter in place   . Seizures ([place])   . Subdural hematoma ([place])   . Urinary retention       Surgical History:  Past Surgical History:  Procedure Laterality Date  . LAPAROTOMY N/A 07/06/2018   Procedure: EXPLORATORY LAPAROTOMY, gastric repair, gastrostomy tube placement;  Surgeon: Herbert Pun, MD;  Location: [facility] ORS;  Service: General;  Laterality: N/A;  . PEG PLACEMENT N/A 07/12/2018   Procedure: PERCUTANEOUS ENDOSCOPIC GASTROSTOMY (PEG) PLACEMENT;  Surgeon: Virgel Manifold, MD;  Location: [facility] ENDOSCOPY;  Service: Endoscopy;  Laterality: N/A;     Home Meds: Prior to Admission medications   Medication Sig Start Date End Date Taking? Authorizing  Provider  acetaminophen (TYLENOL) 325 MG tablet Take 650 mg by mouth every 6 (six) hours as needed for mild pain.   Yes [provider]  docusate (COLACE) 50 MG/5ML liquid Take 100 mg by mouth daily.   Yes [provider]  ENSURE PLUS (ENSURE PLUS) LIQD Take 237 mLs by mouth 2 (two) times daily between meals.   Yes [provider]  ferrous sulfate 220 (44 Fe) MG/5ML solution Take 330 mg by mouth 2 (two) times daily with a meal.   Yes [provider]  levETIRAcetam (KEPPRA) 100 MG/ML solution Take 500 mg by mouth 2 (two) times daily.   Yes [provider]  magnesium oxide (MAG-OX) 400 (241.3 Mg) MG tablet Take 1 tablet (400 mg total) by mouth daily. 05/19/18  Yes Fritzi Mandes, MD  megestrol (MEGACE) 400 MG/10ML suspension Take 20 mLs (800 mg total) by mouth daily. 05/19/18  Yes Fritzi Mandes, MD  Multiple Vitamin (MULTIVITAMIN) LIQD Take 15 mLs by mouth daily.   Yes [provider]  phosphorus (K PHOS NEUTRAL) 155-852-130 MG tablet Take 2 tablets (500 mg total) by mouth daily. 05/19/18  Yes Fritzi Mandes, MD  tamsulosin (FLOMAX) 0.4 MG CAPS capsule Take 1 capsule (0.4 mg total) by mouth daily. 05/19/18  Yes Fritzi Mandes, MD  vitamin C (VITAMIN C) 250 MG tablet Take 1 tablet (250 mg total) by mouth 2 (two) times daily. 05/18/18  Yes Fritzi Mandes, MD    Inpatient Medications:  . chlorhexidine gluconate (MEDLINE KIT)  15 mL Mouth Rinse BID  . famotidine  20 mg Per Tube BID  . free water  30 mL Per  Tube Q4H  . furosemide  20 mg Per Tube BID  . heparin injection (subcutaneous)  5,000 Units Subcutaneous Q8H  . ipratropium  0.5 mg Nebulization Q6H  . levETIRAcetam  500 mg Per Tube BID  . mouth rinse  15 mL Mouth Rinse 10 times per day  . metoprolol tartrate  12.5 mg Per Tube BID  . multivitamin  15 mL Per Tube Daily  . [START ON 07/01/2018] predniSONE  20 mg Per Tube Q breakfast  . sodium chloride flush  10-40 mL Intracatheter Q12H  . vitamin C  250 mg Per Tube  BID   . sodium chloride 250 mL (06/26/18 0930)  . amiodarone 30 mg/hr (06/30/18 1800)  . feeding supplement (VITAL 1.5 CAL) 40 mL/hr at 06/30/18 1630  . meropenem (MERREM) IV Stopped (06/30/18 1046)    Allergies:  Allergies  Allergen Reactions  . Penicillins     Lip Swelling  . Zosyn [Piperacillin Sod-Tazobactam So]     Social History   Socioeconomic History  . Marital status: Divorced    Spouse name: Not on file  . Number of children: Not on file  . Years of education: Not on file  . Highest education level: Not on file  Occupational History  . Not on file  Social Needs  . Financial resource strain: Not on file  . Food insecurity:    Worry: Not on file    Inability: Not on file  . Transportation needs:    Medical: Not on file    Non-medical: Not on file  Tobacco Use  . Smoking status: Former Research scientist (life sciences)  . Smokeless tobacco: Never Used  Substance and Sexual Activity  . Alcohol use: No    Frequency: Never  . Drug use: No  . Sexual activity: Not on file  Lifestyle  . Physical activity:    Days per week: Not on file    Minutes per session: Not on file  . Stress: Not on file  Relationships  . Social connections:    Talks on phone: Not on file    Gets together: Not on file    Attends religious service: Not on file    Active member of club or organization: Not on file    Attends meetings of clubs or organizations: Not on file    Relationship status: Not on file  . Intimate partner violence:    Fear of current or ex partner: Not on file    Emotionally abused: Not on file    Physically abused: Not on file    Forced sexual activity: Not on file  Other Topics Concern  . Not on file  Social History Narrative  . Not on file     History reviewed. No pertinent family history.   Review of Systems Unable to assess Labs: Recent Labs    06/29/18 2217  TROPONINI <0.03   Lab Results  Component Value Date   WBC 15.5 (H) 06/30/2018   HGB 11.4 (L) 06/30/2018   HCT  36.3 (L) 06/30/2018   MCV 84.2 06/30/2018   PLT 176 06/30/2018    Recent Labs  Lab 06/30/18 0424  NA 139  K 3.2*  CL 95*  CO2 33*  BUN 33*  CREATININE 0.75  CALCIUM 8.3*  PROT 6.6  BILITOT 0.5  ALKPHOS 94  ALT 53*  AST 22  GLUCOSE 126*   No results found for: CHOL, HDL, LDLCALC, TRIG No results found for: DDIMER  Radiology/Studies:  Dg Chest 1 View  Result  Date: 06/29/2018 CLINICAL DATA:  Intubation.  Postop gastrostomy. EXAM: CHEST  1 VIEW COMPARISON:  06/30/2018 FINDINGS: Endotracheal tube in good position. Left subclavian central venous catheter tip mid SVC. No pneumothorax. Left lower lobe infiltrate unchanged, possible pneumonia. No effusion. COPD Large pneumoperitoneum again noted and unchanged IMPRESSION: Central line SVC. No pneumothorax. Endotracheal tube in good position. Left lower lobe infiltrate Large pneumoperitoneum Electronically Signed   By: Franchot Gallo M.D.   On: 06/30/2018 18:24   Dg Chest 1 View  Addendum Date: 06/17/2018   ADDENDUM REPORT: 07/10/2018 16:58 ADDENDUM: These results were called by telephone at the time of interpretation on 06/30/2018 at 4:58 pm to Dr. Martha Clan , who verbally acknowledged these results. Electronically Signed   By: Franchot Gallo M.D.   On: 07/09/2018 16:58   Result Date: 06/20/2018 CLINICAL DATA:  Oxygen desaturation.  Endoscopy today EXAM: CHEST  1 VIEW COMPARISON:  06/07/2018 FINDINGS: Left lower lobe airspace disease, possible pneumonia. Minimal right lower lobe atelectasis. Negative for heart failure or edema. Apical scarring bilaterally Left arm PICC tip in the SVC. Large pneumoperitoneum IMPRESSION: 1. Left lower lobe infiltrate, possible aspiration pneumonia 2. Large pneumoperitoneum Electronically Signed: By: Franchot Gallo M.D. On: 06/30/2018 16:52   Dg Abd 1 View  Result Date: 06/27/2018 CLINICAL DATA:  Vomiting EXAM: ABDOMEN - 1 VIEW COMPARISON:  06/26/2018; 06/22/2018; CT abdomen and pelvis-06/13/2018  FINDINGS: Re-demonstrated mild patulous distension of the colon, grossly unchanged. No definite evidence of enteric obstruction. No supine evidence of pneumoperitoneum. No pneumatosis or portal venous gas. Gastrostomy tube overlies expected location of mid body of the stomach. Rectal tube overlies expected location of the rectum. No definitive abnormal intra-abdominal calcifications Limited visualization of lower thorax suggests lung hyperexpansion with potential trace bilateral effusions, incompletely evaluated. Re-demonstrated age-indeterminate compression deformities of the T12 and L2 vertebral bodies, incompletely evaluated. IMPRESSION: Similar findings most suggestive of ileus. Electronically Signed   By: Sandi Mariscal M.D.   On: 06/27/2018 07:05   Dg Abd 1 View  Result Date: 06/26/2018 CLINICAL DATA:  Post intubation. Vomiting. EXAM: ABDOMEN - 1 VIEW COMPARISON:  06/22/2018 FINDINGS: Gastrostomy tube in the left upper quadrant, unchanged in position. Catheter in the pelvis likely representing a rectal catheter. Skin clips along the midline. Improving postoperative pneumoperitoneum since previous study. Gas-filled nondistended colon likely representing ileus. Small bowel distension is resolving. Suggestion of small left pleural effusion. Degenerative changes in the spine and hips. Vascular calcifications. IMPRESSION: Appliances appear to be in satisfactory position. Improving postoperative pneumoperitoneum. Electronically Signed   By: Lucienne Capers M.D.   On: 06/26/2018 04:36   Dg Abd 1 View  Result Date: 06/22/2018 CLINICAL DATA:  Recent bowel perforation EXAM: ABDOMEN - 1 VIEW COMPARISON:  June 21, 2018 FINDINGS: There is less apparent pneumoperitoneum compared to 1 day prior. Patient has had abdominal surgery with skin staples overlying the mid abdomen. Nasogastric tube tip is in the stomach with the side port at the gastroesophageal junction. Gastrostomy catheter is positioned in the stomach.  There are loops of mildly dilated bowel in a pattern suggesting ileus. Bowel obstruction is not felt to be likely. Catheter is noted in the distal sigmoid region. Rectal thermometer in place. There is aortoiliac atherosclerosis. IMPRESSION: Suspect a degree of ileus. Bowel obstruction felt to be less likely. Less pneumoperitoneum evident compared to 1 day prior. Tube positions as described. Nasogastric tube side port is at the gastroesophageal junction. Advise advancing nasogastric tube 4-5 cm. There is aortoiliac atherosclerosis. Aortic Atherosclerosis (ICD10-I70.0).  Electronically Signed   By: Lowella Grip III M.D.   On: 06/22/2018 07:26   Dg Abd 1 View  Result Date: 06/16/2018 CLINICAL DATA:  Perforated bowel EXAM: ABDOMEN - 1 VIEW COMPARISON:  07/06/2018 FINDINGS: Large amount of pneumoperitoneum similar to previous study. This is consistent with the known history of perforated bowel. Scattered gas throughout the colon without significant large or small bowel distention. No radiopaque stones. Vascular calcifications. Catheter in the pelvis could represent bladder or rectal catheter. Degenerative changes in the spine. Surgical clips in the left upper quadrant. IMPRESSION: Large amount of pneumoperitoneum similar to previous study. No evidence of bowel obstruction. Electronically Signed   By: Lucienne Capers M.D.   On: 07/10/2018 21:22   Dg Abd 1 View  Result Date: 06/25/2018 CLINICAL DATA:  Low oxygen saturation.  Feeding tube placement. EXAM: ABDOMEN - 1 VIEW COMPARISON:  06/17/2018 FINDINGS: Large pneumoperitoneum Metal clips overlying the stomach. No gastrostomy tube is identified. Foley catheter in the bladder.  No acute bony abnormality. IMPRESSION: Large pneumoperitoneum. Metal clips overlying the stomach presumably related to gastrostomy tube placement. These results were called by telephone at the time of interpretation on 06/29/2018 at 4:56 pm to Dr. Rosey Bath, who verbally acknowledged these  results. Electronically Signed   By: Franchot Gallo M.D.   On: 07/03/2018 16:58   Dg Abd 1 View  Result Date: 06/17/2018 CLINICAL DATA:  NG tube placement. EXAM: ABDOMEN - 1 VIEW COMPARISON:  06/17/2018. FINDINGS: Dobbhoff tube noted with its tip now noted over the distal stomach. No bowel distention. Multifocal bilateral subsegmental atelectasis/pulmonary mild infiltrates. IMPRESSION: 1. Dobbhoff tube noted with its tip now noted over the distal stomach. 2. Multifocal mild bilateral subsegmental atelectasis/pulmonary infiltrates. Electronically Signed   By: Marcello Moores  Register   On: 06/17/2018 15:25   Dg Abd 1 View  Result Date: 06/17/2018 CLINICAL DATA:  Feeding tube placement EXAM: ABDOMEN - 1 VIEW COMPARISON:  CT abdomen 06/13/2018 FINDINGS: The feeding tube extends into the stomach and is facing cephalad in the stomach fundus region. Tubing projecting over the right chest likely represents a PICC line. Emphysema noted.  Compression fractures observed at T12 and L2. IMPRESSION: 1. Feeding tube tip is in the stomach fundus, oriented cephalad. 2.  Emphysema (ICD10-J43.9). 3. Compression fractures at T12 and L2. Electronically Signed   By: Van Clines M.D.   On: 06/17/2018 12:39   Ct Head Wo Contrast  Result Date: 06/16/2018 CLINICAL DATA:  Altered mental status, history of seizures, failure to thrive, former smoker, history of prior subdural hematoma EXAM: CT HEAD WITHOUT CONTRAST TECHNIQUE: Contiguous axial images were obtained from the base of the skull through the vertex without intravenous contrast. Sagittal and coronal MPR images reconstructed from axial data set. COMPARISON:  None FINDINGS: Brain: Generalized cerebral and cerebellar atrophy. Normal ventricular morphology. No midline shift or mass effect. Small vessel chronic ischemic changes of deep cerebral white matter. 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 No intracranial hemorrhage, mass lesion, evidence of acute infarction, or  extra-axial fluid collection. Vascular: Mild atherosclerotic calcification of internal carotid arteries bilaterally at skull base. No hyperdense vessels. Skull: Demineralized but intact Sinuses/Orbits: Clear paranasal sinuses. Minimal fluid within a few RIGHT mastoid air cells. Other: N/A IMPRESSION: Atrophy with small vessel chronic ischemic changes of deep cerebral white matter. 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 No acute intracranial abnormalities. Electronically Signed   By: Lavonia Dana M.D.   On: 06/16/2018 15:32   Ct Chest W Contrast  Result Date:  06/13/2018 CLINICAL DATA:  Unintentional weight loss. EXAM: CT CHEST, ABDOMEN, AND PELVIS WITH CONTRAST TECHNIQUE: Multidetector CT imaging of the chest, abdomen and pelvis was performed following the standard protocol during bolus administration of intravenous contrast. CONTRAST:  73m OMNIPAQUE IOHEXOL 300 MG/ML  SOLN COMPARISON:  None. FINDINGS: CT CHEST FINDINGS Cardiovascular: Small pericardial effusion with a maximum thickness of 10 mm. Atheromatous calcifications, including the coronary arteries and aorta. Mediastinum/Nodes: No enlarged mediastinal, hilar, or axillary lymph nodes. Thyroid gland, trachea, and esophagus demonstrate no significant findings. Lungs/Pleura: The lungs are hyperexpanded with mild bullous changes. Irregular patchy densities in the right lower lobe and inferior right upper lobe. Minimal bilateral dependent atelectasis. No pleural fluid. Musculoskeletal: Approximately 30% T11 vertebral body superior endplate compression deformity with sclerosis and minimal bony retropulsion. No acute fracture lines. CT ABDOMEN PELVIS FINDINGS Hepatobiliary: No focal liver abnormality is seen. No gallstones, gallbladder wall thickening, or biliary dilatation. Pancreas: Unremarkable. No pancreatic ductal dilatation or surrounding inflammatory changes. Spleen: Normal in size without focal abnormality. Adrenals/Urinary Tract: Foley catheter in the  urinary bladder with associated air in the bladder. Multiple dependent calculi in the urinary bladder. These are difficult to separate from each other with the largest individual calculus that can be separated measuring 5 mm in diameter. Normal appearing adrenal glands, kidneys and ureters. Stomach/Bowel: Limited due to the lack of intravenous and oral contrast and lack of body fat. No gross abnormality of the stomach, small bowel or colon seen. No evidence of appendicitis. Vascular/Lymphatic: Atheromatous arterial calcifications without aneurysm. No enlarged lymph nodes. Reproductive: Normal sized prostate gland. Other: No abdominal wall hernia or abnormality. No abdominopelvic ascites. Musculoskeletal: Changes of avascular necrosis involving both femoral heads without bony collapse. Approximately 40% old L2 superior endplate compression deformity with minimal bony retropulsion, sclerosis and no acute fracture lines. Mild lumbar spine degenerative changes. IMPRESSION: 1. Patchy densities in the right lower lobe and inferior right upper lobe, compatible with incompletely resolved pneumonia. Follow-up chest radiographs are recommended until this completely clears. 2. Mild-to-moderate changes of COPD. 3. Small pericardial effusion. 4. Multiple dependent calculi in the urinary bladder. 5. 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 Aortic Atherosclerosis (ICD10-I70.0) and Emphysema (ICD10-J43.9). Electronically Signed   By: SClaudie ReveringM.D.   On: 06/13/2018 15:45   Ct Abdomen Pelvis W Contrast  Result Date: 06/30/2018 CLINICAL DATA:  72year old male with Proteus UTI, bacteremia and sepsis from chronic indwelling Foley. Acute hypoxic respiratory failure necessitating intubation. Bowel perforation status post exploratory laparotomy and repair of gastric perforation on 06/22/2018. EXAM: CT ABDOMEN AND PELVIS WITH CONTRAST TECHNIQUE: Multidetector CT imaging of the abdomen and pelvis was  performed using the standard protocol following bolus administration of intravenous contrast. CONTRAST:  675mOMNIPAQUE IOHEXOL 300 MG/ML  SOLN COMPARISON:  CT Abdomen and Pelvis 06/13/2018. FINDINGS: Lower chest: Lower lobe bronchiectasis appears progressed since September along with widespread peribronchial nodularity at both lung bases. Superimposed small layering left pleural effusion and atelectasis versus early consolidation in the posterior basal segment of the left lower lobe. No pericardial effusion. Hepatobiliary: The gallbladder is contracted. The gallbladder wall is mildly enhancing. The liver appears normal. Mild periportal edema or intrahepatic bile duct enlargement has regressed/resolved since September. Pancreas: Negative. Spleen: Negative. Adrenals/Urinary Tract: Negative adrenal glands. Bilateral renal enhancement and contrast excretion is symmetric and within normal limits. However, despite the presence of a catheter within the bladder the urinary bladder is distended with an estimated volume of 539 milliliters. There are multiple layering stones within  the bladder as well as a small volume of non dependent gas (series 2, image 62. There is mild bilateral hydronephrosis and proximal hydroureter. Stomach/Bowel: Decompressed large bowel from the transverse colon to the rectum. There may be a small volume of fluid within those segments of bowel. The cecum is mildly to moderately distended with gas and fluid. There appears to be abrupt transition of the large bowel caliber at the hepatic flexure on series 2, image 33. Oral contrast has reached the distal small bowel but not yet the terminal ileum. There are no dilated small bowel loops. There is a percutaneous gastrostomy. The stomach is mildly distended with contrast and otherwise unremarkable. The duodenum appears normal. No abdominal free air.  No definite free fluid. Vascular/Lymphatic: Aortoiliac calcified atherosclerosis. Major arterial  structures in the abdomen and pelvis are patent although right common iliac artery stenosis is identified on series 2, image 45. The portal venous system appears to remain patent. No lymphadenopathy identified. Reproductive: Urinary catheter in place.  Otherwise negative. Other: No pelvic free fluid. Postoperative changes to the ventral abdominal wall with no adverse features. Musculoskeletal: T12 and L2 compression fractures are stable since September. No acute osseous abnormality identified. IMPRESSION: 1. Abnormal lung bases with progressed Bronchiectasis and widespread peribronchial nodularity since the CT on 06/13/2018. There is a small layering left pleural effusion, with mild left lower lobe consolidation or atelectasis. This might reflect sequelae of bilateral aspiration, although hematogenously disseminated infection is also possible. 2. Distended urinary bladder despite the presence of a catheter. Estimated bladder volume 539 mL and mild associated bilateral hydronephrosis and proximal hydroureter. Multiple small stones are layering within the bladder. 3. Air and fluid distended cecum and ascending colon with abrupt transition to decompressed colon at the hepatic flexure. However, there is no small bowel dilatation to strongly indicate this is a mechanical bowel obstruction. Perhaps this is instead a large bowel ileus. 4. No free air or free fluid identified. 5. Aortic Atherosclerosis (ICD10-I70.0). Right common iliac artery stenosis. Electronically Signed   By: Genevie Ann M.D.   On: 06/30/2018 16:50   Ct Abdomen Pelvis W Contrast  Result Date: 06/13/2018 CLINICAL DATA:  Unintentional weight loss. EXAM: CT CHEST, ABDOMEN, AND PELVIS WITH CONTRAST TECHNIQUE: Multidetector CT imaging of the chest, abdomen and pelvis was performed following the standard protocol during bolus administration of intravenous contrast. CONTRAST:  38m OMNIPAQUE IOHEXOL 300 MG/ML  SOLN COMPARISON:  None. FINDINGS: CT CHEST  FINDINGS Cardiovascular: Small pericardial effusion with a maximum thickness of 10 mm. Atheromatous calcifications, including the coronary arteries and aorta. Mediastinum/Nodes: No enlarged mediastinal, hilar, or axillary lymph nodes. Thyroid gland, trachea, and esophagus demonstrate no significant findings. Lungs/Pleura: The lungs are hyperexpanded with mild bullous changes. Irregular patchy densities in the right lower lobe and inferior right upper lobe. Minimal bilateral dependent atelectasis. No pleural fluid. Musculoskeletal: Approximately 30% T11 vertebral body superior endplate compression deformity with sclerosis and minimal bony retropulsion. No acute fracture lines. CT ABDOMEN PELVIS FINDINGS Hepatobiliary: No focal liver abnormality is seen. No gallstones, gallbladder wall thickening, or biliary dilatation. Pancreas: Unremarkable. No pancreatic ductal dilatation or surrounding inflammatory changes. Spleen: Normal in size without focal abnormality. Adrenals/Urinary Tract: Foley catheter in the urinary bladder with associated air in the bladder. Multiple dependent calculi in the urinary bladder. These are difficult to separate from each other with the largest individual calculus that can be separated measuring 5 mm in diameter. Normal appearing adrenal glands, kidneys and ureters. Stomach/Bowel: Limited due to the lack  of intravenous and oral contrast and lack of body fat. No gross abnormality of the stomach, small bowel or colon seen. No evidence of appendicitis. Vascular/Lymphatic: Atheromatous arterial calcifications without aneurysm. No enlarged lymph nodes. Reproductive: Normal sized prostate gland. Other: No abdominal wall hernia or abnormality. No abdominopelvic ascites. Musculoskeletal: Changes of avascular necrosis involving both femoral heads without bony collapse. Approximately 40% old L2 superior endplate compression deformity with minimal bony retropulsion, sclerosis and no acute fracture  lines. Mild lumbar spine degenerative changes. IMPRESSION: 1. Patchy densities in the right lower lobe and inferior right upper lobe, compatible with incompletely resolved pneumonia. Follow-up chest radiographs are recommended until this completely clears. 2. Mild-to-moderate changes of COPD. 3. Small pericardial effusion. 4. Multiple dependent calculi in the urinary bladder. 5. 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 Aortic Atherosclerosis (ICD10-I70.0) and Emphysema (ICD10-J43.9). Electronically Signed   By: Claudie Revering M.D.   On: 06/13/2018 15:45   US Venous Img Lower Bilateral  Result Date: 06/30/2018 CLINICAL DATA:  Bilateral lower extremity edema. EXAM: BILATERAL LOWER EXTREMITY VENOUS DOPPLER ULTRASOUND TECHNIQUE: Gray-scale sonography with graded compression, as well as color Doppler and duplex ultrasound were performed to evaluate the lower extremity deep venous systems from the level of the common femoral vein and including the common femoral, femoral, profunda femoral, popliteal and calf veins including the posterior tibial, peroneal and gastrocnemius veins when visible. The superficial great saphenous vein was also interrogated. Spectral Doppler was utilized to evaluate flow at rest and with distal augmentation maneuvers in the common femoral, femoral and popliteal veins. COMPARISON:  None. FINDINGS: RIGHT LOWER EXTREMITY Common Femoral Vein: Echogenic wall thickening. Saphenofemoral Junction: Patent. Profunda Femoral Vein: No evidence of thrombus. Normal compressibility and flow on color Doppler imaging. Femoral Vein: Diffuse echogenic wall thickening. Popliteal Vein: Mild wall thickening. Calf Veins: Patent visualized segments. Superficial Great Saphenous Vein: No evidence of thrombus. Normal compressibility. Venous Reflux:  None. Other Findings:  None. LEFT LOWER EXTREMITY Common Femoral Vein: Echogenic wall thickening with patent lumen. Saphenofemoral Junction: Patent.  Profunda Femoral Vein: No evidence of thrombus. Normal compressibility and flow on color Doppler imaging. Femoral Vein: Diffuse wall thickening. Popliteal Vein: Wall thickening, luminal narrowing and poor flow. Calf Veins: Poor flow in calf veins. Superficial Great Saphenous Vein: No evidence of thrombus. Normal compressibility. Venous Reflux:  None. Other Findings:  None. IMPRESSION: Deep veins of both lower extremities demonstrate echogenic wall thickening consistent with prior thrombosis. There are no findings to suggest acute DVT and patent flow is present in the deep venous segments. There is poor/slow flow noted, however in the left popliteal vein and proximal left calf veins. Electronically Signed   By: Aletta Edouard M.D.   On: 06/30/2018 16:40   US Venous Img Upper Uni Right  Result Date: 06/24/2018 CLINICAL DATA:  Edema EXAM: RIGHT UPPER EXTREMITY VENOUS DOPPLER ULTRASOUND TECHNIQUE: Gray-scale sonography with graded compression, as well as color Doppler and duplex ultrasound were performed to evaluate the upper extremity deep venous system from the level of the subclavian vein and including the jugular, axillary, basilic and upper cephalic vein. Spectral Doppler was utilized to evaluate flow at rest and with distal augmentation maneuvers. COMPARISON:  None. FINDINGS: Thrombus within deep veins:  None visualized. Compressibility of deep veins:  Normal. Duplex waveform respiratory phasicity:  Normal. Duplex waveform response to augmentation:  Normal. Venous reflux:  None visualized. Other findings: On routine limited images of the contralateral left subclavian vein, incompletely occlusive thrombus is noted around PICC line.  IMPRESSION: 1. Negative for right upper extremity DVT. 2. POSITIVE for nonocclusive DVT in the left subclavian vein adjacent to PICC catheter. Electronically Signed   By: Lucrezia Europe M.D.   On: 06/24/2018 12:28   Dg Chest Port 1 View  Result Date: 06/30/2018 CLINICAL DATA:   Shortness of breath EXAM: PORTABLE CHEST 1 VIEW COMPARISON:  06/29/2018 FINDINGS: Heart size and pulmonary vascularity are normal. Severe emphysematous changes throughout the lungs with peribronchial thickening and central interstitial pattern likely representing chronic bronchitis. Mild residual patchy perihilar infiltration is improving since previous studies suggesting resolving pneumonia. Mild blunting of left costophrenic angle likely a small pleural effusion. No pneumothorax. Calcification of the aorta. A left PICC line is present with tip over the low SVC region. IMPRESSION: Severe emphysematous changes and chronic bronchitic changes in the lungs. Improving perihilar infiltration since previous study. Small left pleural effusion. Electronically Signed   By: Lucienne Capers M.D.   On: 06/30/2018 06:07   Dg Chest Port 1 View  Result Date: 06/29/2018 CLINICAL DATA:  Shortness of breath. EXAM: PORTABLE CHEST 1 VIEW COMPARISON:  06/28/2018. FINDINGS: Unchanged support tubes and lines. COPD with hyperinflation. BILATERAL pulmonary opacities show minimal improvement, particularly LEFT upper lobe. IMPRESSION: Slight improvement aeration. Electronically Signed   By: Staci Righter M.D.   On: 06/29/2018 07:41   Dg Chest Port 1 View  Result Date: 06/28/2018 CLINICAL DATA:  Acute respiratory failure. History of COPD. Patient admitted 06/13/2018 with pneumonia and urinary tract infection. EXAM: PORTABLE CHEST 1 VIEW COMPARISON:  Single-view of the chest 06/27/2018, 06/26/2018 and 06/23/2018. FINDINGS: Marked emphysema is again seen. Endotracheal tube and left PICC remain in place in good position. Patchy bilateral airspace disease is worse on the left and demonstrates continued improvement. No pneumothorax or pleural fluid. Heart size is normal. IMPRESSION: Continued improvement in bilateral pneumonia. Emphysema. Support apparatus projects in good position. Electronically Signed   By: Inge Rise M.D.   On:  06/28/2018 08:49   Dg Chest Port 1 View  Result Date: 06/27/2018 CLINICAL DATA:  Acute respiratory failure EXAM: PORTABLE CHEST 1 VIEW COMPARISON:  06/26/2018; 06/23/2018; 06/22/2018 FINDINGS: Grossly unchanged cardiac silhouette and mediastinal contours with atherosclerotic plaque within the thoracic aorta. Stable position of support apparatus. The lungs remain hyperexpanded with flattening the bilaterally diaphragms. Ill-defined heterogeneous interstitial and potential developing airspace opacities within the left mid and lower lung as well as the right mid lung are unchanged. No definite evidence of edema. There is blunting of the bilateral costophrenic angles suggestive of trace bilateral effusions. No pneumothorax. No definite acute osseus abnormalities. IMPRESSION: 1.  Stable positioning of support apparatus.  No pneumothorax. 2. Grossly unchanged bilateral airspace opacities, left greater than right, with broad differential considerations including aspiration and/or atypical infection. 3. Unchanged trace bilateral effusions. Electronically Signed   By: Sandi Mariscal M.D.   On: 06/27/2018 07:07   Dg Chest Port 1 View  Result Date: 06/26/2018 CLINICAL DATA:  Post intubation. Vomiting. EXAM: PORTABLE CHEST 1 VIEW COMPARISON:  06/26/2018 FINDINGS: Endotracheal tube placed with tip measuring 5.5 cm above the carina. Left PICC catheter with tip over the cavoatrial junction region. No pneumothorax. Heart size and pulmonary vascularity are normal. Patchy airspace disease in both mid and lower lungs, greater on the left. This could represent edema, ARDS, or aspiration. Small left pleural effusion. Calcification of the aorta. IMPRESSION: Appliances appear to be in satisfactory position. Bilateral airspace disease in the lungs, greater on the left. This could represent edema, ARDS, or  aspiration. Small left pleural effusion. Electronically Signed   By: Lucienne Capers M.D.   On: 06/26/2018 04:37   Dg Chest  Port 1 View  Result Date: 06/26/2018 CLINICAL DATA:  72 year old with history of shortness of breath. History of emphysema. EXAM: PORTABLE CHEST 1 VIEW COMPARISON:  Chest x-ray 06/23/2018. FINDINGS: There is a left upper extremity PICC with tip terminating in the distal superior vena cava. Previously noted endotracheal tube has been withdrawn. Diffuse peribronchial cuffing with patchy multifocal ill-defined airspace consolidation in the lungs bilaterally, most severe throughout the left upper lobe. Moderate left pleural effusion. Small right pleural effusion. Opacity at the left lung base which may reflect atelectasis and/or consolidation. Pulmonary vasculature does not appear engorged. Heart size is normal. Upper mediastinal contours are within normal limits. Aortic atherosclerosis. IMPRESSION: 1. Support apparatus, as above. 2. Worsening bronchitis with developing multilobar bronchopneumonia in the lungs bilaterally (left greater than right). 3. Increasing moderate left and small right pleural effusion. Electronically Signed   By: Vinnie Langton M.D.   On: 06/26/2018 01:58   Dg Chest Port 1 View  Result Date: 06/23/2018 CLINICAL DATA:  Ventilated patient.  History of emphysema. EXAM: PORTABLE CHEST 1 VIEW COMPARISON:  06/22/2018 FINDINGS: The enteric catheter has been removed. Left PICC line and endotracheal tube in stable position. Cardiomediastinal silhouette is normal. Mediastinal contours appear intact. Calcific atherosclerotic disease of the aorta. There is no evidence of pleural effusion or pneumothorax. Focal airspace consolidation the left lower lobe, more prominent than on the prior radiograph. Scattered peribronchial airspace consolidation in the right lower lobe. Baseline emphysematous changes. Osseous structures are without acute abnormality. Decreased volume of known pneumoperitoneum. IMPRESSION: Worsening of left lower thorax airspace consolidation. Stable appearance of patchy peribronchial  right lower lobe airspace consolidation. Decreased volume pneumoperitoneum. Electronically Signed   By: Fidela Salisbury M.D.   On: 06/23/2018 11:31   Dg Chest Port 1 View  Result Date: 06/22/2018 CLINICAL DATA:  Hypoxia EXAM: PORTABLE CHEST 1 VIEW COMPARISON:  June 21, 2018 FINDINGS: Endotracheal tube tip is 7.7 cm above the carina. Central catheter tip is in the superior cava. Nasogastric tube tip is in the stomach with the side port essentially at the gastroesophageal junction. No evident pneumothorax. Lungs are hyperexpanded. There is airspace consolidation consistent with pneumonia in the left lower lobe. There is patchy atelectatic change in the right mid lung and right base regions. The pneumoperitoneum seen 1 day prior is less apparent currently. Note skin staples in the upper abdomen indicating recent abdominal surgery. IMPRESSION: 1. Tube and catheter positions as described without pneumothorax. Side port of the nasogastric tube is at the gastroesophageal junction. It may be prudent to consider advancing the nasogastric tube 4-5 cm. 2. Lungs hyperexpanded. Airspace consolidation left lower lobe consistent with pneumonia. Areas of patchy atelectasis right mid lung and right base. 3.  Pneumoperitoneum less well appreciable compared to 1 day prior. 4.  Stable cardiac silhouette. Electronically Signed   By: Lowella Grip III M.D.   On: 06/22/2018 07:24   Dg Chest Portable 1 View  Result Date: 06/02/2018 CLINICAL DATA:  Cough today with cloudy urine and recent diagnosis of right lower lobe pneumonia 3 weeks ago. History of emphysema and former smoker. EXAM: PORTABLE CHEST 1 VIEW COMPARISON:  None. FINDINGS: Emphysematous hyperinflation of lungs. The patient is slightly rotated on this study accentuating the paramediastinal soft tissues on the right. Partial clearing of previously noted right lower lobe pneumonia. Minimal right upper lobe pneumonia is also noted,  similar in appearance to prior  abutting the minor fissure. No new pulmonary consolidations. No effusion or pneumothorax. Mild aortic atherosclerosis. No acute osseous abnormality. IMPRESSION: Partial clearing of right sided pneumonia in the background of emphysema. Electronically Signed   By: Ashley Royalty M.D.   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 Evaluate for integrity of repair. EXAM: WATER SOLUBLE UPPER GI SERIES TECHNIQUE: Single-column upper GI series was performed using water soluble contrast. CONTRAST:  131m ISOVUE-300 IOPAMIDOL (ISOVUE-300) INJECTION 61% COMPARISON:  None FLUOROSCOPY TIME:  Fluoroscopy Time:  3 minutes 18 seconds Radiation Exposure Index (if provided by the fluoroscopic device): 22.7 mGy Number of Acquired Spot Images: 0 FINDINGS: Real-time fluoroscopy was utilized for evaluation of the stomach. Isovue 300 contrast was hand injected through an existing nasogastric tube opacifying the stomach. Balloon from the PEG tube is delineated within the stomach. Patient was position in the right lateral decubitus position for contrast to flow into the body and antrum of the stomach. No extraluminal contrast is identified to suggest persistent perforation. IMPRESSION: No extraluminal contrast to suggest active gastric perforation. Electronically Signed   By: HKathreen Devoid  On: 06/22/2018 13:10   Dg Swallowing Func-speech Pathology  Result Date: 06/28/2018 Please refer to "Notes" tab for Speech Pathology notes.  [place] Site Rite  Result Date: 06/23/2018 If Site Rite image not attached, placement could not be confirmed due to current cardiac rhythm.  [place] Site Rite  Result Date: 06/13/2018 If Site Rite image not attached, placement could not be confirmed due to current cardiac rhythm.   EKG: Normal sinus rhythm  Weights: Filed Weights   06/27/18 0500 06/28/18 0444 06/30/18 0426   Weight: 40.5 kg 40.7 kg 41.1 kg     Physical Exam: Blood pressure 139/84, pulse 82, temperature 98.5 F (36.9 C), temperature source Oral, resp. rate 13, height '5\' 11"'  (1.803 m), weight 41.1 kg, SpO2 98 %. Body mass index is 12.64 kg/m. General: Ill-appearing cachectic, in no acute distress. Head eyes ears nose throat: Normocephalic, atraumatic, sclera non-icteric, no xanthomas, nares are without discharge. No apparent thyromegaly and/or mass  Lungs: Normal respiratory effort.  no wheezes, no rales, no rhonchi.  Heart: RRR with normal S1 S2. no murmur gallop, no rub, PMI is normal size and placement, carotid upstroke normal without bruit, jugular venous pressure is normal Abdomen: Soft, non-tender, non-distended with normoactive bowel sounds. No hepatomegaly. No rebound/guarding. No obvious abdominal masses. Abdominal aorta is normal size without bruit Extremities: No edema. no cyanosis, no clubbing, no ulcers  Peripheral : 2+ bilateral upper extremity pulses, 2+ bilateral femoral pulses, 2+ bilateral dorsal pedal pulse Neuro: Not alert and oriented. No facial asymmetry. No focal deficit. Moves all extremities spontaneously. Musculoskeletal: Normal muscle tone with kyphosis Psych: Does not responds to questions appropriately with a normal affect.    Assessment: 72year old male with paroxysmal nonvalvular atrial fibrillation likely triggered by hypoxia sepsis bacteremia without evidence of myocardial infarction or congestive heart failure  Plan: 1.  Continue amiodarone load overnight intravenously and potentially switch to 200 mg of oral amiodarone thereafter for maintenance of normal sinus rhythm for 1 month and possible discontinuation after that if the patient is evidently improved from current condition without evidence of infection 2.  Continuation of metoprolol orally 25 mg twice per day and possibly increasing dose in the next few days if necessary 3.  No  further cardiac  diagnostics v necessary at this time  Signed, Corey Skains M.D. [facility] 06/30/2018, 6:11 PM

## 2018-06-30 NOTE — Progress Notes (Signed)
PULMONARY / CRITICAL CARE MEDICINE   Name: Ariz Terrones MRN: [id number] DOB: 1945-12-03    ADMISSION DATE:  05/24/2018 CONSULTATION DATE:  07-06-2018  REFERRING MD:  Dr. Maximino Greenland  CHIEF COMPLAINT:  Acute Hypoxic Respiratory Failure  SHORT DISCUSSION: 72 y.o. Male admitted with Proteus UTI , Bacteremia, and sepsis from chronic indwelling foley.  Pt has a history of Chronic subdural hematoma, chronic aspiration, and severe malnutrition.  On 10/8 he underwent EGD for PEG placement, of which attempt was aborted due to blade entering the stomach.  Post procedure pt developed severe Acute Hypoxic Respiratory Failure necessitating intubation.  Follow up CXR with LLL infiltrate concerning for aspiration.  Follow up abdominal x-ray with large acute pneumoperitoneum concerning for possible abdominal perforation. Therefore, surgery consulted pt underwent gastrorrhaphy and gastrostomy he remained intubated postop.    CULTURES: Blood 9/25>> Proteus Mirabilis (pan sensitive), Clostridium species Urine 9/25>> Proteus Mirabilis (resistant to Nitrofurantoin) Sputum 10/8>> Rare candida albicans   ANTIBIOTICS: Rocephin 9/25>> 9/30 Flagyl 9/28>> 9/30 Augmentin 10/7>>10/7 Unasyn 9/30>>10/8 Zosyn 10/8>>10/11 Vancomycin 10/8>>10/8 Meropenum 10/11>>  SIGNIFICANT EVENTS: 9/25>> Admission to [facility] 10/8>> EGD with attempt at PEG tube, aborted due to blade entering the stomach  10/8>> hypoxic post procedure requiring intubation 10/8>> large neumoperitoneum on CXR with questionable abdominal perforation 10/9>> underwent gastrorrhaphy and gastrostomy due to gastric laceration during PEG tube placement attempt  10/10>> Pt successfully intubated  10/12>> Pt transferred out of ICU to telemetry unit  10/13>> Pt transferred back to ICU with respiratory failure secondary to aspiration requiring mechanical intubation 1014: Stool occult positive Lovenox was held 1016: Extubated 06/30/2018: Low-grade fever status  post CT abdomen, ID consult was called  LINES/TUBES: ETT 07/06/18>>10/10 Left Brachial PICC 06/13/18>> ETT 10/13>> REVIEW OF SYSTEMS:   Unable to assess pt intubated   SUBJECTIVE:  -Patient is liberated from ventilator doing well --1 L in last 24 hours but still +8500, potassium 3.2 replaced, WBC mildly went up to 15.5, low-grade fever of 100.2 noted - Overnight went into A. fib with RVR managed with amiodarone, not able to tell me or complain about anything - Currently on amiodarone drip  VITAL SIGNS: BP 136/78   Pulse 88   Temp 98 F (36.7 C) (Oral)   Resp 14   Ht 5\' 11"  (1.803 m)   Wt 41.1 kg   SpO2 98%   BMI 12.64 kg/m   HEMODYNAMICS:    VENTILATOR SETTINGS: FiO2 (%):  [32 %] 32 %  INTAKE / OUTPUT: I/O last 3 completed shifts: In: 2135.8 [I.V.:465.7; Other:90; NG/GT:1280; IV Piggyback:300.1] Out: 4800 [Urine:4800]  PHYSICAL EXAMINATION: General: acutely ill appearing male, NAD mechanically intubated  Neuro: sedated not following commands, PERRL  HEENT: supple, JVD present  Cardiovascular: irregular irregular, no R/G Lungs: diffuse crackles and rhonchi throughout, even, non labored  Abdomen: +BS x4, soft, non distended, LUQ G tube present dressing dry and intact  Musculoskeletal: severely cachetic  Skin: midline abdominal incision with honeycomb dressing intact  LABS:  BMET Recent Labs  Lab 06/28/18 0436 06/29/18 0435 06/29/18 2217 06/30/18 0424  NA 137 138  --  139  K 3.0* 3.6 3.6 3.2*  CL 98 100  --  95*  CO2 29 31  --  33*  BUN 22 25*  --  33*  CREATININE 0.78 0.75  --  0.75  GLUCOSE 127* 119*  --  126*    Electrolytes Recent Labs  Lab 06/26/18 0448 06/27/18 0519 06/28/18 0436 06/29/18 0435 06/29/18 2217 06/30/18 0424  CALCIUM 7.6* 7.4*  7.8* 7.7*  --  8.3*  MG 1.9 1.6* 2.4 2.1 2.1 2.0  PHOS 3.6 3.0  --  2.1*  --   --     CBC Recent Labs  Lab 06/28/18 0436 06/29/18 0435 06/30/18 0424  WBC 11.8* 9.2 15.5*  HGB 10.4* 8.9* 11.4*   HCT 31.3* 27.5* 36.3*  PLT 145* 124* 176    Coag's No results for input(s): APTT, INR in the last 168 hours.  Sepsis Markers No results for input(s): LATICACIDVEN, PROCALCITON, O2SATVEN in the last 168 hours.  ABG Recent Labs  Lab 06/29/18 0500 06/29/18 1140 06/30/18 0424  PHART 7.58* 7.59* 7.56*  PCO2ART 35 34 34  PO2ART 98 96 76*    Liver Enzymes Recent Labs  Lab 06/29/18 0435 06/30/18 0424  AST 22 22  ALT 52* 53*  ALKPHOS 67 94  BILITOT 0.4 0.5  ALBUMIN 1.7* 2.4*    Cardiac Enzymes Recent Labs  Lab 06/29/18 2217  TROPONINI <0.03    Glucose Recent Labs  Lab 06/26/18 1145 06/26/18 1549 06/26/18 1936 06/26/18 2321 06/27/18 0353 06/27/18 0721  GLUCAP 118* 122* 104* 117* 80 127*    Imaging Dg Chest Port 1 View  Result Date: 06/30/2018 CLINICAL DATA:  Shortness of breath EXAM: PORTABLE CHEST 1 VIEW COMPARISON:  06/29/2018 FINDINGS: Heart size and pulmonary vascularity are normal. Severe emphysematous changes throughout the lungs with peribronchial thickening and central interstitial pattern likely representing chronic bronchitis. Mild residual patchy perihilar infiltration is improving since previous studies suggesting resolving pneumonia. Mild blunting of left costophrenic angle likely a small pleural effusion. No pneumothorax. Calcification of the aorta. A left PICC line is present with tip over the low SVC region. IMPRESSION: Severe emphysematous changes and chronic bronchitic changes in the lungs. Improving perihilar infiltration since previous study. Small left pleural effusion. Electronically Signed   By: Burman Nieves M.D.   On: 06/30/2018 06:07   STUDIES: . CT  Chest / Abdomen / Pelvis 06/13/18>> Patchy densities in the right lower lobe and inferior right upper lobe, compatible with incompletely resolved pneumonia. Follow-up chest radiographs are recommended until this completely clears. Mild-to-moderate changes of COPD. Small pericardial effusion.  Multiple dependent calculi in the urinary bladder. 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  CT Head wo Contrast 06/16/18>> No acute intracranial abnormalities. 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 KUB 2018/07/17>> Large pneumoperitoneum. Metal clips overlying the stomach presumably related to gastrostomy tube placement.    ASSESSMENT / PLAN:  Acute hypoxic respiratory failure secondary to pneumonia and pulmonary edema  Edema of lower lip possible allergic reaction-resolved  Mechanical ventilation  Hx: Chronic aspiration and Emphysema  -Currently in 2 to 3 L oxygen doing well -Currently on p.o. prednisone- taper to 20 mg daily -Patient has severe alkalosis so we will hold further Lasix and give a dose of Diamox - Echo reviewed EF is about 45%, keeping negative balance -VAP bundle implemented   Severe sepsis/proteus UTI/bacteremia  Aspiration pneumonia with bibasilar infiltrates (left greater than right) Trend WBC and monitor fever curve  Trend PCT Continue meropenum- last on from Jul 17, 2018- patient has low-grade fever last blood cultures has been negative, due to ongoing issues with fever and history of some perforation will consider abdominal CT, will also consider getting a DVT study to evaluate for noninfectious causes of fever, will get ID consultation if everything is okay consideration of stopping of all antibiotics, if abdominal CT suggest some kind of  abscess will benefit for drainage - Repeat blood cultures if negative-follow ID recommendation  Atrial fibrillation with rvr  LUE non occlusive subclavian DVT associated with PICC-finding on [place] 06/24/18  Continuous telemetry monitoring  Doing well currently on metoprolol - Currently on subcu heparin -Overnight went back in A. fib with RVR currently on amiodarone drip -Due to on and off issues we will consult cardiology -  Patient had significant anemia and drop in hemoglobin to 5.6 so not a candidate for anti-Coblation at this point currently hemoglobin is stable around 11.4, if continue to remain stable long-term AC per cardiology  Hypomagnesia Trend BMP Replace electrolytes as indicated-potassium replaced Monitor UOP  Anemia without acute blood loss Trend CBC  Monitor for s/sx of bleeding and transfuse for hgb <7 No further bleeding was noted  Gastric laceration s/p gastrorrhaphy and gastrostomy 06/22/18 Vomiting  Severe malnutrition related to chronic illness  Continue TF's for now  Continue bowel regimen  SUP px: pepcid -Feeding on hold for weaning - Adjustment of feeds were done by nutrition  -More awake and following - Palliative attempted to speak with the family with out success, palliative again attempted to speak with the patient however patient was not able to participate in decision-making, as per last conversation with son patient remains partial code but okay with intubation if not doing well   -Patient continued to do okay post extubation, currently on 2 to 3 L oxygen, amiodarone drip can be managed on telemetry floor with the help of cardiology, will consider transferring the patient to telemetry with ID and cardiology follow-up -We will be available as needed please call us back if he can be of further help   Skin/Wound: Chronic changes  Electrolytes: Replace electrolytes per ICU electrolyte replacement protocol.   IVF: none  Nutrition: Tube feeds as tolerated  Prophylaxis: DVT Prophylaxis with heparin,. GI Prophylaxis.   Restraints: Soft limb  PT/OT eval and treat. OOB when appropriate.   Lines/Tubes: 928 Foley 930 PICC line central line.  ADVANCE DIRECTIVE: Partial code  FAMILY DISCUSSION: Palliative is going to speak with the son today to decide goals of care  Quality Care: PPI, DVT prophylaxis, HOB elevated, Infection control all reviewed and addressed.  Events  and notes from last 24 hours reviewed. Care plan discussed on multidisciplinary rounds  CC TIME: 40 minutes   Old records reviewed discussed results and management plan with patient  Images personally reviewed and results and labs reviewed and discussed with patient.  All medication reviewed and adjusted  Further management depending on test results and work up as outlined above.    Roseanne Reno, M.D

## 2018-06-30 NOTE — Consult Note (Signed)
Pharmacy Antibiotic Note  Alexander Lara is a 72 y.o. male admitted on 06/10/2018 with pneumonia.  Pharmacy has been consulted for Meropenem dosing. Admitted with on 9/25 for UTI and PNA . Patient underwent EGD on 10/8. Patient had hypoxia after procedure; O2 sats in the 60s and was transferred to ICU. Patient extubated 10/16.   Plan: Continue meropenem 1 g IV Q12h for aspiration PNA x 2 weeks (stop 10/20)  total per ICU rounds discussion. Patient is on day 9 of broad aspiration coverage (Zosyn was switched to Meropenem due to PCN allergy).   Height: 5\' 11"  (180.3 cm) Weight: 90 lb 9.7 oz (41.1 kg) IBW/kg (Calculated) : 75.3  Temp (24hrs), Avg:99.7 F (37.6 C), Min:98 F (36.7 C), Max:100.4 F (38 C)  Recent Labs  Lab 06/26/18 0448 06/27/18 0519 06/28/18 0436 06/29/18 0435 06/30/18 0424  WBC 9.6 7.5 11.8* 9.2 15.5*  CREATININE 0.62 0.70 0.78 0.75 0.75    Estimated Creatinine Clearance: 47.8 mL/min (by C-G formula based on SCr of 0.75 mg/dL).    Allergies  Allergen Reactions  . Penicillins     Lip Swelling  . Zosyn [Piperacillin Sod-Tazobactam So]     Antimicrobials this admission: 10/11 Merrem >> 10/20 10/9 Zosyn >> 10/11 10/7 Augmentin >> 10/8 9/30 Unasyn >>10/8 9/28 Flagyl >> 09/30 9/26 Ceftriaxone >> 9/30   Microbiology results: 10/15 BCx: no growth 2 days  9/25 BCx: Proteus - pansensitive  9/25 UCx: Proteus mirabilis resistant to Macrobid  10/8 Sputum: rare candida albicans  10/8 MRSA PCR: (-)  Thank you for allowing pharmacy to be a part of this patient's care.  Mauri Reading, PharmD Pharmacy Resident  06/30/2018 1:32 PM

## 2018-06-30 NOTE — Progress Notes (Signed)
Daily Progress Note   Patient Name: Alexander Lara       Date: 06/30/2018 DOB: 10/03/1945  Age: 72 y.o. MRN#: [id number] Attending Physician: Epifanio Lesches, MD Primary Care Physician: [facility] Admit Date: 06/12/2018  Reason for Consultation/Follow-up:  Received request by CCM to attempt [place] conversation with hopes that he could speak for himself as patient has been extubated.   Subjective: Patient is resting in bed. Primary RN Rachael at bedside. Asked patient how he was doing and he stared forward with head resting on pillow and did not respond. Asked him if he has pain. He continued to stare forward with head on pillow and did not respond. Asked him if there was anything he needed with no response,  He did not turn eyes or head to look at this writer or the nurse. Unfortunately, unable to have [place] conversation with the patient. This does not create a place for palliative to assist at this time.    Length of Stay: 21  Current Medications: Scheduled Meds:  . chlorhexidine gluconate (MEDLINE KIT)  15 mL Mouth Rinse BID  . famotidine  20 mg Per Tube BID  . free water  30 mL Per Tube Q4H  . furosemide  40 mg Per Tube BID  . heparin injection (subcutaneous)  5,000 Units Subcutaneous Q8H  . ipratropium  0.5 mg Nebulization Q6H  . levETIRAcetam  500 mg Per Tube BID  . mouth rinse  15 mL Mouth Rinse 10 times per day  . metoprolol tartrate  12.5 mg Per Tube BID  . multivitamin  15 mL Per Tube Daily  . predniSONE  40 mg Per Tube Q breakfast  . sodium chloride flush  10-40 mL Intracatheter Q12H  . vitamin C  250 mg Per Tube BID    Continuous Infusions: . sodium chloride 250 mL (06/26/18 0930)  . amiodarone 60 mg/hr (06/30/18 0905)  . feeding supplement (VITAL 1.5 CAL)  1,000 mL (06/29/18 1400)  . meropenem (MERREM) IV 1 g (06/30/18 1016)    PRN Meds: sodium chloride, acetaminophen, albuterol, fentaNYL (SUBLIMAZE) injection, metoprolol tartrate, [DISCONTINUED] ondansetron **OR** ondansetron (ZOFRAN) IV, polyvinyl alcohol, sodium chloride, sodium chloride flush  Physical Exam  Constitutional: No distress.  Pulmonary/Chest:  Extubated  Neurological: He is alert.  Skin: Skin is warm and dry.  Vital Signs: BP (!) 153/82   Pulse 66   Temp 100.2 F (37.9 C)   Resp 16   Ht '5\' 11"'  (1.803 m)   Wt 41.1 kg   SpO2 93%   BMI 12.64 kg/m  SpO2: SpO2: 93 % O2 Device: O2 Device: Nasal Cannula O2 Flow Rate: O2 Flow Rate (L/min): 4 L/min  Intake/output summary:   Intake/Output Summary (Last 24 hours) at 06/30/2018 1021 Last data filed at 06/30/2018 0600 Gross per 24 hour  Intake 1583.82 ml  Output 3650 ml  Net -2066.18 ml   LBM: Last BM Date: 06/28/18 Baseline Weight: Weight: 37.5 kg Most recent weight: Weight: 41.1 kg       Palliative Assessment/Data: 10%    Flowsheet Rows     Most Recent Value  Intake Tab  Referral Department  Hospitalist  Unit at Time of Referral  Cardiac/Telemetry Unit  Palliative Care Primary Diagnosis  Sepsis/Infectious Disease  Date Notified  06/09/18  Palliative Care Type  New Palliative care  Reason for referral  Clarify Goals of Care, Counsel Regarding Hospice  Date of Admission  05/26/2018  Date first seen by Palliative Care  06/09/18  # of days Palliative referral response time  0 Day(s)  # of days IP prior to Palliative referral  1  Clinical Assessment  Psychosocial & Spiritual Assessment  Palliative Care Outcomes      Patient Active Problem List   Diagnosis Date Noted  . Pressure injury of skin 06/29/2018  . Recurrent UTI (urinary tract infection) 06/09/2018  . Adult failure to thrive   . Palliative care by specialist   . Severe protein-calorie malnutrition ([place]) 05/17/2018  . Sepsis ([place])  05/14/2018    Palliative Care Assessment & Plan    Recommendations/Plan:  Continue care. [name] has 2 brothers. Recommend attempting to speak with the brothers to offer decision making capacity if they would like. Per EMR CM note, the phone numbers for the brothers have not been provided.     Code Status:    Code Status Orders  (From admission, onward)         Start     Ordered   06/24/18 0440  Limited resuscitation (code)  Continuous    Question Answer Comment  In the event of cardiac or respiratory ARREST: Initiate Code Blue, Call Rapid Response No   In the event of cardiac or respiratory ARREST: Perform CPR No   In the event of cardiac or respiratory ARREST: Perform Intubation/Mechanical Ventilation Yes   In the event of cardiac or respiratory ARREST: Use NIPPV/BiPAp only if indicated Yes   In the event of cardiac or respiratory ARREST: Administer ACLS medications if indicated No   In the event of cardiac or respiratory ARREST: Perform Defibrillation or Cardioversion if indicated No      06/24/18 0439        Code Status History    Date Active Date Inactive Code Status Order ID Comments User Context   06/10/2018 1121 06/24/2018 0439 DNR [id number]  Asencion Gowda, NP Inpatient   06/09/2018 0209 06/10/2018 1121 DNR [id number]  Arta Silence, MD ED   05/15/2018 1120 05/18/2018 2303 Full Code [id number]  Dustin Flock, MD Inpatient   05/15/2018 0125 05/15/2018 1120 Full Code [id number]  Amelia Jo, MD Inpatient    Advance Directive Documentation     Most Recent Value  Type of Advance Directive  Healthcare Power of Attorney  Pre-existing out of facility DNR order (yellow form or pink MOST form)  -  "  MOST" Form in Place?  -       Prognosis:   Unable to determine  Discharge Planning:  To Be Determined  Care plan was discussed with RN and CM.  Thank you for allowing the Palliative Medicine Team to assist in the care of this patient.   Total Time 15 min Prolonged  Time Billed no      Greater than 50%  of this time was spent counseling and coordinating care related to the above assessment and plan.  Asencion Gowda, NP  Please contact Palliative Medicine Team phone at [phone] for questions and concerns.
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
## 2018-06-30 NOTE — Consult Note (Addendum)
Pharmacy Electrolyte Monitoring Consult:  Pharmacy consulted to assist in monitoring and replacing electrolytes in this 72 y.o. male admitted on 07/04/2018 with Urinary Retention  Patient admitted to ICU s/p PEG placement.   Pt with AFib  Labs:  Sodium (mmol/L)  Date Value  06/30/2018 139   Potassium (mmol/L)  Date Value  06/30/2018 3.2 (L)   Magnesium (mg/dL)  Date Value  16/06/9603 2.0   Phosphorus (mg/dL)  Date Value  54/05/8118 2.1 (L)   Calcium (mg/dL)  Date Value  14/78/2956 8.3 (L)   Albumin (g/dL)  Date Value  21/30/8657 2.4 (L)   Corrected Calcium: 9.58  Assessment/Plan: Electrolyte goals: Potassium ~4.0, Magnesium ~2.0 due to afib.  Patient continued on furosemide 40mg  per tube BID; also received furosemide 20 mg IV x 1 today. Acetazolamide 500 mg IV x 1 ordered.  Ordered potassium per tube Q4hr x 2 doses.   Will order BMP with AM labs.  Pharmacy will continue to monitor and adjust per consult.    Mauri Reading, PharmD Pharmacy Resident  06/30/2018 1:36 PM

## 2018-06-30 NOTE — Progress Notes (Signed)
Pt underwent CT abdomen/pelvis this afternoon.  CT scan is concerning for: 1) bilateral aspiration vs disseminated infection 2) Mild bilateral hydronephrosis and proximal hydroureter, and multiple stones in the bladder 3) Large bowel ileus   Pt is already on Meropenem, spoke with pharmacy, will continue for aspiration coverage  Consulted Urology.  Spoke with Dr. Richardo Hanks.  He recommends aggressively irrigating the coude' catheter, and then performing bladder scan.  If residual remains, then to advance catheter and then aggressively irrigate again.  If follow bladder scan again shows residual, then replace coude' with a 20 Fr Coude' catheter.  If still with residual urine, then contact Dr. Richardo Hanks again and he will to bedside tonight and assess pt.  Consulted General surgery.  Spoke with Dr. Aleen Campi.  He recommends bowel rest and holding tube feeds, and placing G-tube to gravity drainage.  Dr. Trisha Mangle will see pt in the morning 07/01/18   Gave order to nursing staff for irrigation of coude and if needed to replace the coude'.  Will follow up with bladder scan.   Updated pt's fiance' at bedside of CT findings and plan of care regarding each issue.  She is in agreement and appreciative.   Harlon Ditty, AGACNP-BC [facility] Pager: [phone]

## 2018-07-01 ENCOUNTER — Inpatient Hospital Stay: Payer: Medicare Other

## 2018-07-01 DIAGNOSIS — R109 Unspecified abdominal pain: Secondary | ICD-10-CM

## 2018-07-01 LAB — BASIC METABOLIC PANEL
ANION GAP: 11 (ref 5–15)
BUN: 31 mg/dL — AB (ref 8–23)
CHLORIDE: 96 mmol/L — AB (ref 98–111)
CO2: 29 mmol/L (ref 22–32)
Calcium: 8 mg/dL — ABNORMAL LOW (ref 8.9–10.3)
Creatinine, Ser: 0.9 mg/dL (ref 0.61–1.24)
GFR calc Af Amer: >60 mL/min (ref >60)
GFR calc non Af Amer: >60 mL/min (ref >60)
GLUCOSE: 83 mg/dL (ref 70–99)
POTASSIUM: 3.2 mmol/L — AB (ref 3.5–5.1)
SODIUM: 136 mmol/L (ref 135–145)

## 2018-07-01 LAB — POTASSIUM: POTASSIUM: 3.6 mmol/L (ref 3.5–5.1)

## 2018-07-01 LAB — CBC
HCT: 30.9 % — ABNORMAL LOW (ref 39.0–52.0)
HEMOGLOBIN: 10.3 g/dL — AB (ref 13.0–17.0)
MCH: 27.5 pg (ref 26.0–34.0)
MCHC: 33.3 g/dL (ref 30.0–36.0)
MCV: 82.6 fL (ref 80.0–100.0)
NRBC: 0 % (ref 0.0–0.2)
Platelets: 147 10*3/uL — ABNORMAL LOW (ref 150–400)
RBC: 3.74 MIL/uL — AB (ref 4.22–5.81)
RDW: 19.7 % — ABNORMAL HIGH (ref 11.5–15.5)
WBC: 17.1 10*3/uL — ABNORMAL HIGH (ref 4.0–10.5)

## 2018-07-01 LAB — BRAIN NATRIURETIC PEPTIDE: B Natriuretic Peptide: 303 pg/mL — ABNORMAL HIGH (ref 0.0–100.0)

## 2018-07-01 LAB — MAGNESIUM: MAGNESIUM: 1.8 mg/dL (ref 1.7–2.4)

## 2018-07-01 MED ORDER — OXYCODONE-ACETAMINOPHEN 5-325 MG PO TABS
1.0000 | ORAL_TABLET | Freq: Three times a day (TID) | ORAL | Status: DC | PRN
  Administered 2018-07-01 – 2018-07-03 (×5): 1 via ORAL
  Filled 2018-07-01 (×7): qty 1

## 2018-07-01 MED ORDER — IPRATROPIUM BROMIDE 0.02 % IN SOLN
0.5000 mg | Freq: Three times a day (TID) | RESPIRATORY_TRACT | Status: DC
  Administered 2018-07-01 – 2018-07-06 (×16): 0.5 mg via RESPIRATORY_TRACT
  Filled 2018-07-01 (×18): qty 2.5

## 2018-07-01 MED ORDER — MAGNESIUM SULFATE 2 GM/50ML IV SOLN
2.0000 g | Freq: Once | INTRAVENOUS | Status: AC
  Administered 2018-07-01: 2 g via INTRAVENOUS
  Filled 2018-07-01: qty 50

## 2018-07-01 MED ORDER — POTASSIUM CHLORIDE 10 MEQ/100ML IV SOLN
10.0000 meq | INTRAVENOUS | Status: AC
  Administered 2018-07-01 (×4): 10 meq via INTRAVENOUS
  Filled 2018-07-01 (×4): qty 100

## 2018-07-01 NOTE — Consult Note (Signed)
Pharmacy Electrolyte Monitoring Consult:  Pharmacy consulted to assist in monitoring and replacing electrolytes in this 72 y.o. male admitted on 06/29/2018 with Urinary Retention  Patient admitted to ICU s/p PEG placement. Now on telemetry unit.  Pt with AFib  Labs:  Sodium (mmol/L)  Date Value  07/01/2018 136   Potassium (mmol/L)  Date Value  07/01/2018 3.2 (L)   Magnesium (mg/dL)  Date Value  96/12/5407 1.8   Phosphorus (mg/dL)  Date Value  81/19/1478 2.1 (L)   Calcium (mg/dL)  Date Value  29/56/2130 8.0 (L)   Albumin (g/dL)  Date Value  86/57/8469 2.4 (L)   Corrected Calcium: 9.58  Assessment/Plan: Electrolyte goals: Potassium ~4.0, Magnesium ~2.0 due to afib.  Patient continued on furosemide 40mg  per tube BID  Ordered potassium 10 mEq IV every 1 hour for 4 dose. Ordered Magnesium sulf 2 gm IV once.  Will order potassium for 1800 and BMP with magnesium with AM labs.  Pharmacy will continue to monitor and adjust per consult.    Orinda Kenner, PharmD 07/01/2018 8:18 AM

## 2018-07-01 NOTE — Progress Notes (Signed)
Pt bladder scan showed 209. Will cotninue to monitor.

## 2018-07-01 NOTE — Consult Note (Signed)
Pharmacy Electrolyte Monitoring Consult:  Pharmacy consulted to assist in monitoring and replacing electrolytes in this 72 y.o. male admitted on 05/26/2018 with Urinary Retention  Patient admitted to ICU s/p PEG placement. Now on telemetry unit.  Pt with AFib  Labs:  Sodium (mmol/L)  Date Value  07/01/2018 136   Potassium (mmol/L)  Date Value  07/01/2018 3.6   Magnesium (mg/dL)  Date Value  65/78/4696 1.8   Phosphorus (mg/dL)  Date Value  29/52/8413 2.1 (L)   Calcium (mg/dL)  Date Value  24/40/1027 8.0 (L)   Albumin (g/dL)  Date Value  25/36/6440 2.4 (L)   Corrected Calcium: 9.58  Assessment/Plan: Electrolyte goals: Potassium ~4.0, Magnesium ~2.0 due to afib.  Patient continued on furosemide 40mg  per tube BID Ordered potassium 10 mEq IV every 1 hour for 4 dose. Ordered Magnesium sulf 2 gm IV once. Will order potassium for 1800 and BMP with magnesium with AM labs.   10/18 K @ 1932= 3.6. No supplementation at this time.  Pharmacy will continue to monitor and adjust per consult.    Jamien Casanova A, PharmD 07/01/2018 8:44 PM

## 2018-07-01 NOTE — Clinical Social Work Note (Signed)
CSW continuing to follow patient's progress throughout discharge planning.  Plan to eventually go to a SNF.  Alexander Lara. Alexander Lara, MSW, Theresia Majors [phone]  07/01/2018 5:15 PM

## 2018-07-01 NOTE — Care Management Important Message (Signed)
Copy of signed IM left with patient in room.  

## 2018-07-01 NOTE — Consult Note (Signed)
Pharmacy Antibiotic Note  Alexander Lara is a 72 y.o. male admitted on 06/07/2018 with pneumonia.  Pharmacy has been consulted for Meropenem dosing. Admitted with on 9/25 for UTI and PNA . Patient underwent EGD on 10/8. Patient had hypoxia after procedure; O2 sats in the 60s and was transferred to ICU. Patient extubated 10/16.   Plan: Continue meropenem 1 g IV Q12h for aspiration PNA x 2 weeks (stop 10/20)  total per ICU rounds discussion. Patient is on day 10 of broad aspiration coverage (Zosyn was switched to Meropenem due to PCN allergy).   Height: 5\' 11"  (180.3 cm) Weight: 73 lb (33.1 kg) IBW/kg (Calculated) : 75.3  Temp (24hrs), Avg:98.5 F (36.9 C), Min:98.2 F (36.8 C), Max:98.8 F (37.1 C)  Recent Labs  Lab 06/27/18 0519 06/28/18 0436 06/29/18 0435 06/30/18 0424 07/01/18 0634  WBC 7.5 11.8* 9.2 15.5* 17.1*  CREATININE 0.70 0.78 0.75 0.75 0.90    Estimated Creatinine Clearance: 34.2 mL/min (by C-G formula based on SCr of 0.9 mg/dL).    Allergies  Allergen Reactions  . Penicillins     Lip Swelling  . Zosyn [Piperacillin Sod-Tazobactam So]     Antimicrobials this admission: 10/11 Merrem >> 10/20 10/9 Zosyn >> 10/11 10/7 Augmentin >> 10/8 9/30 Unasyn >>10/8 9/28 Flagyl >> 09/30 9/26 Ceftriaxone >> 9/30   Microbiology results: 10/15 BCx: no growth 2 days  9/25 BCx: Proteus - pansensitive  9/25 UCx: Proteus mirabilis resistant to Macrobid  10/8 Sputum: rare candida albicans  10/8 MRSA PCR: (-)  Thank you for allowing pharmacy to be a part of this patient's care.  Orinda Kenner, PharmD 07/01/2018 1:13 PM

## 2018-07-01 NOTE — Plan of Care (Signed)
  Problem: Safety: Goal: Ability to remain free from injury will improve Outcome: Progressing   Problem: Skin Integrity: Goal: Risk for impaired skin integrity will decrease Outcome: Progressing   

## 2018-07-01 NOTE — Progress Notes (Signed)
RN noted continued moaning and grunting. RN ask pt if he was in pain and patient nods yes. RN ask if his head hurts. Pt nods no. Patient responds yes to his legs hurting. MD notified. Orders received. I will continue to assess.

## 2018-07-01 NOTE — Progress Notes (Signed)
SURGERY FOLLOW UP NOTE  Abdominal xray reviewed and contrast seen on splenic flexure. Agree with radiologist report that there is no bowel obstructive patter. My recommendation is to restart the diet and advance slowly as tolerated. This is a malnourished patient that has not received adequate enteral diet in a while so his bowel may work slower. Also recommend to avoid deep oral suctioning to avoid gag reflex and vomiting.   Carolan Shiver, MD
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
## 2018-07-01 NOTE — Progress Notes (Signed)
New coude' catheter was placed as attempts to irrigate previous catheter were unsuccessful with draining urine.  Post insertion of new catheter, catheter is draining urine.  Bladder scan post insertion reveals approximately 330 ml urine in bladder.  Called and spoke with Dr. Richardo Hanks of Urology.  Per Dr. Richardo Hanks, he feels that there is some degree of debris within the bladder, and that need to continue to irrigate foley.  He will assess pt at bedside 1st thing in the morning.   Harlon Ditty, AGACNP-BC [facility] Pager: [phone]

## 2018-07-01 NOTE — Consult Note (Signed)
Maciah Schweigert Jun 26, 1946 [id number]  Reason for consult: Non-draining foley  HPI: I was asked to see Mr. Becraft in consultation from Dr. Luberta Mutter for non-draining catheter.  The patient is an extremely comorbid and ill-appearing 72 year old male who is been in the ICU for multiple weeks with cachexia, UTI, bacteremia, PEG placement complicated by bowel injury requiring exploratory laparotomy.  His bladder is managed with a chronic Foley, and he was previously followed at the [facility].  Unable to obtain any history from the patient and he is non-communicative this morning.  CT was performed yesterday that showed bilateral hydronephrosis and a massively distended bladder greater than 500 cc despite catheter in appropriate position in the bladder.  This was irrigated, and then ultimately changed out by the nurses for a new 20 [place] coud catheter.  This morning it appears to be draining well.  I irrigated it at the bedside and there is no debris in urine is clear yellow.  Bladder scan is 0.  He is in a folded position in the bed with the bladder below the catheter, and I suspect this is a large reason it was not draining previously.  He does have a few small known bladder stones on CT scan.  PMH: Past Medical History:  Diagnosis Date  . Anxiety   . Emphysema of lung (HCC)   . Foley catheter in place   . Seizures (HCC)   . Subdural hematoma (HCC)   . Urinary retention     Surgical History: Past Surgical History:  Procedure Laterality Date  . LAPAROTOMY N/A July 16, 2018   Procedure: EXPLORATORY LAPAROTOMY, gastric repair, gastrostomy tube placement;  Surgeon: Carolan Shiver, MD;  Location: [facility] ORS;  Service: General;  Laterality: N/A;  . PEG PLACEMENT N/A 2018-07-16   Procedure: PERCUTANEOUS ENDOSCOPIC GASTROSTOMY (PEG) PLACEMENT;  Surgeon: Pasty Spillers, MD;  Location: [facility] ENDOSCOPY;  Service: Endoscopy;  Laterality: N/A;    Allergies:  Allergies  Allergen Reactions  .  Penicillins     Lip Swelling  . Zosyn [Piperacillin Sod-Tazobactam So]     Family History: History reviewed. No pertinent family history.  Social History:  reports that he has quit smoking. He has never used smokeless tobacco. He reports that he does not drink alcohol or use drugs.   Physical Exam: BP 140/81 (BP Location: Right Arm)   Pulse 66   Temp 98.8 F (37.1 C) (Oral)   Resp 18   Ht 5\' 11"  (1.803 m)   Wt 33.1 kg   SpO2 96%   BMI 10.18 kg/m    Constitutional: Cachectic, ill-appearing, nonverbal GI: Abdomen is soft, nontender, nondistended, no abdominal masses GU: No CVA tenderness, phallus without lesions, urine clear yellow per Foley Lymph: No cervical or inguinal lymphadenopathy. Skin: No rashes, bruises or suspicious lesions. Neurologic: Grossly intact, no focal deficits, moving all 4 extremities. Psychiatric: Non-verbal  Pertinent Imaging: I have personally reviewed the CT abdomen pelvis with contrast from 06/30/2018: Bilateral hydronephrosis secondary to a massively distended bladder, greater than 500 cc.  Catheter in appropriate position.  Assessment & Plan:   In summary, Mr. Penna is an extremely comorbid and ill-appearing 72 year old male with a bladder managed by chronic Foley, who I am asked to see regarding nondraining catheter demonstrated on CT scan.  Catheter was  replaced by nursing and is now draining clear yellow urine.  I irrigated the catheter at the bedside today and there is no significant debris, and no purulence.  Urine is clear yellow and his  bladder scan is 0.  I suspect part of the problem may be his positioning in bed with the catheter located above the bladder.  Renal function is normal with a creatinine of 0.9  -Maintain Foley, exchange every 4 weeks -Okay to irrigate as needed with normal saline if not draining -No role for surgical intervention regarding bladder stones in his extremely frail-appearing state -Urology will sign off, call if  questions  Sondra Come, MD  [facility] [address] [place], [place] [address] [phone]

## 2018-07-01 NOTE — Progress Notes (Signed)
SURGICAL PROGRESS NOTE     Interval History: Patient seen and examined. Found in no distress at bed, alert, not talkative. No family at bedside for interval history. Nurse did not pick up phone to answer question about today status.    Vital signs in last 24 hours: [min-max] current  Temp:  [98.2 F (36.8 C)-98.8 F (37.1 C)] 98.8 F (37.1 C) (10/18 0746) Pulse Rate:  [49-105] 66 (10/18 0746) Resp:  [12-21] 18 (10/18 0746) BP: (103-161)/(72-116) 140/81 (10/18 0746) SpO2:  [92 %-100 %] 93 % (10/18 1318) Weight:  [33.1 kg] 33.1 kg (10/18 0455)     Height: 5\' 11"  (180.3 cm) Weight: 33.1 kg BMI (Calculated): 10.19    Physical Exam:  Constitutional: alert, no distress Gastrointestinal: soft, non-tender, and non-distended. Gastrostomy tube in place.   Labs:  CBC Latest Ref Rng & Units 07/01/2018 06/30/2018 06/29/2018  WBC 4.0 - 10.5 K/uL 17.1(H) 15.5(H) 9.2  Hemoglobin 13.0 - 17.0 g/dL 10.3(L) 11.4(L) 8.9(L)  Hematocrit 39.0 - 52.0 % 30.9(L) 36.3(L) 27.5(L)  Platelets 150 - 400 K/uL 147(L) 176 124(L)   CMP Latest Ref Rng & Units 07/01/2018 06/30/2018 06/29/2018  Glucose 70 - 99 mg/dL 83 161(W) -  BUN 8 - 23 mg/dL 96(E) 45(W) -  Creatinine 0.61 - 1.24 mg/dL 0.98 1.19 -  Sodium 147 - 145 mmol/L 136 139 -  Potassium 3.5 - 5.1 mmol/L 3.2(L) 3.2(L) 3.6  Chloride 98 - 111 mmol/L 96(L) 95(L) -  CO2 22 - 32 mmol/L 29 33(H) -  Calcium 8.9 - 10.3 mg/dL 8.0(L) 8.3(L) -  Total Protein 6.5 - 8.1 g/dL - 6.6 -  Total Bilirubin 0.3 - 1.2 mg/dL - 0.5 -  Alkaline Phos 38 - 126 U/L - 94 -  AST 15 - 41 U/L - 22 -  ALT 0 - 44 U/L - 53(H) -    Imaging studies:  CLINICAL DATA:  72 year old male with Proteus UTI, bacteremia and sepsis from chronic indwelling Foley.  Acute hypoxic respiratory failure necessitating intubation.  Bowel perforation status post exploratory laparotomy and repair of gastric perforation on 06/22/2018.  EXAM: CT ABDOMEN AND PELVIS WITH  CONTRAST  TECHNIQUE: Multidetector CT imaging of the abdomen and pelvis was performed using the standard protocol following bolus administration of intravenous contrast.  CONTRAST:  65mL OMNIPAQUE IOHEXOL 300 MG/ML  SOLN  COMPARISON:  CT Abdomen and Pelvis 06/13/2018.  FINDINGS: Lower chest: Lower lobe bronchiectasis appears progressed since September along with widespread peribronchial nodularity at both lung bases. Superimposed small layering left pleural effusion and atelectasis versus early consolidation in the posterior basal segment of the left lower lobe.  No pericardial effusion.  Hepatobiliary: The gallbladder is contracted. The gallbladder wall is mildly enhancing. The liver appears normal. Mild periportal edema or intrahepatic bile duct enlargement has regressed/resolved since September.  Pancreas: Negative.  Spleen: Negative.  Adrenals/Urinary Tract: Negative adrenal glands. Bilateral renal enhancement and contrast excretion is symmetric and within normal limits.  However, despite the presence of a catheter within the bladder the urinary bladder is distended with an estimated volume of 539 milliliters. There are multiple layering stones within the bladder as well as a small volume of non dependent gas (series 2, image 62. There is mild bilateral hydronephrosis and proximal hydroureter.  Stomach/Bowel: Decompressed large bowel from the transverse colon to the rectum. There may be a small volume of fluid within those segments of bowel. The cecum is mildly to moderately distended with gas and fluid. There appears to be abrupt transition  of the large bowel caliber at the hepatic flexure on series 2, image 33. Oral contrast has reached the distal small bowel but not yet the terminal ileum. There are no dilated small bowel loops. There is a percutaneous gastrostomy. The stomach is mildly distended with contrast and otherwise unremarkable. The duodenum  appears normal.  No abdominal free air.  No definite free fluid.  Vascular/Lymphatic: Aortoiliac calcified atherosclerosis. Major arterial structures in the abdomen and pelvis are patent although right common iliac artery stenosis is identified on series 2, image 45. The portal venous system appears to remain patent.  No lymphadenopathy identified.  Reproductive: Urinary catheter in place.  Otherwise negative.  Other: No pelvic free fluid. Postoperative changes to the ventral abdominal wall with no adverse features.  Musculoskeletal: T12 and L2 compression fractures are stable since September. No acute osseous abnormality identified.  IMPRESSION: 1. Abnormal lung bases with progressed Bronchiectasis and widespread peribronchial nodularity since the CT on 06/13/2018. There is a small layering left pleural effusion, with mild left lower lobe consolidation or atelectasis. This might reflect sequelae of bilateral aspiration, although hematogenously disseminated infection is also possible.  2. Distended urinary bladder despite the presence of a catheter. Estimated bladder volume 539 mL and mild associated bilateral hydronephrosis and proximal hydroureter. Multiple small stones are layering within the bladder.  3. Air and fluid distended cecum and ascending colon with abrupt transition to decompressed colon at the hepatic flexure. However, there is no small bowel dilatation to strongly indicate this is a mechanical bowel obstruction. Perhaps this is instead a large bowel ileus.  4. No free air or free fluid identified.  5. Aortic Atherosclerosis (ICD10-I70.0). Right common iliac artery stenosis.   Electronically Signed   By: Odessa Fleming M.D.   On: 06/30/2018 16:50  Assessment/Plan:  72 y.o. male with severe malnutrition 10 Days Post-Op s/p gastrorrhaphy and gastrostomy tube placement due to iatrogenic gastric laceration. Patient with multiple acute and chronic  medical condition including malnutrition, pneumonia, UTI, stroke, among others. Patient tolerated surgery well and was tolerating tube feeding well. Yesterday patient had vomit and Ct scan was done with a reporting suspected large bowel ileus. I was called for re evaluation. There was no family member, and no nurse for interval history of patient's status. Currently bowel movement status is unknwon since there is non reported on the chart.  Dietitian refers vomits were caused by suctioning and not by ileus. My recommendation is to follow up with abdominal xray and if contrast go through transverse colon, patient may continue diet. If patient is having bowel movement is another good sign to restart diet. Will be important to chart bowel movement episodes.   Gae Gallop, MD

## 2018-07-01 NOTE — Consult Note (Signed)
NAME: Alexander Lara  DOB: 06-Nov-1945  MRN: [id number]  Date/Time: 07/01/2018 7:36 PM  Alexander Lara Subjective:  REASON FOR CONSULT: Ongoing fever peritonitis No history available from patient.  Chart reviewed.  ? 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  On admission he was nonverbal and cachectic and his vitals were pulse of 105, temperature of 98.7, blood pressure of 138/76.  He had decreased breath sounds in the right lower field and left lower feet.  He he had a WBC of 11.5 and hemoglobin of 9.9 on admission.  He had a blood glucose of 108 and creatinine of 1 on admission he was admitted as recurrent UTI and was started on ceftriaxone.  Blood cultures and urine cultures were sent before starting antibiotic.  The blood culture came back positive for Proteus and Clostridium species.  The urine culture had Proteus.  He was switched to Unasyn on 06/13/2018 his hospital stay has been complicated by the following.  Because of failure to thrive they attempted to place PEG on 41/02/6062 which was complicated by the blade piercing through the stomach and the procedure was abandoned.  Following which she developed respiratory distress and also his had to be intubated.  The x-ray abdomen showed air.  He was taken for urgent laparotomy on 06/22/2018.  Gastric perforation was noted which was repaired and the PEG was placed.  There was also a hemoglobin drop up to 5.6 and he was given PRBC.  He was in the ICU.  He got extubated on 06/23/2018.  And tube feeds started on 06/24/2018 .on 06/26/2018 he had to be reintubated for respiratory distress and suspicion for aspiration.  He was started on meropenem on 06/26/2018  He also  was diagnosed with DVT of the left upper extremity and nonocclusive subclavian DVT associated with PICC line by ultrasound. I am asked to see the patient as he has had low-grade fever and some leukocytosis in spite of being on antibiotics.  Antibiotic regimen in the hospital as follows Ceftriaxone 05/21/2018 until 06/13/2018 Flagyl 928 10/23/1928 Unasyn 9 3210 8 Zosyn 10 8-10 11 Vancomycin '10 8 1 '$ dose Meropenem since 06/24/2018  Fever curve in the hospital For the past 2 weeks   Past Medical History:  Diagnosis Date  . Anxiety   . Emphysema of lung ([place])   . Foley catheter in place   . Seizures ([place])   . Subdural hematoma ([place])   . Urinary retention     Past Surgical History:  Procedure Laterality Date  . LAPAROTOMY N/A 06/18/2018   Procedure: EXPLORATORY LAPAROTOMY, gastric repair, gastrostomy tube placement;  Surgeon: Herbert Pun, MD;  Location: [facility] ORS;  Service: General;  Laterality: N/A;  . PEG PLACEMENT N/A 06/14/2018   Procedure: PERCUTANEOUS ENDOSCOPIC GASTROSTOMY (PEG) PLACEMENT;  Surgeon: Virgel Manifold, MD;  Location: [facility] ENDOSCOPY;  Service: Endoscopy;  Laterality: N/A;     History reviewed. No pertinent family history. Allergies  Allergen Reactions  . Penicillins     Lip Swelling  . Zosyn [Piperacillin Sod-Tazobactam So]    ? Current Facility-Administered Medications  Medication Dose Route Frequency Provider Last Rate Last Dose  . 0.9 %  sodium chloride infusion   Intravenous PRN Loletha Grayer, MD 5 mL/hr at 06/26/18 0930 250  mL at 06/26/18 0930  . acetaminophen (TYLENOL) tablet 650 mg  650 mg Per Tube Q6H PRN Wilhelmina Mcardle, MD   650 mg at 06/29/18 1813  . albuterol (PROVENTIL) (2.5 MG/3ML) 0.083% nebulizer solution 2.5 mg  2.5 mg Nebulization Q4H PRN Hillary Bow, MD   2.5 mg at 06/24/18 0059  . amiodarone (NEXTERONE PREMIX) 360-4.14 MG/200ML-% (1.8 mg/mL) IV infusion  30 mg/hr Intravenous Continuous Darel Hong D, NP 16.67 mL/hr at  07/01/18 1739 30 mg/hr at 07/01/18 1739  . chlorhexidine gluconate (MEDLINE KIT) (PERIDEX) 0.12 % solution 15 mL  15 mL Mouth Rinse BID Awilda Bill, NP   15 mL at 07/01/18 0859  . famotidine (PEPCID) tablet 20 mg  20 mg Per Tube BID Charlett Nose, RPH   20 mg at 07/01/18 1000  . fentaNYL (SUBLIMAZE) injection 25-50 mcg  25-50 mcg Intravenous Q2H PRN Awilda Bill, NP   50 mcg at 06/30/18 0807  . furosemide (LASIX) tablet 20 mg  20 mg Per Tube BID Lahoma Rocker, MD   20 mg at 07/01/18 1801  . heparin injection 5,000 Units  5,000 Units Subcutaneous Q8H Lahoma Rocker, MD   5,000 Units at 07/01/18 1343  . ipratropium (ATROVENT) nebulizer solution 0.5 mg  0.5 mg Nebulization TID Epifanio Lesches, MD      . lactated ringers infusion   Intravenous Continuous Bradly Bienenstock, NP 30 mL/hr at 07/01/18 1739    . levETIRAcetam (KEPPRA) 100 MG/ML solution 500 mg  500 mg Per Tube BID Rosine Door, MD   500 mg at 07/01/18 1310  . MEDLINE mouth rinse  15 mL Mouth Rinse 10 times per day Awilda Bill, NP   15 mL at 07/01/18 0510  . meropenem (MERREM) 1 g in sodium chloride 0.9 % 100 mL IVPB  1 g Intravenous Q12H Paticia Stack, [place] at 07/01/18 1551  . metoprolol tartrate (LOPRESSOR) injection 2.5-5 mg  2.5-5 mg Intravenous Q3H PRN Wilhelmina Mcardle, MD   5 mg at 06/30/18 0254  . metoprolol tartrate (LOPRESSOR) tablet 12.5 mg  12.5 mg Per Tube BID Tyler Pita, MD   12.5 mg at 07/01/18 0959  . multivitamin liquid 15 mL  15 mL Per Tube Daily Rosine Door, MD   15 mL at 07/01/18 1310  . ondansetron (ZOFRAN) injection 4 mg  4 mg Intravenous Q6H PRN Arta Silence, MD   4 mg at 06/29/18 2023  . oxyCODONE-acetaminophen (PERCOCET/ROXICET) 5-325 MG per tablet 1 tablet  1 tablet Oral Q8H PRN Epifanio Lesches, MD   1 tablet at 07/01/18 1342  . polyvinyl alcohol (LIQUIFILM TEARS) 1.4 % ophthalmic solution 1 drop  1 drop Both Eyes QID PRN Asencion Gowda, NP   1 drop at 06/18/18  0425  . predniSONE (DELTASONE) tablet 20 mg  20 mg Per Tube Q breakfast Lahoma Rocker, MD   20 mg at 07/01/18 0851  . sodium chloride 0.9 % injection 60 mL  60 mL Per Tube PRN Conforti, John, DO   60 mL at 06/22/18 1130  . sodium chloride flush (NS) 0.9 % injection 10-40 mL  10-40 mL Intracatheter Q12H Wieting, Richard, MD   10 mL at 07/01/18 1000  . sodium chloride flush (NS) 0.9 % injection 10-40 mL  10-40 mL Intracatheter PRN Loletha Grayer, MD   10 mL at 06/24/18 1237  . vitamin C (ASCORBIC ACID) tablet 250 mg  250 mg Per Tube BID Rosine Door, MD   250  mg at 07/01/18 1011     Abtx:  Anti-infectives (From admission, onward)   Start     Dose/Rate Route Frequency Ordered Stop   06/24/18 1400  meropenem (MERREM) 1 g in sodium chloride 0.9 % 100 mL IVPB     1 g 200 mL/hr over 30 Minutes Intravenous Every 12 hours 06/24/18 1310     06/22/18 0600  piperacillin-tazobactam (ZOSYN) IVPB 3.375 g  Status:  Discontinued     3.375 g 12.5 mL/hr over 240 Minutes Intravenous Every 8 hours 07/05/2018 2326 06/24/18 0923   07/03/2018 2030  Ampicillin-Sulbactam (UNASYN) 3 g in sodium chloride 0.9 % 100 mL IVPB  Status:  Discontinued     3 g 200 mL/hr over 30 Minutes Intravenous Every 6 hours 07/03/2018 1927 06/16/2018 2302   07/06/2018 1815  vancomycin (VANCOCIN) IVPB 1000 mg/200 mL premix  Status:  Discontinued     1,000 mg 200 mL/hr over 60 Minutes Intravenous  Once 06/22/2018 1814 06/20/2018 1854   06/14/2018 1450  vancomycin (VANCOCIN) 1-5 GM/200ML-% IVPB    Note to Pharmacy:  Carlean Purl   : cabinet override      07/01/2018 1450 06/22/18 0314   06/20/18 1500  amoxicillin-clavulanate (AUGMENTIN) 400-57 MG/5ML suspension 500 mg  Status:  Discontinued     500 mg Per Tube Every 12 hours 06/20/18 1351 06/26/2018 1854   06/20/18 1400  amoxicillin-clavulanate (AUGMENTIN) 250-62.5 MG/5ML suspension 500 mg  Status:  Discontinued     500 mg Per Tube Every 12 hours 06/20/18 1351 06/20/18 1351   06/13/18 1400   Ampicillin-Sulbactam (UNASYN) 3 g in sodium chloride 0.9 % 100 mL IVPB  Status:  Discontinued     3 g 200 mL/hr over 30 Minutes Intravenous Every 6 hours 06/13/18 1325 06/20/18 1351   06/11/18 1600  metroNIDAZOLE (FLAGYL) IVPB 500 mg  Status:  Discontinued     500 mg 100 mL/hr over 60 Minutes Intravenous Every 8 hours 06/11/18 1500 06/13/18 1305   06/10/18 2200  cefTRIAXone (ROCEPHIN) 2 g in sodium chloride 0.9 % 100 mL IVPB  Status:  Discontinued     2 g 200 mL/hr over 30 Minutes Intravenous Every 24 hours 06/10/18 1501 06/13/18 1305   06/09/18 2300  cefTRIAXone (ROCEPHIN) 1 g in sodium chloride 0.9 % 100 mL IVPB  Status:  Discontinued     1 g 200 mL/hr over 30 Minutes Intravenous Every 24 hours 06/09/18 0034 06/10/18 1119   06/06/2018 2300  cefTRIAXone (ROCEPHIN) 1 g in sodium chloride 0.9 % 100 mL IVPB     1 g 200 mL/hr over 30 Minutes Intravenous  Once 06/11/2018 2257 06/09/18 0051      REVIEW OF SYSTEMS:  Not available  Objective:  VITALS:  BP (!) 168/80 (BP Location: Right Arm)   Pulse 83   Temp 98.1 F (36.7 C) (Oral)   Resp 18   Ht _0  (1.803 m)   Wt 33.1 kg   SpO2 99%   BMI 10.18 kg/m  PHYSICAL EXAM:  General: Emancipated, drowsy, minimal response, opens eyes on calling his name Head: Normocephalic, without obvious abnormality, atraumatic. Eyes: Conjunctivae clear, anicteric sclerae. Pupils are equal ENT unable to examine  Neck: Supple,  Back: Did not examine Lungs: Clear to auscultation bilaterally. No Wheezing or Rhonchi. No rales. Heart: s1s2 Abdomen: Soft,PEG, surgical dressing . No masses Extremities: bruises arms Skin: bruises Lymph: Cervical, supraclavicular normal. Neurologic: did not examine in detail foley Pertinent Labs CBC Latest Ref Rng & Units 07/01/2018 06/30/2018  06/29/2018  WBC 4.0 - 10.5 K/uL 17.1(H) 15.5(H) 9.2  Hemoglobin 13.0 - 17.0 g/dL 10.3(L) 11.4(L) 8.9(L)  Hematocrit 39.0 - 52.0 % 30.9(L) 36.3(L) 27.5(L)  Platelets 150 - 400 K/uL  147(L) 176 124(L)   CMP Latest Ref Rng & Units 07/01/2018 06/30/2018 06/29/2018  Glucose 70 - 99 mg/dL 83 126(H) -  BUN 8 - 23 mg/dL 31(H) 33(H) -  Creatinine 0.61 - 1.24 mg/dL 0.90 0.75 -  Sodium 135 - 145 mmol/L 136 139 -  Potassium 3.5 - 5.1 mmol/L 3.2(L) 3.2(L) 3.6  Chloride 98 - 111 mmol/L 96(L) 95(L) -  CO2 22 - 32 mmol/L 29 33(H) -  Calcium 8.9 - 10.3 mg/dL 8.0(L) 8.3(L) -  Total Protein 6.5 - 8.1 g/dL - 6.6 -  Total Bilirubin 0.3 - 1.2 mg/dL - 0.5 -  Alkaline Phos 38 - 126 U/L - 94 -  AST 15 - 41 U/L - 22 -  ALT 0 - 44 U/L - 53(H) -    IMAGING RESULTS: ? Impression/Recommendation ?72 y.o.male with a history of urinary retention status post chronic indwelling urinary catheter, chronic subdural hematoma, history of seizures, severe protein calorie malnutrition with failure to thrive, recent hospitalization for  UTI between 05/14/2018 and 05/18/2018, presented from [facility] with dark cloudy urine on 06/05/2018.  In Hospital since 05/19/2018  Low grade fever and leucocytosis- CT scan from showed  Bladder distension, hydronephrosis (inspite of foley) and bladder stones. Foley catheter changed. Wait to see if leucocytosis resolves. May be able to DC meropenem soon  Clostridium species /proteus bacteremia on admission- treated. Risk of colon cancer with clostridium.  CAUTI with proteus- treated  Gastric perforation due to procedure- s/p surgical repair  PEG  Emaciation/failure to thrive  Bladder stones  B/l avascular necrosis of femoral head.    Discussed the management with his nurse

## 2018-07-02 ENCOUNTER — Inpatient Hospital Stay: Payer: Medicare Other

## 2018-07-02 LAB — BASIC METABOLIC PANEL
Anion gap: 11 (ref 5–15)
BUN: 26 mg/dL — ABNORMAL HIGH (ref 8–23)
CHLORIDE: 95 mmol/L — AB (ref 98–111)
CO2: 27 mmol/L (ref 22–32)
CREATININE: 0.87 mg/dL (ref 0.61–1.24)
Calcium: 7.8 mg/dL — ABNORMAL LOW (ref 8.9–10.3)
GFR calc non Af Amer: >60 mL/min (ref >60)
Glucose, Bld: 79 mg/dL (ref 70–99)
POTASSIUM: 3 mmol/L — AB (ref 3.5–5.1)
SODIUM: 133 mmol/L — AB (ref 135–145)

## 2018-07-02 LAB — HIV ANTIBODY (ROUTINE TESTING W REFLEX): HIV SCREEN 4TH GENERATION: NONREACTIVE

## 2018-07-02 LAB — BLOOD GAS, ARTERIAL
ACID-BASE DEFICIT: 1.9 mmol/L (ref 0.0–2.0)
BICARBONATE: 24.7 mmol/L (ref 20.0–28.0)
FIO2: 1
O2 SAT: 98.3 %
PATIENT TEMPERATURE: 37
PH ART: 7.31 — AB (ref 7.350–7.450)
pCO2 arterial: 49 mmHg — ABNORMAL HIGH (ref 32.0–48.0)
pO2, Arterial: 120 mmHg — ABNORMAL HIGH (ref 83.0–108.0)

## 2018-07-02 LAB — MAGNESIUM: Magnesium: 2.1 mg/dL (ref 1.7–2.4)

## 2018-07-02 LAB — GLUCOSE, CAPILLARY: Glucose-Capillary: 151 mg/dL — ABNORMAL HIGH (ref 70–99)

## 2018-07-02 LAB — CBC
HCT: 30.6 % — ABNORMAL LOW (ref 39.0–52.0)
HEMOGLOBIN: 10 g/dL — AB (ref 13.0–17.0)
MCH: 26.9 pg (ref 26.0–34.0)
MCHC: 32.7 g/dL (ref 30.0–36.0)
MCV: 82.3 fL (ref 80.0–100.0)
Platelets: 163 10*3/uL (ref 150–400)
RBC: 3.72 MIL/uL — AB (ref 4.22–5.81)
RDW: 19.6 % — ABNORMAL HIGH (ref 11.5–15.5)
WBC: 18.6 10*3/uL — AB (ref 4.0–10.5)
nRBC: 0 % (ref 0.0–0.2)

## 2018-07-02 LAB — PHOSPHORUS: PHOSPHORUS: 2.6 mg/dL (ref 2.5–4.6)

## 2018-07-02 LAB — POTASSIUM: POTASSIUM: 5.2 mmol/L — AB (ref 3.5–5.1)

## 2018-07-02 MED ORDER — POTASSIUM CHLORIDE 10 MEQ/100ML IV SOLN
10.0000 meq | INTRAVENOUS | Status: DC
  Administered 2018-07-02 (×2): 10 meq via INTRAVENOUS
  Filled 2018-07-02 (×4): qty 100

## 2018-07-02 MED ORDER — POTASSIUM CHLORIDE 20 MEQ/15ML (10%) PO SOLN
40.0000 meq | ORAL | Status: AC
  Administered 2018-07-02 (×2): 40 meq via ORAL
  Filled 2018-07-02 (×2): qty 30

## 2018-07-02 MED ORDER — FUROSEMIDE 10 MG/ML IJ SOLN
20.0000 mg | Freq: Once | INTRAMUSCULAR | Status: AC
  Administered 2018-07-02: 20 mg via INTRAVENOUS
  Filled 2018-07-02: qty 2

## 2018-07-02 MED ORDER — LEVALBUTEROL HCL 0.63 MG/3ML IN NEBU
0.6300 mg | INHALATION_SOLUTION | Freq: Once | RESPIRATORY_TRACT | Status: AC
  Administered 2018-07-03: 0.63 mg via RESPIRATORY_TRACT
  Filled 2018-07-02: qty 3

## 2018-07-02 MED ORDER — ORAL CARE MOUTH RINSE
15.0000 mL | Freq: Two times a day (BID) | OROMUCOSAL | Status: DC
  Administered 2018-07-02 – 2018-07-06 (×10): 15 mL via OROMUCOSAL

## 2018-07-02 MED ORDER — CHLORHEXIDINE GLUCONATE 0.12 % MT SOLN
15.0000 mL | Freq: Two times a day (BID) | OROMUCOSAL | Status: DC
  Administered 2018-07-03 – 2018-07-06 (×6): 15 mL via OROMUCOSAL
  Filled 2018-07-02 (×10): qty 15

## 2018-07-02 MED ORDER — VITAL 1.5 CAL PO LIQD
1000.0000 mL | ORAL | Status: DC
  Administered 2018-07-02: 1000 mL

## 2018-07-02 NOTE — Progress Notes (Addendum)
At approximately 2135, pt's fiancee called this RN to state he was having trouble breathing.  Pt having increased work of breathing, wheezing, and is diminished. Respiratory called. Stated he had wheezes and crackles at the lung bases.  On-call MD paged.    Dr Anne Hahn called.Ordering CXR, ABG; and EKG.

## 2018-07-02 NOTE — Progress Notes (Signed)
Brief Nutrition Note  Consult received for enteral/tube feeding initiation and management. MD noted in consult for TF to be started at Low rate.   Will initiate Vital 1.5 at 20 cc/hr and RD to f/u tomorrow.   Admitting Dx: Lower urinary tract infectious disease [N39.0]  Labs: Recent Labs  Lab 06/27/18 0519  06/29/18 0435  06/30/18 0424 07/01/18 0634 07/01/18 1932 07/02/18 0524  NA 135   < > 138  --  139 136  --  133*  K 3.8   < > 3.6   < > 3.2* 3.2* 3.6 3.0*  CL 99   < > 100  --  95* 96*  --  95*  CO2 25   < > 31  --  33* 29  --  27  BUN 18   < > 25*  --  33* 31*  --  26*  CREATININE 0.70   < > 0.75  --  0.75 0.90  --  0.87  CALCIUM 7.4*   < > 7.7*  --  8.3* 8.0*  --  7.8*  MG 1.6*   < > 2.1   < > 2.0 1.8  --  2.1  PHOS 3.0  --  2.1*  --   --   --   --  2.6  GLUCOSE 145*   < > 119*  --  126* 83  --  79   < > = values in this interval not displayed.   Christophe Louis RD, LDN, CNSC Clinical Nutrition Available Tues-Sat via Pager: [phone] 07/02/2018 12:19 PM

## 2018-07-02 NOTE — Progress Notes (Signed)
RT called to patient bedside due to increased work of breathing. Patient experiencing difficulty breathing, nasal flaring, and accessory muscle use. Diminished breath sounds. SAT at 96% on 5L [place]. Heart rate approximately 120-130bpm. RN at bedside. On call MD paged. Patient placed on NRB to assist with breathing. ABG drawn per MD order.

## 2018-07-02 NOTE — Consult Note (Signed)
Pharmacy Electrolyte Monitoring Consult:  Pharmacy consulted to assist in monitoring and replacing electrolytes in this 72 y.o. male admitted on 05/29/2018 with Urinary Retention  Patient admitted to ICU s/p PEG placement. Now on telemetry unit.  Pt with AFib  Labs:  Sodium (mmol/L)  Date Value  07/02/2018 133 (L)   Potassium (mmol/L)  Date Value  07/02/2018 5.2 (H)   Magnesium (mg/dL)  Date Value  16/06/9603 2.1   Phosphorus (mg/dL)  Date Value  54/05/8118 2.6   Calcium (mg/dL)  Date Value  14/78/2956 7.8 (L)   Albumin (g/dL)  Date Value  21/30/8657 2.4 (L)   Corrected Calcium: 9.58  Assessment/Plan: Electrolyte goals: Potassium ~4.0, Magnesium ~2.0 due to afib.  Patient continued on furosemide 20mg  per tube BID Patient received KCl IV x 2 doses and  KCL PO x 2 doses.   K=5.2, no additional supplementation.  Recheck electrolytes with am labs.  Pharmacy will continue to monitor and adjust per consult.   Clovia Cuff, PharmD, BCPS 07/02/2018 7:52 PM

## 2018-07-02 NOTE — Consult Note (Addendum)
Pharmacy Electrolyte Monitoring Consult:  Pharmacy consulted to assist in monitoring and replacing electrolytes in this 72 y.o. male admitted on 2018-06-23 with Urinary Retention  Patient admitted to ICU s/p PEG placement. Now on telemetry unit.  Pt with AFib  Labs:  Sodium (mmol/L)  Date Value  07/02/2018 133 (L)   Potassium (mmol/L)  Date Value  07/02/2018 3.0 (L)   Magnesium (mg/dL)  Date Value  16/06/9603 2.1   Phosphorus (mg/dL)  Date Value  54/05/8118 2.6   Calcium (mg/dL)  Date Value  14/78/2956 7.8 (L)   Albumin (g/dL)  Date Value  21/30/8657 2.4 (L)   Corrected Calcium: 9.58  Assessment/Plan: Electrolyte goals: Potassium ~4.0, Magnesium ~2.0 due to afib.  Patient continued on furosemide 20mg  per tube BID Ordered KCL PO x 2 doses.    Recheck electrolytes tonight at 18:00 and with am labs.  Pharmacy will continue to monitor and adjust per consult.    Stormy Card, [facility] 07/02/2018 11:05 AM
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
## 2018-07-03 LAB — BASIC METABOLIC PANEL
ANION GAP: 14 (ref 5–15)
BUN: 32 mg/dL — ABNORMAL HIGH (ref 8–23)
CO2: 24 mmol/L (ref 22–32)
Calcium: 8.1 mg/dL — ABNORMAL LOW (ref 8.9–10.3)
Chloride: 98 mmol/L (ref 98–111)
Creatinine, Ser: 1.07 mg/dL (ref 0.61–1.24)
GFR calc Af Amer: >60 mL/min (ref >60)
GFR calc non Af Amer: >60 mL/min (ref >60)
Glucose, Bld: 131 mg/dL — ABNORMAL HIGH (ref 70–99)
POTASSIUM: 4.5 mmol/L (ref 3.5–5.1)
SODIUM: 136 mmol/L (ref 135–145)

## 2018-07-03 LAB — CULTURE, BLOOD (ROUTINE X 2): Culture: NO GROWTH

## 2018-07-03 LAB — CBC
HCT: 32.9 % — ABNORMAL LOW (ref 39.0–52.0)
HEMOGLOBIN: 10.8 g/dL — AB (ref 13.0–17.0)
MCH: 26.9 pg (ref 26.0–34.0)
MCHC: 32.8 g/dL (ref 30.0–36.0)
MCV: 82 fL (ref 80.0–100.0)
Platelets: 182 10*3/uL (ref 150–400)
RBC: 4.01 MIL/uL — AB (ref 4.22–5.81)
RDW: 19.8 % — ABNORMAL HIGH (ref 11.5–15.5)
WBC: 27.2 10*3/uL — AB (ref 4.0–10.5)
nRBC: 0 % (ref 0.0–0.2)

## 2018-07-03 LAB — MAGNESIUM: MAGNESIUM: 2.2 mg/dL (ref 1.7–2.4)

## 2018-07-03 LAB — GLUCOSE, CAPILLARY: GLUCOSE-CAPILLARY: 116 mg/dL — AB (ref 70–99)

## 2018-07-03 MED ORDER — VITAL 1.5 CAL PO LIQD
1000.0000 mL | ORAL | Status: DC
  Administered 2018-07-03: 1000 mL

## 2018-07-03 MED ORDER — INSULIN ASPART 100 UNIT/ML ~~LOC~~ SOLN
0.0000 [IU] | Freq: Three times a day (TID) | SUBCUTANEOUS | Status: DC
  Administered 2018-07-04 – 2018-07-06 (×6): 1 [IU] via SUBCUTANEOUS
  Administered 2018-07-06: 2 [IU] via SUBCUTANEOUS
  Filled 2018-07-03 (×7): qty 1

## 2018-07-03 MED ORDER — FUROSEMIDE 10 MG/ML IJ SOLN
20.0000 mg | Freq: Once | INTRAMUSCULAR | Status: AC
  Administered 2018-07-03: 20 mg via INTRAVENOUS
  Filled 2018-07-03: qty 2
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
## 2018-07-03 NOTE — Progress Notes (Signed)
Nutrition Brief Follow-Up Note  Tube feeds were reinitiated at trickle rate of 20 mL/hr yesterday. Overnight patient experienced increased work of breathing, nasal flaring, accessory muscle use, diminished breath sounds.  Tube feeds were then held and were still on hold at time of RD assessment. After discussing nutrition plan of care with MD, plan is to restart tube feeds at 10 mL/hr today. Also discussed with patient's fiance at bedside. She is requesting tube feeds be restarted at trickle rate today.  Resume Vital 1.5 at 10 mL/hr. RD will continue to follow.  Helane Rima, MS, RD, LDN Office: [phone] Pager: [phone] After Hours/Weekend Pager: [phone]

## 2018-07-03 NOTE — Consult Note (Signed)
Pharmacy Electrolyte Monitoring Consult:  Pharmacy consulted to assist in monitoring and replacing electrolytes in this 72 y.o. male admitted on 05/22/2018 with Urinary Retention  Patient admitted to ICU s/p PEG placement. Now on telemetry unit.  Pt with AFib  Labs:  Sodium (mmol/L)  Date Value  07/03/2018 136   Potassium (mmol/L)  Date Value  07/03/2018 4.5   Magnesium (mg/dL)  Date Value  16/06/9603 2.2   Phosphorus (mg/dL)  Date Value  54/05/8118 2.6   Calcium (mg/dL)  Date Value  14/78/2956 8.1 (L)   Albumin (g/dL)  Date Value  21/30/8657 2.4 (L)   Corrected Calcium: 9.58  Assessment/Plan: Electrolyte goals: Potassium ~4.0, Magnesium ~2.0 due to afib.  Patient continued on furosemide 20mg  per tube BID  No supplementation is warranted at this time.   Recheck electrolytes with am labs.  Pharmacy will continue to monitor and adjust per consult.   Stormy Card, [facility] 07/03/2018 7:42 AM

## 2018-07-03 NOTE — Progress Notes (Addendum)
In speaking to fiancee, she relayed that the patient started having trouble swallowing until he was on Flomax for BPH.  She wants to know if we can find a better drug for prostate instead of him having a chronic foley that will not cause muscle weakness.

## 2018-07-03 NOTE — Progress Notes (Signed)
I was called by nurse because the family was asking for the surgical input.   Patient was seen resting with BiPAP, no distress. There was an event yesterday of shortness of breath. Chest xray shows a worsening in consolidation on right lung base. Unable to differentiate if it was due to new episode of aspiration or worsening pneumonia.  From surgical stand point, gastrostomy is in place and working properly. Fiance wanted my recommendation regarding diet management. I agree that this patient should start with a very low rate and do not increase as any normal patient. I also recommend to keep patient's head of bed as elevated as possible with very frequent position changes. I would recommend primary care team and dietician to consider TPN as an adjunct to the enteral diet until we see that this patient tolerates enteral diet well without aspiration.  I also recommend to have frequent conversation of primary care team with family member to discuss progression of patient condition and plan of care. I am sure that this conversation has happened but family still with a lot of questions that some reassurance and communication to be on the same page should be done.  Again, from surgery stand point, there is no further surgical management that I recommend, there is no bowel obstruction or surgical pathology at this moment. Will follow peripherally.

## 2018-07-04 ENCOUNTER — Inpatient Hospital Stay: Payer: Medicare Other

## 2018-07-04 LAB — CBC
HCT: 32.6 % — ABNORMAL LOW (ref 39.0–52.0)
Hemoglobin: 10.6 g/dL — ABNORMAL LOW (ref 13.0–17.0)
MCH: 27.1 pg (ref 26.0–34.0)
MCHC: 32.5 g/dL (ref 30.0–36.0)
MCV: 83.4 fL (ref 80.0–100.0)
NRBC: 0 % (ref 0.0–0.2)
PLATELETS: 176 10*3/uL (ref 150–400)
RBC: 3.91 MIL/uL — AB (ref 4.22–5.81)
RDW: 19.9 % — ABNORMAL HIGH (ref 11.5–15.5)
WBC: 23.3 10*3/uL — AB (ref 4.0–10.5)

## 2018-07-04 LAB — GLUCOSE, CAPILLARY
GLUCOSE-CAPILLARY: 107 mg/dL — AB (ref 70–99)
GLUCOSE-CAPILLARY: 108 mg/dL — AB (ref 70–99)
Glucose-Capillary: 134 mg/dL — ABNORMAL HIGH (ref 70–99)
Glucose-Capillary: 122 mg/dL — ABNORMAL HIGH (ref 70–99)
Glucose-Capillary: 104 mg/dL — ABNORMAL HIGH (ref 70–99)

## 2018-07-04 LAB — BASIC METABOLIC PANEL
ANION GAP: 15 (ref 5–15)
BUN: 29 mg/dL — ABNORMAL HIGH (ref 8–23)
CO2: 24 mmol/L (ref 22–32)
Calcium: 7.8 mg/dL — ABNORMAL LOW (ref 8.9–10.3)
Chloride: 99 mmol/L (ref 98–111)
Creatinine, Ser: 1.02 mg/dL (ref 0.61–1.24)
GLUCOSE: 100 mg/dL — AB (ref 70–99)
POTASSIUM: 3 mmol/L — AB (ref 3.5–5.1)
Sodium: 138 mmol/L (ref 135–145)

## 2018-07-04 LAB — MAGNESIUM: Magnesium: 2 mg/dL (ref 1.7–2.4)

## 2018-07-04 LAB — PHOSPHORUS: Phosphorus: 2.5 mg/dL (ref 2.5–4.6)

## 2018-07-04 LAB — MRSA PCR SCREENING: MRSA BY PCR: NEGATIVE

## 2018-07-04 LAB — POTASSIUM: POTASSIUM: 3.3 mmol/L — AB (ref 3.5–5.1)

## 2018-07-04 MED ORDER — AMIODARONE HCL 200 MG PO TABS
200.0000 mg | ORAL_TABLET | Freq: Every day | ORAL | Status: DC
  Administered 2018-07-04 – 2018-07-06 (×3): 200 mg via ORAL
  Filled 2018-07-04 (×3): qty 1

## 2018-07-04 MED ORDER — POTASSIUM CHLORIDE 20 MEQ PO PACK: 20.0000 meq | PACK | ORAL | Status: DC

## 2018-07-04 MED ORDER — FREE WATER
30.0000 mL | Status: DC
  Administered 2018-07-04 – 2018-07-07 (×15): 30 mL

## 2018-07-04 MED ORDER — VANCOMYCIN HCL 500 MG IV SOLR
500.0000 mg | INTRAVENOUS | Status: DC
  Administered 2018-07-04: 500 mg via INTRAVENOUS
  Filled 2018-07-04: qty 500

## 2018-07-04 MED ORDER — OSMOLITE 1.5 CAL PO LIQD
1000.0000 mL | ORAL | Status: DC
  Administered 2018-07-04 – 2018-07-06 (×3): 1000 mL

## 2018-07-04 MED ORDER — FREE WATER: 30.0000 mL | Freq: Three times a day (TID) | Status: DC

## 2018-07-04 MED ORDER — POTASSIUM CHLORIDE 20 MEQ/15ML (10%) PO SOLN
40.0000 meq | Freq: Once | ORAL | Status: AC
  Administered 2018-07-04: 40 meq
  Filled 2018-07-04: qty 30

## 2018-07-04 NOTE — Progress Notes (Addendum)
Nutrition Follow-up  DOCUMENTATION CODES:   Severe malnutrition in context of chronic illness  INTERVENTION:   Change to Osmolite 1.5 @ goal rate of 50m/hr- Initiate at 272mhr and increase by 1054mvery 12 hours until goal rate is reached.   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  Ongoing.  GOAL:   Patient will meet greater than or equal to 90% of their needs -not met  MONITOR:   Labs, Weight trends, TF tolerance, Skin, I & O's  ASSESSMENT:   Pt is resident of [facility]25 for urinary retention w/ foley catheter in place and failure to thrive. Previous admit 8/31PMH: emphysema, seizures, subdural heamtoma   Tube feeds decreased to trickle rate of 10ml/h44mer the weekend as pt with increased respiratory effort; it was felt this was r/t tube feedings. Patient tolerated trickle feeds well overnight. Spoke to pt's fiance today; she is ok with advancing the tube feeds to goal rate. Suspect ileus is resolving as fiance reports belching and abdominal distension resolved. Pt has tolerated his tube feeds throughout the entirety of his hospital stay. Pt did have two episodes of vomiting that occurred during deep suctioning; suspect this activated pt's gag reflex. Will attempt to increase tube feeds to goal rate over the next 24-36 hrs. Pt's goal rate of 40ml/hr74monly providing pt with 2.5 tablespoons of volume per hr and 4 cups of volume per day. Pt is not a good candidate for TPN as his small stature puts him at risk for fluid overload. Pt's weight back down to ~5lbs above his admit weight. Pt continues on lasix. Pt getting over 1600ml of 15mume through his tube feeds + IVF; spoke with MD and will discontinue IVF and manage free water through the tube. Pt likely at low to moderate refeeding risk at this point but will recheck Mg and P labs tomorrow as tube feedings advance. RD will monitor closely for tube feed tolerance and adjust tube feeds as necessary. Recommend keep head elevated to 35-45 degrees during tube feedings.    Enteral Access: 16 Fr. G-tube placed surgically on 10/9; patient also has NGT in place but plan is to remove it  Medications reviewed and include: pepcid, lasix, heparin, insulin, MVI, prednisone, vitamin C, LRS _0 /hr, meropenem  Labs reviewed: Na 138 wnl, K 3.0(L), BUN 29(H), P 2.5 wnl, Mg 2.0 wnl Wbc- 23.3(H), Hgb 10.6(L), Hct 32.6(L)  Diet Order:   Diet Order            Diet NPO time specified  Diet effective now             EDUCATION NEEDS:   No education needs have been identified at this time  Skin:  Skin Assessment: Skin Integrity Issues: Skin Integrity Issues:: Stage I, Incisions Stage I: mid sacrum Incisions: closed incision to abdomen  Last BM:  06/28/2018 - large type 7  Height:   Ht Readings from Last 1 Encounters:  06/25/18 _1  (1.803 m)    Weight:   Wt Readings from Last 1 Encounters:  07/03/18 31.3 kg    Ideal Body Weight:  78.2 kg  BMI:  Body mass  index is 9.62 kg/m.  Estimated Nutritional Needs:   Kcal:  1500-1800kcal/day   Protein:  54-60 grams  Fluid:  1 L/day  Koleen Distance MS, RD, LDN Pager #- [phone] Office#- [phone] After Hours Pager: [phone]

## 2018-07-04 NOTE — NC FL2 (Signed)
[place] LEVEL OF CARE SCREENING TOOL     IDENTIFICATION  Patient Name: Alexander Lara Birthdate: 1946-03-04 Sex: male Admission Date (Current Location): 05/30/2018  [place] and [place] Number:  Selena Lesser Medicaid Pending [id number] [place] and Address:  [facility], [address] [place], [place] [address]      Provider Number: [id number]  Attending Physician Name and Address:  Demetrios Loll, MD  Relative Name and Phone Number:  [place], Repka   [phone] Rogers,[name] Sister   [phone] or [place] other   [phone]     Current Level of Care: Hospital Recommended Level of Care: [place] Prior Approval Number:    Date Approved/Denied:   PASRR Number: [id number]  Discharge Plan: SNF    Current Diagnoses: Patient Active Problem List   Diagnosis Date Noted  . Pressure injury of skin 07/01/2018  . Recurrent UTI (urinary tract infection) 06/09/2018  . Adult failure to thrive   . Palliative care by specialist   . Severe protein-calorie malnutrition ([place]) 05/17/2018  . Sepsis ([place]) 05/14/2018    Orientation RESPIRATION BLADDER Height & Weight     Self, Place  O2(HFNC) Indwelling catheter Weight: 69 lb (31.3 kg) Height:  _0  (180.3 cm)  BEHAVIORAL SYMPTOMS/MOOD NEUROLOGICAL BOWEL NUTRITION STATUS      Incontinent Diet, Feeding tube  AMBULATORY STATUS COMMUNICATION OF NEEDS Skin   Extensive Assist Verbally PU Stage and Appropriate Care PU Stage 1 Dressing: (Dressing change every three days.)                     Personal Care Assistance Level of Assistance  Bathing, Feeding, Dressing Bathing Assistance: Maximum assistance Feeding assistance: Maximum assistance Dressing Assistance: Maximum assistance     Functional Limitations Info  Sight, Hearing, Speech Sight Info: Adequate Hearing Info: Adequate Speech Info: Adequate    SPECIAL CARE FACTORS FREQUENCY  Speech therapy,  PT (By licensed PT)     PT Frequency: 5x a week       Speech Therapy Frequency: Minimum 2x a week      Contractures Contractures Info: Not present    Additional Factors Info  Code Status, Allergies, Insulin Sliding Scale Code Status Info: DNR Allergies Info: PENICILLINS, ZOSYN PIPERACILLIN SOD-TAZOBACTAM SO   Insulin Sliding Scale Info: insulin aspart (novoLOG) injection 0-9 Units 3x a day with meals       Current Medications (07/04/2018):  This is the current hospital active medication list Current Facility-Administered Medications  Medication Dose Route Frequency Provider Last Rate Last Dose  . 0.9 %  sodium chloride infusion   Intravenous PRN Loletha Grayer, MD   Stopped at 07/03/18 [phone]  . acetaminophen (TYLENOL) tablet 650 mg  650 mg Per Tube Q6H PRN Wilhelmina Mcardle, MD   650 mg at 06/29/18 1813  . albuterol (PROVENTIL) (2.5 MG/3ML) 0.083% nebulizer solution 2.5 mg  2.5 mg Nebulization Q4H PRN Hillary Bow, MD   2.5 mg at 06/24/18 0059  . amiodarone (PACERONE) tablet 200 mg  200 mg Oral Daily Corey Skains, MD   200 mg at 07/04/18 1322  . chlorhexidine (PERIDEX) 0.12 % solution 15 mL  15 mL Mouth Rinse BID Demetrios Loll, MD   15 mL at 07/03/18 0905  . chlorhexidine gluconate (MEDLINE KIT) (PERIDEX) 0.12 % solution 15 mL  15 mL Mouth Rinse BID Awilda Bill, NP   15 mL at 07/04/18 0838  . famotidine (PEPCID) tablet 20 mg  20 mg Per Tube  BID Charlett Nose, RPH   20 mg at 07/04/18 8182  . feeding supplement (OSMOLITE 1.5 CAL) liquid 1,000 mL  1,000 mL Per Tube Continuous Demetrios Loll, MD 20 mL/hr at 07/04/18 1238 1,000 mL at 07/04/18 1238  . fentaNYL (SUBLIMAZE) injection 25-50 mcg  25-50 mcg Intravenous Q2H PRN Awilda Bill, NP   50 mcg at 06/30/18 0807  . free water 30 mL  30 mL Per Tube Q4H Demetrios Loll, MD      . furosemide (LASIX) tablet 20 mg  20 mg Per Tube BID Lahoma Rocker, MD   20 mg at 07/04/18 0837  . heparin injection 5,000 Units  5,000 Units Subcutaneous  Q8H Lahoma Rocker, MD   5,000 Units at 07/04/18 9937  . insulin aspart (novoLOG) injection 0-9 Units  0-9 Units Subcutaneous TID WC Gouru, Aruna, MD   1 Units at 07/04/18 1149  . ipratropium (ATROVENT) nebulizer solution 0.5 mg  0.5 mg Nebulization TID Epifanio Lesches, MD   0.5 mg at 07/04/18 1327  . levETIRAcetam (KEPPRA) 100 MG/ML solution 500 mg  500 mg Per Tube BID Rosine Door, MD   500 mg at 07/04/18 1696  . MEDLINE mouth rinse  15 mL Mouth Rinse q12n4p Demetrios Loll, MD   15 mL at 07/04/18 1149  . meropenem (MERREM) 1 g in sodium chloride 0.9 % 100 mL IVPB  1 g Intravenous Q12H Paticia Stack, RPH 200 mL/hr at 07/04/18 1147 1 g at 07/04/18 1147  . metoprolol tartrate (LOPRESSOR) injection 2.5-5 mg  2.5-5 mg Intravenous Q3H PRN Wilhelmina Mcardle, MD   5 mg at 07/04/18 7893  . metoprolol tartrate (LOPRESSOR) tablet 12.5 mg  12.5 mg Per Tube BID Tyler Pita, MD   12.5 mg at 07/04/18 0839  . multivitamin liquid 15 mL  15 mL Per Tube Daily Rosine Door, MD   15 mL at 07/04/18 [phone]  . oxyCODONE-acetaminophen (PERCOCET/ROXICET) 5-325 MG per tablet 1 tablet  1 tablet Oral Q8H PRN Epifanio Lesches, MD   1 tablet at 07/03/18 1727  . polyvinyl alcohol (LIQUIFILM TEARS) 1.4 % ophthalmic solution 1 drop  1 drop Both Eyes QID PRN Asencion Gowda, NP   1 drop at 06/18/18 0425  . predniSONE (DELTASONE) tablet 20 mg  20 mg Per Tube Q breakfast Lahoma Rocker, MD   20 mg at 07/04/18 0837  . sodium chloride 0.9 % injection 60 mL  60 mL Per Tube PRN Conforti, John, DO   60 mL at 06/22/18 1130  . sodium chloride flush (NS) 0.9 % injection 10-40 mL  10-40 mL Intracatheter Q12H Loletha Grayer, MD   10 mL at 07/04/18 7510  . sodium chloride flush (NS) 0.9 % injection 10-40 mL  10-40 mL Intracatheter PRN Loletha Grayer, MD   10 mL at 06/24/18 1237  . vitamin C (ASCORBIC ACID) tablet 250 mg  250 mg Per Tube BID Rosine Door, MD   250 mg at 07/04/18 2585     Discharge Medications: Please see  discharge summary for a list of discharge medications.  Relevant Imaging Results:  Relevant Lab Results:   Additional Information SSN [id number]  Ross Ludwig, [place]

## 2018-07-04 NOTE — Consult Note (Signed)
Pharmacy Antibiotic Note  Alexander Lara is a 72 y.o. male admitted on 2018/06/24 with pneumonia.  Pharmacy has been consulted for Meropenem dosing.  Plan: Meropenem 1g q12  Height: 5\' 11"  (180.3 cm) Weight: 69 lb (31.3 kg) IBW/kg (Calculated) : 75.3  Temp (24hrs), Avg:98 F (36.7 C), Min:97.4 F (36.3 C), Max:98.4 F (36.9 C)  Recent Labs  Lab 06/30/18 0424 07/01/18 0634 07/02/18 0524 07/03/18 0555 07/04/18 0351  WBC 15.5* 17.1* 18.6* 27.2* 23.3*  CREATININE 0.75 0.90 0.87 1.07 1.02    Estimated Creatinine Clearance: 28.6 mL/min (by C-G formula based on SCr of 1.02 mg/dL).    Allergies  Allergen Reactions  . Penicillins     Lip Swelling  . Zosyn [Piperacillin Sod-Tazobactam So]     Antimicrobials this admission: Ceftriaxone 9/25 >>9/30 Metronidazole 9/28>>9/30 Unasyn 9/30>>10/18 Merrem 10/11>>   Dose adjustments: 1g q12 still appropriate at this time given crcl of 28.45ml/min  Microbiology results: Bcx: Proteus (Pansensitive), clostridium in both sets   Ucx: proteus mirabilis  10/15 Bcx: NGTD x5d - final (10/20) 10/08: Resp cx: Rare candida   Thank you for allowing pharmacy to be a part of this patient's care.  Albina Billet, PharmD Clinical Pharmacist 07/04/2018 3:10 PM

## 2018-07-04 NOTE — Progress Notes (Signed)
[facility] Encounter Note  Patient: Alexander Lara / Admit Date: 06/07/2018 / Date of Encounter: 07/04/2018, 12:44 PM   Subjective: Patient has had significant improvements and bacteremia and infection with appropriate care listed below.  Patient has had intermittent atrial fibrillation on admission due to bacteremia and cardiovascular stress although currently has been maintaining normal rhythm after amiodarone drip.  There is been no evidence of congestive heart failure or myocardial infarction.  Troponin telemetry shows normal sinus rhythm with pre-atrial contractions  Review of Systems: Not assessed due to patient not conversing Objective: Telemetry: Normal sinus rhythm with pre-atrial contractions Physical Exam: Blood pressure (!) 141/84, pulse 98, temperature (!) 97.4 F (36.3 C), temperature source Oral, resp. rate 20, height '5\' 11"'  (1.803 m), weight 31.3 kg, SpO2 92 %. Body mass index is 9.62 kg/m. General: Poorly l nourished, in no acute distress. Head: Normocephalic, atraumatic, sclera non-icteric, no xanthomas, nares are without discharge. Neck: No apparent masses Lungs: Normal respirations with diffuse wheezes, some rhonchi, no rales , few crackles   Heart: Regular rate and rhythm, normal S1 S2, no murmur, no rub, no gallop, PMI is normal size and placement, carotid upstroke normal without bruit, jugular venous pressure normal Abdomen: Soft, non-tender, non-distended with normoactive bowel sounds. No hepatosplenomegaly. Abdominal aorta is normal size without bruit Extremities: Trace edema, no clubbing, no cyanosis, no ulcers,  Peripheral: 2+ radial, 2+ femoral, 2+ dorsal pedal pulses Neuro: Not alert and oriented.  Does not moves all extremities spontaneously. Psych: Does not responds to questions appropriately with a normal affect.   Intake/Output Summary (Last 24 hours) at 07/04/2018 1244 Last data filed at 07/04/2018 1200 Gross per 24 hour  Intake  1690.06 ml  Output 600 ml  Net 1090.06 ml    Inpatient Medications:  . amiodarone  200 mg Oral Daily  . chlorhexidine  15 mL Mouth Rinse BID  . chlorhexidine gluconate (MEDLINE KIT)  15 mL Mouth Rinse BID  . famotidine  20 mg Per Tube BID  . free water  30 mL Per Tube Q4H  . furosemide  20 mg Per Tube BID  . heparin injection (subcutaneous)  5,000 Units Subcutaneous Q8H  . insulin aspart  0-9 Units Subcutaneous TID WC  . ipratropium  0.5 mg Nebulization TID  . levETIRAcetam  500 mg Per Tube BID  . mouth rinse  15 mL Mouth Rinse q12n4p  . metoprolol tartrate  12.5 mg Per Tube BID  . multivitamin  15 mL Per Tube Daily  . predniSONE  20 mg Per Tube Q breakfast  . sodium chloride flush  10-40 mL Intracatheter Q12H  . vitamin C  250 mg Per Tube BID   Infusions:  . sodium chloride Stopped (07/03/18 0546)  . feeding supplement (OSMOLITE 1.5 CAL) 1,000 mL (07/04/18 1238)  . meropenem (MERREM) IV 1 g (07/04/18 1147)    Labs: Recent Labs    07/02/18 0524  07/03/18 0555 07/04/18 0351  NA 133*  --  136 138  K 3.0*   < > 4.5 3.0*  CL 95*  --  98 99  CO2 27  --  24 24  GLUCOSE 79  --  131* 100*  BUN 26*  --  32* 29*  CREATININE 0.87  --  1.07 1.02  CALCIUM 7.8*  --  8.1* 7.8*  MG 2.1  --  2.2 2.0  PHOS 2.6  --   --  2.5   < > = values in this interval not displayed.  No results for input(s): AST, ALT, ALKPHOS, BILITOT, PROT, ALBUMIN in the last 72 hours. Recent Labs    07/03/18 0555 07/04/18 0351  WBC 27.2* 23.3*  HGB 10.8* 10.6*  HCT 32.9* 32.6*  MCV 82.0 83.4  PLT 182 176   No results for input(s): CKTOTAL, CKMB, TROPONINI in the last 72 hours. Invalid input(s): POCBNP No results for input(s): HGBA1C in the last 72 hours.   Weights: Filed Weights   07/01/18 0455 07/02/18 0527 07/03/18 0500  Weight: 33.1 kg 31.3 kg 31.3 kg     Radiology/Studies:  Dg Chest 1 View  Result Date: 07/06/2018 CLINICAL DATA:  Intubation.  Postop gastrostomy. EXAM: CHEST  1 VIEW  COMPARISON:  07/06/2018 FINDINGS: Endotracheal tube in good position. Left subclavian central venous catheter tip mid SVC. No pneumothorax. Left lower lobe infiltrate unchanged, possible pneumonia. No effusion. COPD Large pneumoperitoneum again noted and unchanged IMPRESSION: Central line SVC. No pneumothorax. Endotracheal tube in good position. Left lower lobe infiltrate Large pneumoperitoneum Electronically Signed   By: Franchot Gallo M.D.   On: 07/08/2018 18:24   Dg Chest 1 View  Addendum Date: 06/30/2018   ADDENDUM REPORT: 06/14/2018 16:58 ADDENDUM: These results were called by telephone at the time of interpretation on 06/16/2018 at 4:58 pm to Dr. Martha Clan , who verbally acknowledged these results. Electronically Signed   By: Franchot Gallo M.D.   On: 06/16/2018 16:58   Result Date: 06/16/2018 CLINICAL DATA:  Oxygen desaturation.  Endoscopy today EXAM: CHEST  1 VIEW COMPARISON:  05/24/2018 FINDINGS: Left lower lobe airspace disease, possible pneumonia. Minimal right lower lobe atelectasis. Negative for heart failure or edema. Apical scarring bilaterally Left arm PICC tip in the SVC. Large pneumoperitoneum IMPRESSION: 1. Left lower lobe infiltrate, possible aspiration pneumonia 2. Large pneumoperitoneum Electronically Signed: By: Franchot Gallo M.D. On: 06/17/2018 16:52   Dg Abd 1 View  Result Date: 06/27/2018 CLINICAL DATA:  Vomiting EXAM: ABDOMEN - 1 VIEW COMPARISON:  06/26/2018; 06/22/2018; CT abdomen and pelvis-06/13/2018 FINDINGS: Re-demonstrated mild patulous distension of the colon, grossly unchanged. No definite evidence of enteric obstruction. No supine evidence of pneumoperitoneum. No pneumatosis or portal venous gas. Gastrostomy tube overlies expected location of mid body of the stomach. Rectal tube overlies expected location of the rectum. No definitive abnormal intra-abdominal calcifications Limited visualization of lower thorax suggests lung hyperexpansion with potential trace  bilateral effusions, incompletely evaluated. Re-demonstrated age-indeterminate compression deformities of the T12 and L2 vertebral bodies, incompletely evaluated. IMPRESSION: Similar findings most suggestive of ileus. Electronically Signed   By: Sandi Mariscal M.D.   On: 06/27/2018 07:05   Dg Abd 1 View  Result Date: 06/26/2018 CLINICAL DATA:  Post intubation. Vomiting. EXAM: ABDOMEN - 1 VIEW COMPARISON:  06/22/2018 FINDINGS: Gastrostomy tube in the left upper quadrant, unchanged in position. Catheter in the pelvis likely representing a rectal catheter. Skin clips along the midline. Improving postoperative pneumoperitoneum since previous study. Gas-filled nondistended colon likely representing ileus. Small bowel distension is resolving. Suggestion of small left pleural effusion. Degenerative changes in the spine and hips. Vascular calcifications. IMPRESSION: Appliances appear to be in satisfactory position. Improving postoperative pneumoperitoneum. Electronically Signed   By: Lucienne Capers M.D.   On: 06/26/2018 04:36   Dg Abd 1 View  Result Date: 06/22/2018 CLINICAL DATA:  Recent bowel perforation EXAM: ABDOMEN - 1 VIEW COMPARISON:  June 21, 2018 FINDINGS: There is less apparent pneumoperitoneum compared to 1 day prior. Patient has had abdominal surgery with skin staples overlying the mid abdomen. Nasogastric  tube tip is in the stomach with the side port at the gastroesophageal junction. Gastrostomy catheter is positioned in the stomach. There are loops of mildly dilated bowel in a pattern suggesting ileus. Bowel obstruction is not felt to be likely. Catheter is noted in the distal sigmoid region. Rectal thermometer in place. There is aortoiliac atherosclerosis. IMPRESSION: Suspect a degree of ileus. Bowel obstruction felt to be less likely. Less pneumoperitoneum evident compared to 1 day prior. Tube positions as described. Nasogastric tube side port is at the gastroesophageal junction. Advise advancing  nasogastric tube 4-5 cm. There is aortoiliac atherosclerosis. Aortic Atherosclerosis (ICD10-I70.0). Electronically Signed   By: Lowella Grip III M.D.   On: 06/22/2018 07:26   Dg Abd 1 View  Result Date: 07/10/2018 CLINICAL DATA:  Perforated bowel EXAM: ABDOMEN - 1 VIEW COMPARISON:  06/25/2018 FINDINGS: Large amount of pneumoperitoneum similar to previous study. This is consistent with the known history of perforated bowel. Scattered gas throughout the colon without significant large or small bowel distention. No radiopaque stones. Vascular calcifications. Catheter in the pelvis could represent bladder or rectal catheter. Degenerative changes in the spine. Surgical clips in the left upper quadrant. IMPRESSION: Large amount of pneumoperitoneum similar to previous study. No evidence of bowel obstruction. Electronically Signed   By: Lucienne Capers M.D.   On: 07/12/2018 21:22   Dg Abd 1 View  Result Date: 06/19/2018 CLINICAL DATA:  Low oxygen saturation.  Feeding tube placement. EXAM: ABDOMEN - 1 VIEW COMPARISON:  06/17/2018 FINDINGS: Large pneumoperitoneum Metal clips overlying the stomach. No gastrostomy tube is identified. Foley catheter in the bladder.  No acute bony abnormality. IMPRESSION: Large pneumoperitoneum. Metal clips overlying the stomach presumably related to gastrostomy tube placement. These results were called by telephone at the time of interpretation on 07/11/2018 at 4:56 pm to Dr. Rosey Bath, who verbally acknowledged these results. Electronically Signed   By: Franchot Gallo M.D.   On: 06/20/2018 16:58   Dg Abd 1 View  Result Date: 06/17/2018 CLINICAL DATA:  NG tube placement. EXAM: ABDOMEN - 1 VIEW COMPARISON:  06/17/2018. FINDINGS: Dobbhoff tube noted with its tip now noted over the distal stomach. No bowel distention. Multifocal bilateral subsegmental atelectasis/pulmonary mild infiltrates. IMPRESSION: 1. Dobbhoff tube noted with its tip now noted over the distal stomach. 2.  Multifocal mild bilateral subsegmental atelectasis/pulmonary infiltrates. Electronically Signed   By: Marcello Moores  Register   On: 06/17/2018 15:25   Dg Abd 1 View  Result Date: 06/17/2018 CLINICAL DATA:  Feeding tube placement EXAM: ABDOMEN - 1 VIEW COMPARISON:  CT abdomen 06/13/2018 FINDINGS: The feeding tube extends into the stomach and is facing cephalad in the stomach fundus region. Tubing projecting over the right chest likely represents a PICC line. Emphysema noted.  Compression fractures observed at T12 and L2. IMPRESSION: 1. Feeding tube tip is in the stomach fundus, oriented cephalad. 2.  Emphysema (ICD10-J43.9). 3. Compression fractures at T12 and L2. Electronically Signed   By: Van Clines M.D.   On: 06/17/2018 12:39   Ct Head Wo Contrast  Result Date: 06/16/2018 CLINICAL DATA:  Altered mental status, history of seizures, failure to thrive, former smoker, history of prior subdural hematoma EXAM: CT HEAD WITHOUT CONTRAST TECHNIQUE: Contiguous axial images were obtained from the base of the skull through the vertex without intravenous contrast. Sagittal and coronal MPR images reconstructed from axial data set. COMPARISON:  None FINDINGS: Brain: Generalized cerebral and cerebellar atrophy. Normal ventricular morphology. No midline shift or mass effect. Small vessel chronic ischemic changes  of deep cerebral white matter. 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 No intracranial hemorrhage, mass lesion, evidence of acute infarction, or extra-axial fluid collection. Vascular: Mild atherosclerotic calcification of internal carotid arteries bilaterally at skull base. No hyperdense vessels. Skull: Demineralized but intact Sinuses/Orbits: Clear paranasal sinuses. Minimal fluid within a few RIGHT mastoid air cells. Other: N/A IMPRESSION: Atrophy with small vessel chronic ischemic changes of deep cerebral white matter. 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 No acute intracranial abnormalities. Electronically  Signed   By: Lavonia Dana M.D.   On: 06/16/2018 15:32   Ct Chest W Contrast  Result Date: 06/13/2018 CLINICAL DATA:  Unintentional weight loss. EXAM: CT CHEST, ABDOMEN, AND PELVIS WITH CONTRAST TECHNIQUE: Multidetector CT imaging of the chest, abdomen and pelvis was performed following the standard protocol during bolus administration of intravenous contrast. CONTRAST:  84m OMNIPAQUE IOHEXOL 300 MG/ML  SOLN COMPARISON:  None. FINDINGS: CT CHEST FINDINGS Cardiovascular: Small pericardial effusion with a maximum thickness of 10 mm. Atheromatous calcifications, including the coronary arteries and aorta. Mediastinum/Nodes: No enlarged mediastinal, hilar, or axillary lymph nodes. Thyroid gland, trachea, and esophagus demonstrate no significant findings. Lungs/Pleura: The lungs are hyperexpanded with mild bullous changes. Irregular patchy densities in the right lower lobe and inferior right upper lobe. Minimal bilateral dependent atelectasis. No pleural fluid. Musculoskeletal: Approximately 30% T11 vertebral body superior endplate compression deformity with sclerosis and minimal bony retropulsion. No acute fracture lines. CT ABDOMEN PELVIS FINDINGS Hepatobiliary: No focal liver abnormality is seen. No gallstones, gallbladder wall thickening, or biliary dilatation. Pancreas: Unremarkable. No pancreatic ductal dilatation or surrounding inflammatory changes. Spleen: Normal in size without focal abnormality. Adrenals/Urinary Tract: Foley catheter in the urinary bladder with associated air in the bladder. Multiple dependent calculi in the urinary bladder. These are difficult to separate from each other with the largest individual calculus that can be separated measuring 5 mm in diameter. Normal appearing adrenal glands, kidneys and ureters. Stomach/Bowel: Limited due to the lack of intravenous and oral contrast and lack of body fat. No gross abnormality of the stomach, small bowel or colon seen. No evidence of  appendicitis. Vascular/Lymphatic: Atheromatous arterial calcifications without aneurysm. No enlarged lymph nodes. Reproductive: Normal sized prostate gland. Other: No abdominal wall hernia or abnormality. No abdominopelvic ascites. Musculoskeletal: Changes of avascular necrosis involving both femoral heads without bony collapse. Approximately 40% old L2 superior endplate compression deformity with minimal bony retropulsion, sclerosis and no acute fracture lines. Mild lumbar spine degenerative changes. IMPRESSION: 1. Patchy densities in the right lower lobe and inferior right upper lobe, compatible with incompletely resolved pneumonia. Follow-up chest radiographs are recommended until this completely clears. 2. Mild-to-moderate changes of COPD. 3. Small pericardial effusion. 4. Multiple dependent calculi in the urinary bladder. 5. 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 Aortic Atherosclerosis (ICD10-I70.0) and Emphysema (ICD10-J43.9). Electronically Signed   By: SClaudie ReveringM.D.   On: 06/13/2018 15:45   Ct Abdomen Pelvis W Contrast  Result Date: 06/30/2018 CLINICAL DATA:  72year old male with Proteus UTI, bacteremia and sepsis from chronic indwelling Foley. Acute hypoxic respiratory failure necessitating intubation. Bowel perforation status post exploratory laparotomy and repair of gastric perforation on 06/22/2018. EXAM: CT ABDOMEN AND PELVIS WITH CONTRAST TECHNIQUE: Multidetector CT imaging of the abdomen and pelvis was performed using the standard protocol following bolus administration of intravenous contrast. CONTRAST:  629mOMNIPAQUE IOHEXOL 300 MG/ML  SOLN COMPARISON:  CT Abdomen and Pelvis 06/13/2018. FINDINGS: Lower chest: Lower lobe bronchiectasis appears progressed since September along with widespread peribronchial nodularity  at both lung bases. Superimposed small layering left pleural effusion and atelectasis versus early consolidation in the posterior basal segment of the  left lower lobe. No pericardial effusion. Hepatobiliary: The gallbladder is contracted. The gallbladder wall is mildly enhancing. The liver appears normal. Mild periportal edema or intrahepatic bile duct enlargement has regressed/resolved since September. Pancreas: Negative. Spleen: Negative. Adrenals/Urinary Tract: Negative adrenal glands. Bilateral renal enhancement and contrast excretion is symmetric and within normal limits. However, despite the presence of a catheter within the bladder the urinary bladder is distended with an estimated volume of 539 milliliters. There are multiple layering stones within the bladder as well as a small volume of non dependent gas (series 2, image 62. There is mild bilateral hydronephrosis and proximal hydroureter. Stomach/Bowel: Decompressed large bowel from the transverse colon to the rectum. There may be a small volume of fluid within those segments of bowel. The cecum is mildly to moderately distended with gas and fluid. There appears to be abrupt transition of the large bowel caliber at the hepatic flexure on series 2, image 33. Oral contrast has reached the distal small bowel but not yet the terminal ileum. There are no dilated small bowel loops. There is a percutaneous gastrostomy. The stomach is mildly distended with contrast and otherwise unremarkable. The duodenum appears normal. No abdominal free air.  No definite free fluid. Vascular/Lymphatic: Aortoiliac calcified atherosclerosis. Major arterial structures in the abdomen and pelvis are patent although right common iliac artery stenosis is identified on series 2, image 45. The portal venous system appears to remain patent. No lymphadenopathy identified. Reproductive: Urinary catheter in place.  Otherwise negative. Other: No pelvic free fluid. Postoperative changes to the ventral abdominal wall with no adverse features. Musculoskeletal: T12 and L2 compression fractures are stable since September. No acute osseous  abnormality identified. IMPRESSION: 1. Abnormal lung bases with progressed Bronchiectasis and widespread peribronchial nodularity since the CT on 06/13/2018. There is a small layering left pleural effusion, with mild left lower lobe consolidation or atelectasis. This might reflect sequelae of bilateral aspiration, although hematogenously disseminated infection is also possible. 2. Distended urinary bladder despite the presence of a catheter. Estimated bladder volume 539 mL and mild associated bilateral hydronephrosis and proximal hydroureter. Multiple small stones are layering within the bladder. 3. Air and fluid distended cecum and ascending colon with abrupt transition to decompressed colon at the hepatic flexure. However, there is no small bowel dilatation to strongly indicate this is a mechanical bowel obstruction. Perhaps this is instead a large bowel ileus. 4. No free air or free fluid identified. 5. Aortic Atherosclerosis (ICD10-I70.0). Right common iliac artery stenosis. Electronically Signed   By: Genevie Ann M.D.   On: 06/30/2018 16:50   Ct Abdomen Pelvis W Contrast  Result Date: 06/13/2018 CLINICAL DATA:  Unintentional weight loss. EXAM: CT CHEST, ABDOMEN, AND PELVIS WITH CONTRAST TECHNIQUE: Multidetector CT imaging of the chest, abdomen and pelvis was performed following the standard protocol during bolus administration of intravenous contrast. CONTRAST:  30m OMNIPAQUE IOHEXOL 300 MG/ML  SOLN COMPARISON:  None. FINDINGS: CT CHEST FINDINGS Cardiovascular: Small pericardial effusion with a maximum thickness of 10 mm. Atheromatous calcifications, including the coronary arteries and aorta. Mediastinum/Nodes: No enlarged mediastinal, hilar, or axillary lymph nodes. Thyroid gland, trachea, and esophagus demonstrate no significant findings. Lungs/Pleura: The lungs are hyperexpanded with mild bullous changes. Irregular patchy densities in the right lower lobe and inferior right upper lobe. Minimal bilateral  dependent atelectasis. No pleural fluid. Musculoskeletal: Approximately 30% T11 vertebral body superior  endplate compression deformity with sclerosis and minimal bony retropulsion. No acute fracture lines. CT ABDOMEN PELVIS FINDINGS Hepatobiliary: No focal liver abnormality is seen. No gallstones, gallbladder wall thickening, or biliary dilatation. Pancreas: Unremarkable. No pancreatic ductal dilatation or surrounding inflammatory changes. Spleen: Normal in size without focal abnormality. Adrenals/Urinary Tract: Foley catheter in the urinary bladder with associated air in the bladder. Multiple dependent calculi in the urinary bladder. These are difficult to separate from each other with the largest individual calculus that can be separated measuring 5 mm in diameter. Normal appearing adrenal glands, kidneys and ureters. Stomach/Bowel: Limited due to the lack of intravenous and oral contrast and lack of body fat. No gross abnormality of the stomach, small bowel or colon seen. No evidence of appendicitis. Vascular/Lymphatic: Atheromatous arterial calcifications without aneurysm. No enlarged lymph nodes. Reproductive: Normal sized prostate gland. Other: No abdominal wall hernia or abnormality. No abdominopelvic ascites. Musculoskeletal: Changes of avascular necrosis involving both femoral heads without bony collapse. Approximately 40% old L2 superior endplate compression deformity with minimal bony retropulsion, sclerosis and no acute fracture lines. Mild lumbar spine degenerative changes. IMPRESSION: 1. Patchy densities in the right lower lobe and inferior right upper lobe, compatible with incompletely resolved pneumonia. Follow-up chest radiographs are recommended until this completely clears. 2. Mild-to-moderate changes of COPD. 3. Small pericardial effusion. 4. Multiple dependent calculi in the urinary bladder. 5. 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 Aortic Atherosclerosis (ICD10-I70.0)  and Emphysema (ICD10-J43.9). Electronically Signed   By: Claudie Revering M.D.   On: 06/13/2018 15:45   US Venous Img Lower Bilateral  Result Date: 06/30/2018 CLINICAL DATA:  Bilateral lower extremity edema. EXAM: BILATERAL LOWER EXTREMITY VENOUS DOPPLER ULTRASOUND TECHNIQUE: Gray-scale sonography with graded compression, as well as color Doppler and duplex ultrasound were performed to evaluate the lower extremity deep venous systems from the level of the common femoral vein and including the common femoral, femoral, profunda femoral, popliteal and calf veins including the posterior tibial, peroneal and gastrocnemius veins when visible. The superficial great saphenous vein was also interrogated. Spectral Doppler was utilized to evaluate flow at rest and with distal augmentation maneuvers in the common femoral, femoral and popliteal veins. COMPARISON:  None. FINDINGS: RIGHT LOWER EXTREMITY Common Femoral Vein: Echogenic wall thickening. Saphenofemoral Junction: Patent. Profunda Femoral Vein: No evidence of thrombus. Normal compressibility and flow on color Doppler imaging. Femoral Vein: Diffuse echogenic wall thickening. Popliteal Vein: Mild wall thickening. Calf Veins: Patent visualized segments. Superficial Great Saphenous Vein: No evidence of thrombus. Normal compressibility. Venous Reflux:  None. Other Findings:  None. LEFT LOWER EXTREMITY Common Femoral Vein: Echogenic wall thickening with patent lumen. Saphenofemoral Junction: Patent. Profunda Femoral Vein: No evidence of thrombus. Normal compressibility and flow on color Doppler imaging. Femoral Vein: Diffuse wall thickening. Popliteal Vein: Wall thickening, luminal narrowing and poor flow. Calf Veins: Poor flow in calf veins. Superficial Great Saphenous Vein: No evidence of thrombus. Normal compressibility. Venous Reflux:  None. Other Findings:  None. IMPRESSION: Deep veins of both lower extremities demonstrate echogenic wall thickening consistent with prior  thrombosis. There are no findings to suggest acute DVT and patent flow is present in the deep venous segments. There is poor/slow flow noted, however in the left popliteal vein and proximal left calf veins. Electronically Signed   By: Aletta Edouard M.D.   On: 06/30/2018 16:40   US Venous Img Upper Uni Right  Result Date: 06/24/2018 CLINICAL DATA:  Edema EXAM: RIGHT UPPER EXTREMITY VENOUS DOPPLER ULTRASOUND TECHNIQUE: Gray-scale sonography with graded  compression, as well as color Doppler and duplex ultrasound were performed to evaluate the upper extremity deep venous system from the level of the subclavian vein and including the jugular, axillary, basilic and upper cephalic vein. Spectral Doppler was utilized to evaluate flow at rest and with distal augmentation maneuvers. COMPARISON:  None. FINDINGS: Thrombus within deep veins:  None visualized. Compressibility of deep veins:  Normal. Duplex waveform respiratory phasicity:  Normal. Duplex waveform response to augmentation:  Normal. Venous reflux:  None visualized. Other findings: On routine limited images of the contralateral left subclavian vein, incompletely occlusive thrombus is noted around PICC line. IMPRESSION: 1. Negative for right upper extremity DVT. 2. POSITIVE for nonocclusive DVT in the left subclavian vein adjacent to PICC catheter. Electronically Signed   By: Lucrezia Europe M.D.   On: 06/24/2018 12:28   Dg Chest Port 1 View  Result Date: 07/04/2018 CLINICAL DATA:  Left PICC line placement. EXAM: PORTABLE CHEST 1 VIEW COMPARISON:  Chest x-ray 07/02/2018 FINDINGS: The cardiac silhouette, mediastinal and hilar contours are within normal limits and stable. There is mild tortuosity and calcification of the thoracic aorta. Stable underlying emphysematous changes and apical pulmonary scarring. Left lower lobe density persists and suspicious for infiltrate. The left PICC line tip is approximately 3 cm below the carina. No complicating features. The  bony thorax is intact. IMPRESSION: Left PICC line in good position without complicating features. Stable underlying emphysematous changes. Left lower lobe infiltrate. Electronically Signed   By: Marijo Sanes M.D.   On: 07/04/2018 11:02   Dg Chest Port 1 View  Result Date: 07/02/2018 CLINICAL DATA:  Cough EXAM: PORTABLE CHEST 1 VIEW COMPARISON:  06/30/2018 chest radiograph. FINDINGS: Left PICC terminates at the cavoatrial junction. Stable cardiomediastinal silhouette with normal heart size. No pneumothorax. Hyperinflated lungs. Small dependent left pleural effusion is stable. Emphysema. No pulmonary edema. Worsened dense left retrocardiac consolidation. Stable mild patchy right lung base opacity. IMPRESSION: 1. Worsened dense left retrocardiac consolidation and stable mild patchy right lung base opacity suggesting worsening multilobar pneumonia. 2. Stable small left pleural effusion. 3. Hyperinflated lungs and emphysema, suggesting COPD. Electronically Signed   By: Ilona Sorrel M.D.   On: 07/02/2018 22:35   Dg Chest Port 1 View  Result Date: 06/30/2018 CLINICAL DATA:  Shortness of breath EXAM: PORTABLE CHEST 1 VIEW COMPARISON:  06/29/2018 FINDINGS: Heart size and pulmonary vascularity are normal. Severe emphysematous changes throughout the lungs with peribronchial thickening and central interstitial pattern likely representing chronic bronchitis. Mild residual patchy perihilar infiltration is improving since previous studies suggesting resolving pneumonia. Mild blunting of left costophrenic angle likely a small pleural effusion. No pneumothorax. Calcification of the aorta. A left PICC line is present with tip over the low SVC region. IMPRESSION: Severe emphysematous changes and chronic bronchitic changes in the lungs. Improving perihilar infiltration since previous study. Small left pleural effusion. Electronically Signed   By: Lucienne Capers M.D.   On: 06/30/2018 06:07   Dg Chest Port 1 View  Result  Date: 06/29/2018 CLINICAL DATA:  Shortness of breath. EXAM: PORTABLE CHEST 1 VIEW COMPARISON:  06/28/2018. FINDINGS: Unchanged support tubes and lines. COPD with hyperinflation. BILATERAL pulmonary opacities show minimal improvement, particularly LEFT upper lobe. IMPRESSION: Slight improvement aeration. Electronically Signed   By: Staci Righter M.D.   On: 06/29/2018 07:41   Dg Chest Port 1 View  Result Date: 06/28/2018 CLINICAL DATA:  Acute respiratory failure. History of COPD. Patient admitted 05/22/2018 with pneumonia and urinary tract infection. EXAM: PORTABLE CHEST 1  VIEW COMPARISON:  Single-view of the chest 06/27/2018, 06/26/2018 and 06/23/2018. FINDINGS: Marked emphysema is again seen. Endotracheal tube and left PICC remain in place in good position. Patchy bilateral airspace disease is worse on the left and demonstrates continued improvement. No pneumothorax or pleural fluid. Heart size is normal. IMPRESSION: Continued improvement in bilateral pneumonia. Emphysema. Support apparatus projects in good position. Electronically Signed   By: Inge Rise M.D.   On: 06/28/2018 08:49   Dg Chest Port 1 View  Result Date: 06/27/2018 CLINICAL DATA:  Acute respiratory failure EXAM: PORTABLE CHEST 1 VIEW COMPARISON:  06/26/2018; 06/23/2018; 06/22/2018 FINDINGS: Grossly unchanged cardiac silhouette and mediastinal contours with atherosclerotic plaque within the thoracic aorta. Stable position of support apparatus. The lungs remain hyperexpanded with flattening the bilaterally diaphragms. Ill-defined heterogeneous interstitial and potential developing airspace opacities within the left mid and lower lung as well as the right mid lung are unchanged. No definite evidence of edema. There is blunting of the bilateral costophrenic angles suggestive of trace bilateral effusions. No pneumothorax. No definite acute osseus abnormalities. IMPRESSION: 1.  Stable positioning of support apparatus.  No pneumothorax. 2.  Grossly unchanged bilateral airspace opacities, left greater than right, with broad differential considerations including aspiration and/or atypical infection. 3. Unchanged trace bilateral effusions. Electronically Signed   By: Sandi Mariscal M.D.   On: 06/27/2018 07:07   Dg Chest Port 1 View  Result Date: 06/26/2018 CLINICAL DATA:  Post intubation. Vomiting. EXAM: PORTABLE CHEST 1 VIEW COMPARISON:  06/26/2018 FINDINGS: Endotracheal tube placed with tip measuring 5.5 cm above the carina. Left PICC catheter with tip over the cavoatrial junction region. No pneumothorax. Heart size and pulmonary vascularity are normal. Patchy airspace disease in both mid and lower lungs, greater on the left. This could represent edema, ARDS, or aspiration. Small left pleural effusion. Calcification of the aorta. IMPRESSION: Appliances appear to be in satisfactory position. Bilateral airspace disease in the lungs, greater on the left. This could represent edema, ARDS, or aspiration. Small left pleural effusion. Electronically Signed   By: Lucienne Capers M.D.   On: 06/26/2018 04:37   Dg Chest Port 1 View  Result Date: 06/26/2018 CLINICAL DATA:  72 year old with history of shortness of breath. History of emphysema. EXAM: PORTABLE CHEST 1 VIEW COMPARISON:  Chest x-ray 06/23/2018. FINDINGS: There is a left upper extremity PICC with tip terminating in the distal superior vena cava. Previously noted endotracheal tube has been withdrawn. Diffuse peribronchial cuffing with patchy multifocal ill-defined airspace consolidation in the lungs bilaterally, most severe throughout the left upper lobe. Moderate left pleural effusion. Small right pleural effusion. Opacity at the left lung base which may reflect atelectasis and/or consolidation. Pulmonary vasculature does not appear engorged. Heart size is normal. Upper mediastinal contours are within normal limits. Aortic atherosclerosis. IMPRESSION: 1. Support apparatus, as above. 2. Worsening  bronchitis with developing multilobar bronchopneumonia in the lungs bilaterally (left greater than right). 3. Increasing moderate left and small right pleural effusion. Electronically Signed   By: Vinnie Langton M.D.   On: 06/26/2018 01:58   Dg Chest Port 1 View  Result Date: 06/23/2018 CLINICAL DATA:  Ventilated patient.  History of emphysema. EXAM: PORTABLE CHEST 1 VIEW COMPARISON:  06/22/2018 FINDINGS: The enteric catheter has been removed. Left PICC line and endotracheal tube in stable position. Cardiomediastinal silhouette is normal. Mediastinal contours appear intact. Calcific atherosclerotic disease of the aorta. There is no evidence of pleural effusion or pneumothorax. Focal airspace consolidation the left lower lobe, more prominent than on the  prior radiograph. Scattered peribronchial airspace consolidation in the right lower lobe. Baseline emphysematous changes. Osseous structures are without acute abnormality. Decreased volume of known pneumoperitoneum. IMPRESSION: Worsening of left lower thorax airspace consolidation. Stable appearance of patchy peribronchial right lower lobe airspace consolidation. Decreased volume pneumoperitoneum. Electronically Signed   By: Fidela Salisbury M.D.   On: 06/23/2018 11:31   Dg Chest Port 1 View  Result Date: 06/22/2018 CLINICAL DATA:  Hypoxia EXAM: PORTABLE CHEST 1 VIEW COMPARISON:  June 21, 2018 FINDINGS: Endotracheal tube tip is 7.7 cm above the carina. Central catheter tip is in the superior cava. Nasogastric tube tip is in the stomach with the side port essentially at the gastroesophageal junction. No evident pneumothorax. Lungs are hyperexpanded. There is airspace consolidation consistent with pneumonia in the left lower lobe. There is patchy atelectatic change in the right mid lung and right base regions. The pneumoperitoneum seen 1 day prior is less apparent currently. Note skin staples in the upper abdomen indicating recent abdominal surgery.  IMPRESSION: 1. Tube and catheter positions as described without pneumothorax. Side port of the nasogastric tube is at the gastroesophageal junction. It may be prudent to consider advancing the nasogastric tube 4-5 cm. 2. Lungs hyperexpanded. Airspace consolidation left lower lobe consistent with pneumonia. Areas of patchy atelectasis right mid lung and right base. 3.  Pneumoperitoneum less well appreciable compared to 1 day prior. 4.  Stable cardiac silhouette. Electronically Signed   By: Lowella Grip III M.D.   On: 06/22/2018 07:24   Dg Chest Portable 1 View  Result Date: 05/27/2018 CLINICAL DATA:  Cough today with cloudy urine and recent diagnosis of right lower lobe pneumonia 3 weeks ago. History of emphysema and former smoker. EXAM: PORTABLE CHEST 1 VIEW COMPARISON:  None. FINDINGS: Emphysematous hyperinflation of lungs. The patient is slightly rotated on this study accentuating the paramediastinal soft tissues on the right. Partial clearing of previously noted right lower lobe pneumonia. Minimal right upper lobe pneumonia is also noted, similar in appearance to prior abutting the minor fissure. No new pulmonary consolidations. No effusion or pneumothorax. Mild aortic atherosclerosis. No acute osseous abnormality. IMPRESSION: Partial clearing of right sided pneumonia in the background of emphysema. Electronically Signed   By: Ashley Royalty M.D.   On: 05/23/2018 23:32   Dg Abd Portable 1v  Result Date: 07/01/2018 CLINICAL DATA:  Follow-up ileus EXAM: PORTABLE ABDOMEN - 1 VIEW COMPARISON:  06/30/2018 FINDINGS: Scattered large and small bowel gas is noted. No obstructive changes are seen. No free air is noted. Urinary catheter is noted in the midline. Diffuse vascular calcifications are seen. Gastrostomy catheter is noted over the midline. IMPRESSION: No obstructive changes are noted. Electronically Signed   By: Inez Catalina M.D.   On: 07/01/2018 14:52   Dg Duanne Limerick W/water Sol Cm  Result Date:  06/22/2018 CLINICAL DATA:  Gastric perforation following PEG tube placement. Subsequent gastric repair. Evaluate for integrity of repair. EXAM: WATER SOLUBLE UPPER GI SERIES TECHNIQUE: Single-column upper GI series was performed using water soluble contrast. CONTRAST:  154m ISOVUE-300 IOPAMIDOL (ISOVUE-300) INJECTION 61% COMPARISON:  None FLUOROSCOPY TIME:  Fluoroscopy Time:  3 minutes 18 seconds Radiation Exposure Index (if provided by the fluoroscopic device): 22.7 mGy Number of Acquired Spot Images: 0 FINDINGS: Real-time fluoroscopy was utilized for evaluation of the stomach. Isovue 300 contrast was hand injected through an existing nasogastric tube opacifying the stomach. Balloon from the PEG tube is delineated within the stomach. Patient was position in the right lateral decubitus position for  contrast to flow into the body and antrum of the stomach. No extraluminal contrast is identified to suggest persistent perforation. IMPRESSION: No extraluminal contrast to suggest active gastric perforation. Electronically Signed   By: Kathreen Devoid   On: 06/22/2018 13:10   Dg Swallowing Func-speech Pathology  Result Date: 06/28/2018 Please refer to "Notes" tab for Speech Pathology notes.  [place] Ekg Site Rite  Result Date: 06/23/2018 If Site Rite image not attached, placement could not be confirmed due to current cardiac rhythm.  [place] Ekg Site Rite  Result Date: 06/13/2018 If Site Rite image not attached, placement could not be confirmed due to current cardiac rhythm.    Assessment and Recommendation  72 y.o. male with significant cachexia and paroxysmal nonvalvular atrial fibrillation likely secondary to recent illness improving with control with amiodarone without evidence of myocardial infarction or congestive heart failure 1.  Change amiodarone to oral amiodarone 200 mg per G-tube and continuation of metoprolol for heart rate control and pre-atrial contractions 2.  No further cardiovascular diagnostic  testing necessary 3.  Continue furosemide for concerns of pulmonary edema lower extremity edema 4.  No further restrictions to care 5.  Call if further questions Signed, Serafina Royals M.D. FACC

## 2018-07-04 NOTE — Progress Notes (Signed)
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  On admission he was nonverbal and cachectic and his vitals were pulse of 105, temperature of 98.7, blood pressure of 138/76.  He had decreased breath sounds in the right lower field and left lower feet.  He he had a WBC of 11.5 and hemoglobin of 9.9 on admission.  He had a blood glucose of 108 and creatinine of 1 on admission he was admitted as recurrent UTI and was started on ceftriaxone.  Blood cultures and urine cultures were sent before starting antibiotic.  The blood culture came back positive for Proteus and Clostridium species.  The urine culture had Proteus.  He was switched to Unasyn on 06/13/2018 his hospital stay has been complicated by the following.  Because of failure to thrive they attempted to place PEG on 07/10/2018 which was complicated by the blade piercing through the stomach and the procedure was abandoned.  Following which she developed respiratory distress and also his had to be intubated.  The x-ray abdomen showed air.  He was taken for urgent laparotomy on 06/22/2018.  Gastric perforation was noted which was repaired and the PEG was placed.  There was also a hemoglobin drop up to 5.6 and he was given PRBC.  He was in the ICU.  He got extubated on 06/23/2018.  And tube feeds started on 06/24/2018 .on 06/26/2018 he had to be reintubated for respiratory distress and suspicion for aspiration.  He was started on meropenem on 06/26/2018  He also was diagnosed with DVT of the left upper extremity and nonocclusive subclavian DVT associated with PICC line by ultrasound. I am asked to see the patient as he has had low-grade fever and some leukocytosis  in spite of being on antibiotics.  Antibiotic regimen in the hospital as follows Ceftriaxone 05/18/2018 until 06/13/2018 Flagyl 928 10/23/1928 Unasyn 9 3210 8 Zosyn 10 8-10 11 Vancomycin 10/ 8/ 19 dose Meropenem since 06/24/2018  Fever curve in the hospital For the past 2 weeks   Objective:  VITALS:  Patient Vitals for the past 24 hrs:  BP Temp Temp src Pulse Resp SpO2  07/04/18 0836 (!) 141/84 (!) 97.4 F (36.3 C) Oral 98 20 92 %  07/04/18 0738 - - - - - 92 %  07/04/18 0407 135/86 98.3 F (36.8 C) - 72 18 93 %  07/03/18 2201 - - - (!) 123 - -  07/03/18 2131 - - - - - 94 %  07/03/18 2104 - - - - - 98 %  07/03/18 1922 120/75 - - 72 - -  07/03/18 1921 (!) 51/35 98.4 F (36.9 C) Oral (!) 52 20 94 %  07/03/18 1735 135/81 - - (!) 107 - 96 %  07/03/18 1408 - - - - - 94 %   PHYSICAL EXAM:  General: Emaciated, lethargic, non verbal, opens eyes on calling his name Neck: Supple,  Back: Did not examine Lungs: b/l air entry- decreased bases Heart: s1s2 Abdomen: Soft,PEG, surgical scar healthy . No masses Extremities: bruises arms Skin: bruises Lymph: Cervical, supraclavicular normal. Neurologic: did not examine in detail foley Pertinent Labs CBC Latest Ref Rng & Units 07/04/2018 07/03/2018 07/02/2018  WBC 4.0 - 10.5 K/uL 23.3(H) 27.2(H) 18.6(H)  Hemoglobin 13.0 -  17.0 g/dL 10.6(L) 10.8(L) 10.0(L)  Hematocrit 39.0 - 52.0 % 32.6(L) 32.9(L) 30.6(L)  Platelets 150 - 400 K/uL 176 182 163   CMP Latest Ref Rng & Units 07/04/2018 07/03/2018 07/02/2018  Glucose 70 - 99 mg/dL 454(U) 981(X) -  BUN 8 - 23 mg/dL 91(Y) 78(G) -  Creatinine 0.61 - 1.24 mg/dL 9.56 2.13 -  Sodium 086 - 145 mmol/L 138 136 -  Potassium 3.5 - 5.1 mmol/L 3.0(L) 4.5 5.2(H)  Chloride 98 - 111 mmol/L 99 98 -  CO2 22 - 32 mmol/L 24 24 -  Calcium 8.9 - 10.3 mg/dL 7.8(L) 8.1(L) -  Total Protein 6.5 - 8.1 g/dL - - -  Total Bilirubin 0.3 - 1.2 mg/dL - - -  Alkaline Phos 38 - 126 U/L - - -  AST 15 - 41 U/L - - -    ALT 0 - 44 U/L - - -      IMAGING RESULTS: ? 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  In Hospital since 06/12/2018  Low grade fever and leucocytosis- CT scan  showed Bladder distension, hydronephrosis (inspite of foley) and bladder stones. Foley catheter changed. Clear urine  Chronic changes lower lobes- reviewed serial CXR -no new infiltrates in the past days. Likely atelectasis VS aspiration- Dc meropenem- check procal. Has been on antibiotics for  25 days. Will DC all and observe  Clostridium species /proteus bacteremia on admission- treated. Risk of colon cancer with clostridium.  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 Discussed with his partner in great detail

## 2018-07-04 NOTE — Clinical Social Work Note (Addendum)
CSW received phone call from patient's Alexander Lara, [phone]  regarding updates on SNF bed placements.  CSW informed her that updated clinical information was sent to all SNFs to see if they are able to accept patient.  CSW informed her that CSW will continue to follow patient's progress throughout discharge planning.  Patient's fiancee would like [facility] or [facility] if possible.  CSW informed her that because he is Medicaid Pending the options will be very limited on which facility is able to accept patient.  Patient's fiancee expressed understanding, and gave CSW permission to send updated clinicals information to SNFs.  CSW continuing to follow patient's progress throughout discharge planning.  Ervin Knack. Hassan Rowan, MSW, Theresia Majors [phone]  07/04/2018 6:38 PM

## 2018-07-04 NOTE — Consult Note (Signed)
Pharmacy Electrolyte Monitoring Consult:  Pharmacy consulted to assist in monitoring and replacing electrolytes in this 72 y.o. male admitted on 23-Jun-2018 with Urinary Retention  Patient admitted to ICU s/p PEG placement. Now on telemetry unit.  Pt with AFib  Labs:  Sodium (mmol/L)  Date Value  07/04/2018 138   Potassium (mmol/L)  Date Value  07/04/2018 3.0 (L)   Magnesium (mg/dL)  Date Value  16/06/9603 2.0   Phosphorus (mg/dL)  Date Value  54/05/8118 2.6   Calcium (mg/dL)  Date Value  14/78/2956 7.8 (L)   Albumin (g/dL)  Date Value  21/30/8657 2.4 (L)   Corrected Calcium: 9.58  Assessment/Plan: Electrolyte goals: Potassium ~4.0, Magnesium ~2.0 due to afib.  Patient continued on furosemide 20mg  per tube BID  Patient dosed Potassium Chloride packets q2h x 2 per protocol  Recheck potassium at 1800  Pharmacy will continue to monitor and adjust per consult.   Charmayne Sheer [place], [facility] 07/04/2018 7:18 AM

## 2018-07-04 NOTE — Consult Note (Signed)
Pharmacy Antibiotic Note  Alexander Lara is a 72 y.o. male admitted on 06/07/2018 with pneumonia.  Pharmacy has been consulted for vancomycin dosing.  Plan: Vancomycin 500 IV every 36 hours.  Goal trough 15-20 mcg/mL. Will check trough prior to the fourth dose.  Due to patient weight and renal function did not use stacked or loading dose.  Height: 5\' 11"  (180.3 cm) Weight: 69 lb (31.3 kg) IBW/kg (Calculated) : 75.3  Temp (24hrs), Avg:98.1 F (36.7 C), Min:97.4 F (36.3 C), Max:98.4 F (36.9 C)  Recent Labs  Lab 06/30/18 0424 07/01/18 0634 07/02/18 0524 07/03/18 0555 07/04/18 0351  WBC 15.5* 17.1* 18.6* 27.2* 23.3*  CREATININE 0.75 0.90 0.87 1.07 1.02    Estimated Creatinine Clearance: 28.6 mL/min (by C-G formula based on SCr of 1.02 mg/dL).    Allergies  Allergen Reactions  . Penicillins     Lip Swelling  . Zosyn [Piperacillin Sod-Tazobactam So]     Antimicrobials this admission: Ceftriaxone 9/25 >>9/30 Metronidazole 9/28>>9/30 Unasyn 9/30>>10/18 Merrem 10/11>> 10/21 Vanco 10/21>>  Dose adjustments this admission:   Microbiology results:  10/15 BCx: NGTD x5d  UCx:    Sputum:   10/21 MRSA PCR: pending  Thank you for allowing pharmacy to be a part of this patient's care.  Orinda Kenner, PharmD 07/04/2018 6:00 PM
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
## 2018-07-05 LAB — GLUCOSE, CAPILLARY
GLUCOSE-CAPILLARY: 140 mg/dL — AB (ref 70–99)
GLUCOSE-CAPILLARY: 106 mg/dL — AB (ref 70–99)
GLUCOSE-CAPILLARY: 123 mg/dL — AB (ref 70–99)
GLUCOSE-CAPILLARY: 131 mg/dL — AB (ref 70–99)
Glucose-Capillary: 119 mg/dL — ABNORMAL HIGH (ref 70–99)
Glucose-Capillary: 133 mg/dL — ABNORMAL HIGH (ref 70–99)

## 2018-07-05 LAB — BLOOD GAS, ARTERIAL
ACID-BASE DEFICIT: 4.8 mmol/L — AB (ref 0.0–2.0)
ACID-BASE EXCESS: 8 mmol/L — AB (ref 0.0–2.0)
BICARBONATE: 30.4 mmol/L — AB (ref 20.0–28.0)
Bicarbonate: 17.1 mmol/L — ABNORMAL LOW (ref 20.0–28.0)
FIO2: 100
FIO2: 0.36
O2 Saturation: 86.6 %
O2 Saturation: 96.8 %
PCO2 ART: 34 mmHg (ref 32.0–48.0)
PH ART: 7.46 — AB (ref 7.350–7.450)
PH ART: 7.56 — AB (ref 7.350–7.450)
Patient temperature: 37
Patient temperature: 37
pCO2 arterial: 24 mmHg — ABNORMAL LOW (ref 32.0–48.0)
pO2, Arterial: 49 mmHg — ABNORMAL LOW (ref 83.0–108.0)
pO2, Arterial: 76 mmHg — ABNORMAL LOW (ref 83.0–108.0)

## 2018-07-05 LAB — PROCALCITONIN: PROCALCITONIN: 1.5 ng/mL

## 2018-07-05 LAB — CBC
HEMATOCRIT: 32 % — AB (ref 39.0–52.0)
HEMOGLOBIN: 10.4 g/dL — AB (ref 13.0–17.0)
MCH: 26.9 pg (ref 26.0–34.0)
MCHC: 32.5 g/dL (ref 30.0–36.0)
MCV: 82.7 fL (ref 80.0–100.0)
Platelets: 204 10*3/uL (ref 150–400)
RBC: 3.87 MIL/uL — ABNORMAL LOW (ref 4.22–5.81)
RDW: 19.8 % — ABNORMAL HIGH (ref 11.5–15.5)
WBC: 20.7 10*3/uL — ABNORMAL HIGH (ref 4.0–10.5)
nRBC: 0 % (ref 0.0–0.2)

## 2018-07-05 LAB — MAGNESIUM: MAGNESIUM: 2 mg/dL (ref 1.7–2.4)

## 2018-07-05 LAB — BASIC METABOLIC PANEL
Anion gap: 9 (ref 5–15)
BUN: 35 mg/dL — AB (ref 8–23)
CHLORIDE: 102 mmol/L (ref 98–111)
CO2: 31 mmol/L (ref 22–32)
Calcium: 8 mg/dL — ABNORMAL LOW (ref 8.9–10.3)
Creatinine, Ser: 0.8 mg/dL (ref 0.61–1.24)
GFR calc Af Amer: >60 mL/min (ref >60)
GFR calc non Af Amer: >60 mL/min (ref >60)
GLUCOSE: 142 mg/dL — AB (ref 70–99)
POTASSIUM: 3.1 mmol/L — AB (ref 3.5–5.1)
Sodium: 142 mmol/L (ref 135–145)

## 2018-07-05 LAB — PHOSPHORUS: PHOSPHORUS: 1.6 mg/dL — AB (ref 2.5–4.6)

## 2018-07-05 LAB — POTASSIUM: Potassium: 4.3 mmol/L (ref 3.5–5.1)

## 2018-07-05 MED ORDER — POTASSIUM PHOSPHATES 15 MMOLE/5ML IV SOLN
10.0000 mmol | Freq: Once | INTRAVENOUS | Status: AC
  Administered 2018-07-05: 10 mmol via INTRAVENOUS
  Filled 2018-07-05: qty 3.33

## 2018-07-05 MED ORDER — POTASSIUM CHLORIDE 20 MEQ/15ML (10%) PO SOLN
20.0000 meq | ORAL | Status: AC
  Administered 2018-07-05 (×2): 20 meq via ORAL
  Filled 2018-07-05 (×2): qty 15
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
## 2018-07-05 NOTE — Progress Notes (Signed)
Tube feeding rate increased to 35 ml/hl for a goal of 40 ml/hr,no residuals noted and well tolerated,hypokalemia and potassium repleted,wound dressings done,foley catheter patent and intach without s/s of infection,continues on high flow nasal canula.
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
## 2018-07-05 NOTE — Progress Notes (Addendum)
Update 0038: Pt feeding supplement rate change from 34ml/hr to 35ml/hr and tolerating fine. Will continue to monitor.  0320: Pt potassium was at 3.3 from 3.0 initially. Notify prime. Will continue to monitor.

## 2018-07-05 NOTE — Consult Note (Signed)
Pharmacy Electrolyte Monitoring Consult:  Pharmacy consulted to assist in monitoring and replacing electrolytes in this 72 y.o. male admitted on 05/29/2018 with Urinary Retention  Patient admitted to ICU s/p PEG placement. Now on telemetry unit.  Pt with AFib  Labs:  Sodium (mmol/L)  Date Value  07/05/2018 142   Potassium (mmol/L)  Date Value  07/05/2018 3.1 (L)   Magnesium (mg/dL)  Date Value  95/62/1308 2.0   Phosphorus (mg/dL)  Date Value  65/78/4696 1.6 (L)   Calcium (mg/dL)  Date Value  29/52/8413 8.0 (L)   Albumin (g/dL)  Date Value  24/40/1027 2.4 (L)   Corrected Calcium: 9.58  Assessment/Plan: Electrolyte goals: Potassium ~4.0, Magnesium ~2.0 due to afib.  Patient continued on furosemide 20mg  per tube BID  Patient dosed Potassium Chloride solution q2h x 2 per protocol  Recheck potassium at 1800 per protocol  Patient dosed potassium phosphate  No magnesium replenishment warranted at this time  Pharmacy will continue to monitor and adjust per consult.   Albina Billet, PharmD Clinical Pharmacist 07/05/2018 7:22 AM

## 2018-07-05 NOTE — Plan of Care (Signed)
  Problem: Safety: Goal: Ability to remain free from injury will improve Outcome: Progressing   

## 2018-07-05 NOTE — Care Management (Addendum)
RNCM text patient's son Alexander Lara at 64P requesting callback [phone]; Call went straight to voicemail.  Per care team, it has been difficult to reach Alexander Lara for patient progression and patient status. Patient currently receiving high flow supplemental O2. RNCM left message for [facility] to see if patient had any other children name/contact listed with them.  This was discussed with care team, including assistant director with case management. If Alexander Lara, next of kin, does not respond, the next contact would be patient's sister Alexander Lara however, she has request that decisions be made by patient's fiance.

## 2018-07-05 NOTE — Consult Note (Signed)
Pharmacy Electrolyte Monitoring Consult:  Pharmacy consulted to assist in monitoring and replacing electrolytes in this 72 y.o. male admitted on 05/31/2018 with Urinary Retention  Patient admitted to ICU s/p PEG placement. Now on telemetry unit.  Pt with AFib  Labs:  Sodium (mmol/L)  Date Value  07/05/2018 142   Potassium (mmol/L)  Date Value  07/05/2018 4.3   Magnesium (mg/dL)  Date Value  40/98/1191 2.0   Phosphorus (mg/dL)  Date Value  47/82/9562 1.6 (L)   Calcium (mg/dL)  Date Value  13/04/6577 8.0 (L)   Albumin (g/dL)  Date Value  46/96/2952 2.4 (L)   Corrected Calcium: 9.58  Assessment/Plan: Electrolyte goals: Potassium ~4.0, Magnesium ~2.0 due to afib.  Patient continued on furosemide 20mg  per tube BID  10/22 @ 1700 K 4.3 therefore will not replace and will recheck with am labs.  Pharmacy will continue to monitor and adjust per consult.   Orinda Kenner, PharmD Clinical Pharmacist 07/05/2018 7:01 PM

## 2018-07-05 NOTE — Care Management (Signed)
Asked both LTAC facilities for screening on patient, not a candidate due to only having Medicare part A.

## 2018-07-06 DIAGNOSIS — T83511D Infection and inflammatory reaction due to indwelling urethral catheter, subsequent encounter: Secondary | ICD-10-CM

## 2018-07-06 DIAGNOSIS — Z87440 Personal history of urinary (tract) infections: Secondary | ICD-10-CM

## 2018-07-06 DIAGNOSIS — Z8669 Personal history of other diseases of the nervous system and sense organs: Secondary | ICD-10-CM

## 2018-07-06 DIAGNOSIS — N210 Calculus in bladder: Secondary | ICD-10-CM

## 2018-07-06 DIAGNOSIS — N136 Pyonephrosis: Secondary | ICD-10-CM

## 2018-07-06 DIAGNOSIS — M87351 Other secondary osteonecrosis, right femur: Secondary | ICD-10-CM

## 2018-07-06 DIAGNOSIS — M87352 Other secondary osteonecrosis, left femur: Secondary | ICD-10-CM

## 2018-07-06 DIAGNOSIS — I82622 Acute embolism and thrombosis of deep veins of left upper extremity: Secondary | ICD-10-CM

## 2018-07-06 DIAGNOSIS — I6203 Nontraumatic chronic subdural hemorrhage: Secondary | ICD-10-CM

## 2018-07-06 DIAGNOSIS — B967 Clostridium perfringens [C. perfringens] as the cause of diseases classified elsewhere: Secondary | ICD-10-CM

## 2018-07-06 DIAGNOSIS — Z960 Presence of urogenital implants: Secondary | ICD-10-CM

## 2018-07-06 DIAGNOSIS — R918 Other nonspecific abnormal finding of lung field: Secondary | ICD-10-CM

## 2018-07-06 DIAGNOSIS — Z9889 Other specified postprocedural states: Secondary | ICD-10-CM

## 2018-07-06 DIAGNOSIS — B964 Proteus (mirabilis) (morganii) as the cause of diseases classified elsewhere: Secondary | ICD-10-CM

## 2018-07-06 DIAGNOSIS — Z931 Gastrostomy status: Secondary | ICD-10-CM

## 2018-07-06 LAB — GLUCOSE, CAPILLARY
GLUCOSE-CAPILLARY: 126 mg/dL — AB (ref 70–99)
GLUCOSE-CAPILLARY: 154 mg/dL — AB (ref 70–99)
GLUCOSE-CAPILLARY: 95 mg/dL (ref 70–99)
Glucose-Capillary: 134 mg/dL — ABNORMAL HIGH (ref 70–99)
Glucose-Capillary: 143 mg/dL — ABNORMAL HIGH (ref 70–99)
Glucose-Capillary: 170 mg/dL — ABNORMAL HIGH (ref 70–99)

## 2018-07-06 LAB — PHOSPHORUS: PHOSPHORUS: 1.9 mg/dL — AB (ref 2.5–4.6)

## 2018-07-06 LAB — POTASSIUM: Potassium: 3.5 mmol/L (ref 3.5–5.1)

## 2018-07-06 LAB — MAGNESIUM: MAGNESIUM: 2.1 mg/dL (ref 1.7–2.4)

## 2018-07-06 MED ORDER — POTASSIUM & SODIUM PHOSPHATES 280-160-250 MG PO PACK
2.0000 | PACK | ORAL | Status: AC
  Administered 2018-07-06 (×4): 2
  Filled 2018-07-06 (×4): qty 2

## 2018-07-06 MED ORDER — POTASSIUM CHLORIDE 20 MEQ/15ML (10%) PO SOLN
20.0000 meq | Freq: Once | ORAL | Status: AC
  Administered 2018-07-06: 20 meq
  Filled 2018-07-06: qty 15

## 2018-07-06 MED ORDER — OSMOLITE 1.5 CAL PO LIQD
1000.0000 mL | ORAL | Status: DC
  Administered 2018-07-06: 1000 mL

## 2018-07-06 NOTE — Progress Notes (Signed)
CCMD called and reported that pt have 14 beats of SVT and HR went up to 163 for 10 seconds then went back down to HR 97. Pt asymptomatic and VSS. Notify [employer]. Will continue to monitor.

## 2018-07-06 NOTE — Progress Notes (Signed)
Changed patient over to 6l nasal cannula from heated high flow nasal cannula

## 2018-07-06 NOTE — Progress Notes (Signed)
Decreased to 3l [place]. 

## 2018-07-06 NOTE — Clinical Social Work Note (Signed)
3:30pm Patient's sister Alexander Lara was visiting patient and requested to speak with CSW.  CSW spoke to sister, who said patient's fiance told her that he would be going to [facility], CSW informed sister that patient is not able to go to [facility] because he does not have Medicare Part B and both LTACHs confirmed they will not take patient.  CSW also informed patient's sister that his fiance, is agreeable to patient going to either [facility] or [facility].  3:45pm CSW spoke with patient's fiance Alexander Lara and informed her that patient will not be able to get approved for [facility] per [facility] facilities because he does not have Medicare Part B.  CSW informed fiance that patient has possible placement at [facility] for short term, but they do not have long term beds available and patient would have to transition to long term care somewhere.  CSW also told patient's fiance that patient is on 3L of oxygen now, and may be ready for discharge to SNF by the end of the week depending on what physician decides.  Patient's fiance asked if she does not agree to either of the SNFs what are her other options, and CSW informed her she would have to take patient home and private pay for some caregivers.  Fiance said she is willing to consider having patient going to [facility] patient would not have to have more then one transitions, CSW provided support for patient's fiance and told her that CSW agrees that minimizing the amounts of transitions would be better for patient.  Patient's fiancee asked if CSW had spoken to [facility] worker, and CSW informed her that CSW did speak with Medicaid worker earlier in the week and CSW was given the Medicaid pending number.    CSW spoke to [facility] and they said they do not currently have any long term Medicaid beds available, but they will discuss how long patient can be skilled under Medicare A and can still potentially take patient if fiance agrees.  CSW also spoke with  [facility] and they informed CSW they do not have any long term care beds available, and once patient is finished with his Medicare benefits, he will have to transition to a different SNF.  CSW to continue to follow patient's progress throughout discharge planning.  Alexander Lara. Alexander Lara, MSW, Theresia Majors [phone]  07/06/2018 5:20 PM

## 2018-07-06 NOTE — Progress Notes (Signed)
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  On admission he was nonverbal and cachectic and his vitals were pulse of 105, temperature of 98.7, blood pressure of 138/76.  He had decreased breath sounds in the right lower field and left lower feet.  He he had a WBC of 11.5 and hemoglobin of 9.9 on admission.  He had a blood glucose of 108 and creatinine of 1 on admission he was admitted as recurrent UTI and was started on ceftriaxone.  Blood cultures and urine cultures were sent before starting antibiotic.  The blood culture came back positive for Proteus and Clostridium species.  The urine culture had Proteus.  He was switched to Unasyn on 06/13/2018 his hospital stay has been complicated by the following.  Because of failure to thrive they attempted to place PEG on 07/13/2018 which was complicated by the blade piercing through the stomach and the procedure was abandoned.  Following which she developed respiratory distress and also his had to be intubated.  The x-ray abdomen showed air.  He was taken for urgent laparotomy on 06/22/2018.  Gastric perforation was noted which was repaired and the PEG was placed.  There was also a hemoglobin drop up to 5.6 and he was given PRBC.  He was in the ICU.  He got extubated on 06/23/2018.  And tube feeds started on 06/24/2018 .on 06/26/2018 he had to be reintubated for respiratory distress and suspicion for aspiration.  He was started on meropenem on 06/26/2018  He also was diagnosed with DVT of the left upper extremity and nonocclusive subclavian DVT associated with PICC line by ultrasound. I am asked to see the patient as he has had low-grade fever and some leukocytosis  in spite of being on antibiotics.  Antibiotic regimen in the hospital as follows Ceftriaxone Jun 27, 2018 until 06/13/2018 Flagyl 928 10/23/1928 Unasyn 9 3210 8 Zosyn 10 8-10 11 Vancomycin 10/ 8/ 19 dose Meropenem since 06/24/2018-07/04/18  Status quo  Objective:  VITALS:  Patient Vitals for the past 24 hrs:  BP Temp Temp src Pulse Resp SpO2 Weight  07/06/18 1400 - - - - - 97 % -  07/06/18 1345 - - - - - 100 % -  07/06/18 1030 - - - - - 100 % -  07/06/18 0845 - - - - - 97 % -  07/06/18 0831 - - - - - 97 % -  07/06/18 0755 (!) 126/59 98.5 F (36.9 C) Oral 94 19 95 % -  07/06/18 0700 - - - 91 - - -  07/06/18 0300 - - - - - - 29.5 kg  07/06/18 0252 126/65 98.9 F (37.2 C) Oral (!) 49 - 94 % -  07/06/18 0203 - - - - - 94 % -  07/05/18 2209 - - - 100 - - -  07/05/18 2205 - - - (!) 51 - 93 % -  07/05/18 2034 - - - - - 93 % -  07/05/18 2011 124/84 98.4 F (36.9 C) Oral 94 18 93 % -  07/05/18 1938 - - - - - 96 % -  07/05/18 1658 124/78 - - 84 - 96 % -   PHYSICAL EXAM:  General: Emaciated, lethargic, non verbal, opens eyes on calling  his name Lungs: b/l air entry- decreased bases Heart: s1s2 Abdomen: Soft,PEG, surgical scar healthy . No masses Extremities: bruises arms Skin: bruises Lymph: Cervical, supraclavicular normal. Neurologic: did not examine in detail foley Pertinent Labs CBC Latest Ref Rng & Units 07/05/2018 07/04/2018 07/03/2018  WBC 4.0 - 10.5 K/uL 20.7(H) 23.3(H) 27.2(H)  Hemoglobin 13.0 - 17.0 g/dL 10.4(L) 10.6(L) 10.8(L)  Hematocrit 39.0 - 52.0 % 32.0(L) 32.6(L) 32.9(L)  Platelets 150 - 400 K/uL 204 176 182   CMP Latest Ref Rng & Units 07/06/2018 07/05/2018 07/05/2018  Glucose 70 - 99 mg/dL - - 161(W)  BUN 8 - 23 mg/dL - - 96(E)  Creatinine 4.54 - 1.24 mg/dL - - 0.98  Sodium 119 - 145 mmol/L - - 142  Potassium 3.5 - 5.1 mmol/L 3.5 4.3 3.1(L)  Chloride 98 - 111 mmol/L - - 102  CO2 22 - 32 mmol/L - - 31  Calcium 8.9 - 10.3 mg/dL - - 8.0(L)  Total Protein  6.5 - 8.1 g/dL - - -  Total Bilirubin 0.3 - 1.2 mg/dL - - -  Alkaline Phos 38 - 126 U/L - - -  AST 15 - 41 U/L - - -  ALT 0 - 44 U/L - - -      IMAGING RESULTS: ? 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  In Hospital since 05/23/2018  Low grade fever and leucocytosis- CT scan  showed Bladder distension, hydronephrosis (inspite of foley) and bladder stones. Foley catheter changed. Clear urine  Chronic changes lower lobes- reviewed serial CXR -no new infiltrates in the past days. Likely atelectasis VS aspiration- off antibioics since 10/21 and stable Clostridium species /proteus bacteremia on admission- treated. Risk of colon cancer with clostridium.  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 Discussed with his nurse and Dr.Chen

## 2018-07-06 NOTE — Consult Note (Signed)
WOC Nurse wound consult note Reason for Consult:Malnutrition and stage 2 pressure injury to sacrum, present on admission.  Wound type:pressure in the setting of severe malnutrition. Is on tube feeds at this time.  Albumin 2.5 Pressure Injury POA: Yes Measurement: 2 cmx 2 cm  Erythema with 0.5 cm nonintact center, depth of 0.1 cm Wound WUJ:WJXB and moist Drainage (amount, consistency, odor) minimal serosanguinous Periwound:intact  Thin, bony prominences at risk due to pressure and malnutrition Dressing procedure/placement/frequency:Cleanse sacral wound with soap and water and pat dry.  Apply sacral silicone foam.  Change every three days and PRN soilage.  Will not follow at this time.  Please re-consult if needed.  Maple Hudson MSN, RN, FNP-BC CWON Wound, Ostomy, Continence Nurse Pager [phone]

## 2018-07-06 NOTE — Consult Note (Signed)
Pharmacy Electrolyte Monitoring Consult:  Pharmacy consulted to assist in monitoring and replacing electrolytes in this 72 y.o. male admitted on June 21, 2018 with Urinary Retention  Patient admitted to ICU s/p PEG placement. Now on telemetry unit.  Pt with AFib  Labs:  Sodium (mmol/L)  Date Value  07/05/2018 142   Potassium (mmol/L)  Date Value  07/06/2018 3.5   Magnesium (mg/dL)  Date Value  16/06/9603 2.1   Phosphorus (mg/dL)  Date Value  54/05/8118 1.9 (L)   Calcium (mg/dL)  Date Value  14/78/2956 8.0 (L)   Albumin (g/dL)  Date Value  21/30/8657 2.4 (L)   Corrected Calcium: 9.58  Assessment/Plan: Electrolyte goals: Potassium ~4.0, Magnesium ~2.0 due to afib.  Patient continued on furosemide 20mg  per tube BID  Patient dosed Potassium Chloride solution per tube Patient dosed Potassium Phosphate packets (Phos-Nak) 2 packets via tube q4hours x 4 No magnesium replenishment warranted at this time  Rechecking electrolytes with AM labs  Pharmacy will continue to monitor and adjust per consult.   Albina Billet, PharmD Clinical Pharmacist 07/06/2018 7:14 AM
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
## 2018-07-06 NOTE — Plan of Care (Signed)
  Problem: Safety: Goal: Ability to remain free from injury will improve Outcome: Progressing   Problem: Urinary Elimination: Goal: Signs and symptoms of infection will decrease Outcome: Progressing   

## 2018-07-07 LAB — GLUCOSE, CAPILLARY: GLUCOSE-CAPILLARY: 110 mg/dL — AB (ref 70–99)

## 2018-07-15 NOTE — Progress Notes (Signed)
Chaplain was called back to room for grief support of deceased Pt. Fiance and sister were met at nurse's station. Chaplain escorted them to view Pt. Sister and fiance had tension in the grief process. Anger was expressed by both. Fiance expressed blame on herself , staff, family, and all that came to mind. Chaplain practiced pastoral presence until fiance was ready to be supported. Chaplain comforted sister until fiance wanted others to enter her space. Chaplain sought to have an empathic presence as family went throught the grieving process. Once sister left ,fiance was able to move to the next step to say goodbye. Chaplain escorted fiance to her car. What started out as a rejection of support became a much appreciated presence.    2018/07/19 0600  Clinical Encounter Type  Visited With Patient and family together  Visit Type Death  Referral From Nurse  Spiritual Encounters  Spiritual Needs Grief support

## 2018-07-15 NOTE — Care Management (Addendum)
This RNCM has notified [facility] of date and time of death. RNCM attempted to reach out to son [name] to share condolences but there was no answer.
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
## 2018-07-15 NOTE — ED Provider Notes (Signed)
[facility] Department of Emergency Medicine   Code Blue CONSULT NOTE  Chief Complaint: Cardiac arrest/unresponsive   Level V Caveat: Unresponsive  History of present illness: I was contacted by the hospital for a CODE BLUE cardiac arrest upstairs and presented to the patient's bedside.  The patient was not breathing and had no pulse.  Severely cachectic/malnourished at 29.5 kg.  Patient has orders for limited code including intubation if respiratory arrest but no chest compressions.  ROS: Unable to obtain, Level V caveat  Scheduled Meds: . amiodarone  200 mg Oral Daily  . chlorhexidine  15 mL Mouth Rinse BID  . chlorhexidine gluconate (MEDLINE KIT)  15 mL Mouth Rinse BID  . famotidine  20 mg Per Tube BID  . free water  30 mL Per Tube Q4H  . furosemide  20 mg Per Tube BID  . heparin injection (subcutaneous)  5,000 Units Subcutaneous Q8H  . insulin aspart  0-9 Units Subcutaneous TID WC  . ipratropium  0.5 mg Nebulization TID  . levETIRAcetam  500 mg Per Tube BID  . mouth rinse  15 mL Mouth Rinse q12n4p  . metoprolol tartrate  12.5 mg Per Tube BID  . multivitamin  15 mL Per Tube Daily  . predniSONE  20 mg Per Tube Q breakfast  . sodium chloride flush  10-40 mL Intracatheter Q12H  . vitamin C  250 mg Per Tube BID   Continuous Infusions: . sodium chloride Stopped (07/03/18 0546)  . feeding supplement (OSMOLITE 1.5 CAL) 1,000 mL (07/06/18 0939)   PRN Meds:.sodium chloride, acetaminophen, albuterol, fentaNYL (SUBLIMAZE) injection, metoprolol tartrate, oxyCODONE-acetaminophen, polyvinyl alcohol, sodium chloride, sodium chloride flush Past Medical History:  Diagnosis Date  . Anxiety   . Emphysema of lung ([place])   . Foley catheter in place   . Seizures ([place])   . Subdural hematoma ([place])   . Urinary retention    Past Surgical History:  Procedure Laterality Date  . LAPAROTOMY N/A 06/17/2018   Procedure: EXPLORATORY LAPAROTOMY, gastric repair, gastrostomy tube  placement;  Surgeon: Herbert Pun, MD;  Location: [facility] ORS;  Service: General;  Laterality: N/A;  . PEG PLACEMENT N/A 07/12/2018   Procedure: PERCUTANEOUS ENDOSCOPIC GASTROSTOMY (PEG) PLACEMENT;  Surgeon: Virgel Manifold, MD;  Location: [facility] ENDOSCOPY;  Service: Endoscopy;  Laterality: N/A;   Social History   Socioeconomic History  . Marital status: Divorced    Spouse name: Not on file  . Number of children: Not on file  . Years of education: Not on file  . Highest education level: Not on file  Occupational History  . Not on file  Social Needs  . Financial resource strain: Not on file  . Food insecurity:    Worry: Not on file    Inability: Not on file  . Transportation needs:    Medical: Not on file    Non-medical: Not on file  Tobacco Use  . Smoking status: Former Research scientist (life sciences)  . Smokeless tobacco: Never Used  Substance and Sexual Activity  . Alcohol use: No    Frequency: Never  . Drug use: No  . Sexual activity: Not on file  Lifestyle  . Physical activity:    Days per week: Not on file    Minutes per session: Not on file  . Stress: Not on file  Relationships  . Social connections:    Talks on phone: Not on file    Gets together: Not on file    Attends religious service: Not on file  Active member of club or organization: Not on file    Attends meetings of clubs or organizations: Not on file    Relationship status: Not on file  . Intimate partner violence:    Fear of current or ex partner: Not on file    Emotionally abused: Not on file    Physically abused: Not on file    Forced sexual activity: Not on file  Other Topics Concern  . Not on file  Social History Narrative  . Not on file   Allergies  Allergen Reactions  . Penicillins     Lip Swelling  . Zosyn [Piperacillin Sod-Tazobactam So]     Last set of Vital Signs (not current) Vitals:   07/06/18 1947 07/06/18 2045  BP: 133/70   Pulse: 93   Resp: 18   Temp: 98.5 F (36.9 C)   SpO2: 100%  99%      Physical Exam  Gen: unresponsive Cardiovascular: pulseless  Resp: apneic. Breath sounds equal bilaterally with bagging  Abd: Grave cachexia Neuro: GCS 3, unresponsive to pain  HEENT: No blood in posterior pharynx, gag reflex absent  Neck: No crepitus  Musculoskeletal: No deformity  Skin: warm   CRITICAL CARE Performed by: Hinda Kehr Total critical care time: < 30 mins Critical care time was exclusive of separately billable procedures and treating other patients. Critical care was necessary to treat or prevent imminent or life-threatening deterioration. Critical care was time spent personally by me on the following activities: development of treatment plan with patient and/or surrogate as well as nursing, discussions with consultants, evaluation of patient's response to treatment, examination of patient, obtaining history from patient or surrogate, ordering and performing treatments and interventions, ordering and review of laboratory studies, ordering and review of radiographic studies, pulse oximetry and re-evaluation of patient's condition.   MDM/Assessment and Plan   Patient has severe malnutrition/cachexia and is obviously critically ill at baseline.  At the time I responded to the CODE BLUE I was informed by multiple nurses who care for him that the patient has orders for a limited code which indicate intubation if necessary for respiratory failure, but with strict instructions for no chest compressions.  Intubation in the setting of cardiac arrest would be futile, and unfortunately a survey of the patient's status indicates that any attempts at resuscitation, even with CPR, would be futile.  The hospitalist, Dr. Jodell Cipro, was also at the bedside during my evaluation.  We agreed that it would be appropriate to discontinue any resuscitative efforts as per the request on file and Dr. Jodell Cipro declared time of death as documented in the separate nursing notes.   Hinda Kehr, MD 10-Jul-2018 [phone]

## 2018-07-15 NOTE — Progress Notes (Signed)
Patient's NT found patient unresponsive during purposeful rounding  and called the care RN, on getting to the patient's room patient has no pulse. Dr. Collier Flowers, and Helmut Muster ( the nursing supervisor) and the charge  nurse , respiratory tech and chaplain were called to patient's room. Dr. Barbaraann Rondo pronounced patient dead at 0314 AM on Jul 28, 2018 . Dr. Marjie Skiff called the patient's sister, and [place] donor was called by the care RN.

## 2018-07-15 NOTE — Progress Notes (Signed)
Code Blue called shortly after 3AM. Pt was admitted by me on 09/24. He has deteriorated since admission, but has had a larger overall global deterioration from well before his admission (x~8-2mo). Pt was designated as partial code as of 10/11 (no CPR, may intubate). Pt was found to be pulseless. Two physicians, ED provider (Dr. York Cerise) and myself both agreed on the determination that CPR would represent a medically futile measure. This was consistent w/ pt's code status. Time of death 0314AM on 08/02/2018.  -P. Khing Belcher (Nocturnist)

## 2018-07-15 NOTE — Progress Notes (Signed)
Chaplain responded to a code blue. Upon arrival patient had been called. Fiance was being notified. Chaplain will be called when she arrives.    07-15-18 0300  Clinical Encounter Type  Visited With Patient  Visit Type Code  Referral From Care management

## 2018-07-15 DEATH — deceased

## 2019-10-06 IMAGING — DX DG CHEST 1V PORT
1 series · 1 of 1 positions shown · non-contrast
Comparison: Chest x-ray 06/23/2018.

CLINICAL DATA: 72-year-old with history of shortness of breath.
History of emphysema.

EXAM:
PORTABLE CHEST 1 VIEW

[chest ap]
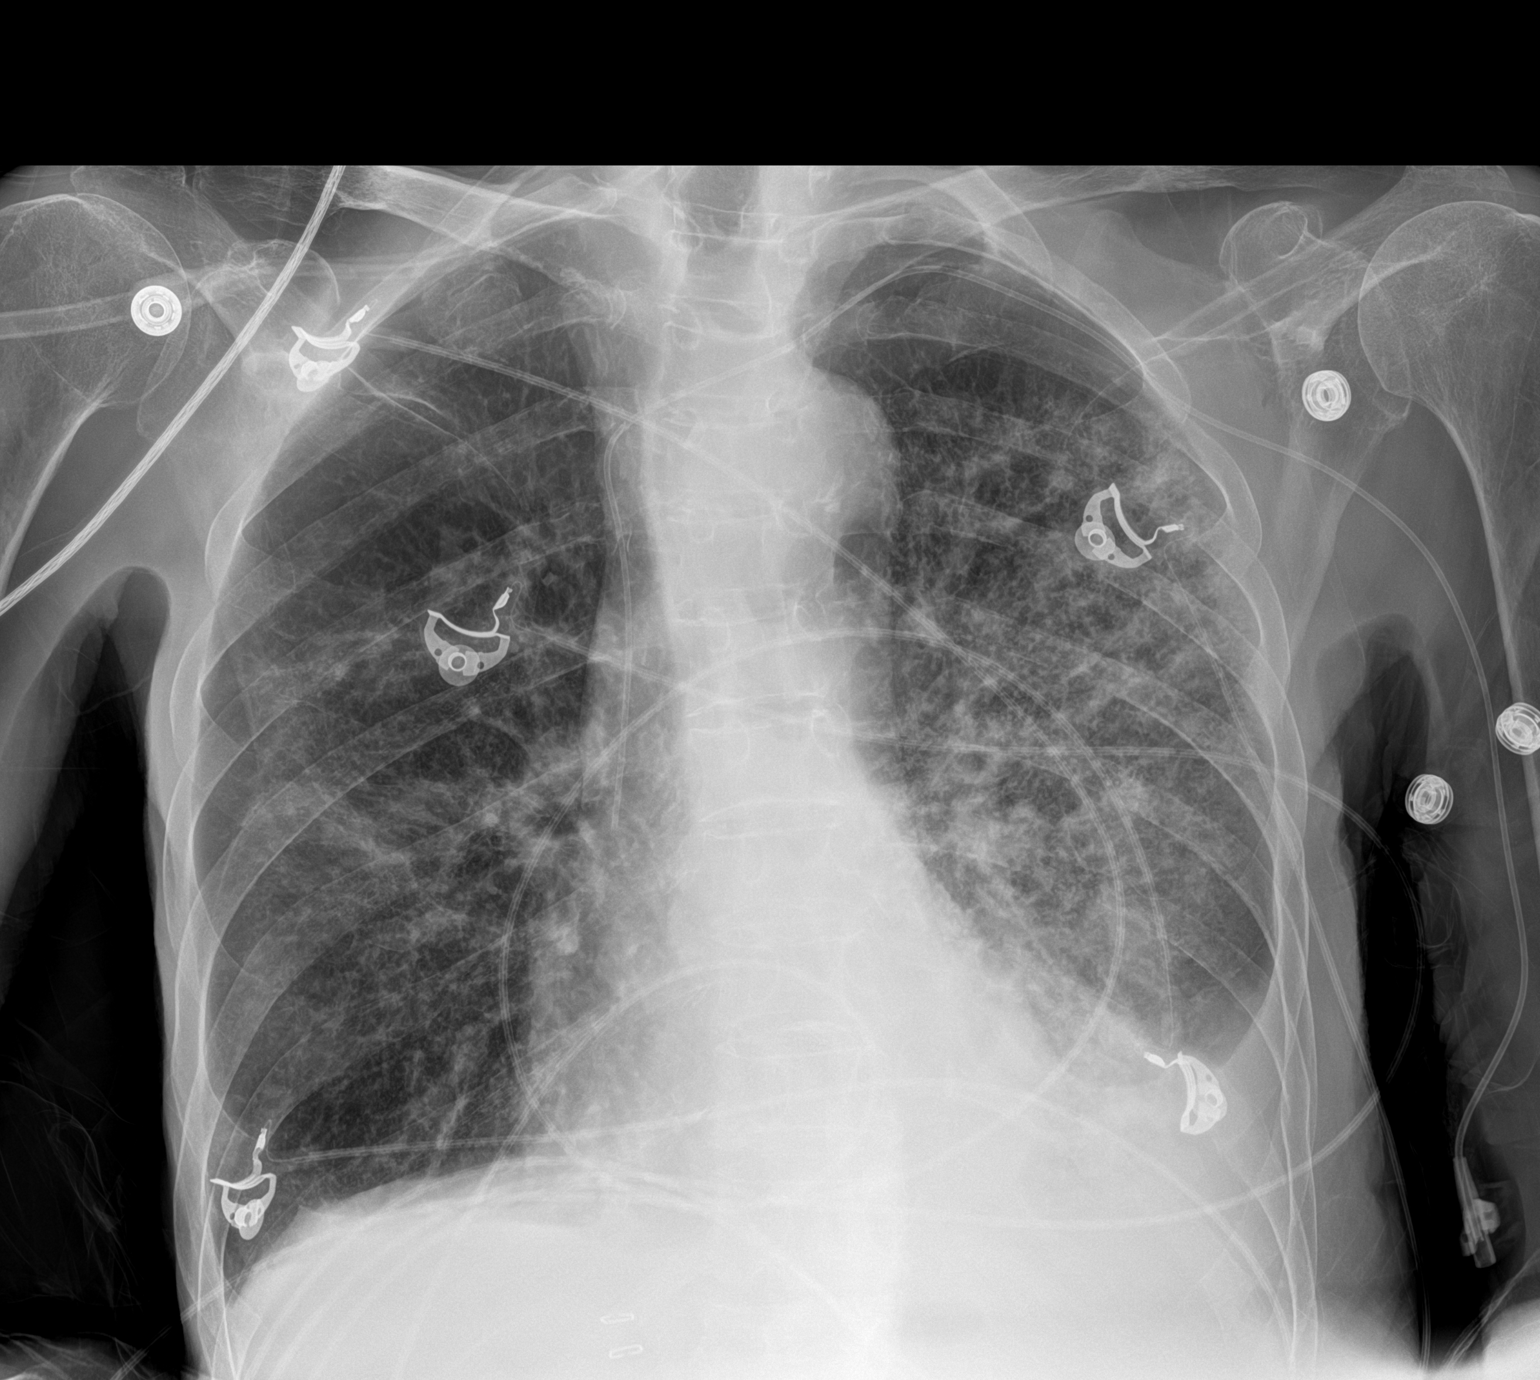

[1 of 1 positions shown; findings below may reference images not displayed]

FINDINGS: There is a left upper extremity PICC with tip terminating in the
distal superior vena cava. Previously noted endotracheal tube has
been withdrawn. Diffuse peribronchial cuffing with patchy multifocal
ill-defined airspace consolidation in the lungs bilaterally, most
severe throughout the left upper lobe. Moderate left pleural
effusion. Small right pleural effusion. Opacity at the left lung
base which may reflect atelectasis and/or consolidation. Pulmonary
vasculature does not appear engorged. Heart size is normal. Upper
mediastinal contours are within normal limits. Aortic
atherosclerosis.
IMPRESSION: 1. Support apparatus, as above.
2. Worsening bronchitis with developing multilobar bronchopneumonia
in the lungs bilaterally (left greater than right).
3. Increasing moderate left and small right pleural effusion.

## 2019-10-07 IMAGING — DX DG ABDOMEN 1V
2 series · 2 of 2 positions shown · non-contrast
Comparison: 06/26/2018; 06/22/2018; CT abdomen and
pelvis-06/13/2018

CLINICAL DATA: Vomiting

EXAM:
ABDOMEN - 1 VIEW

[abdomen supine (1 of 2)]
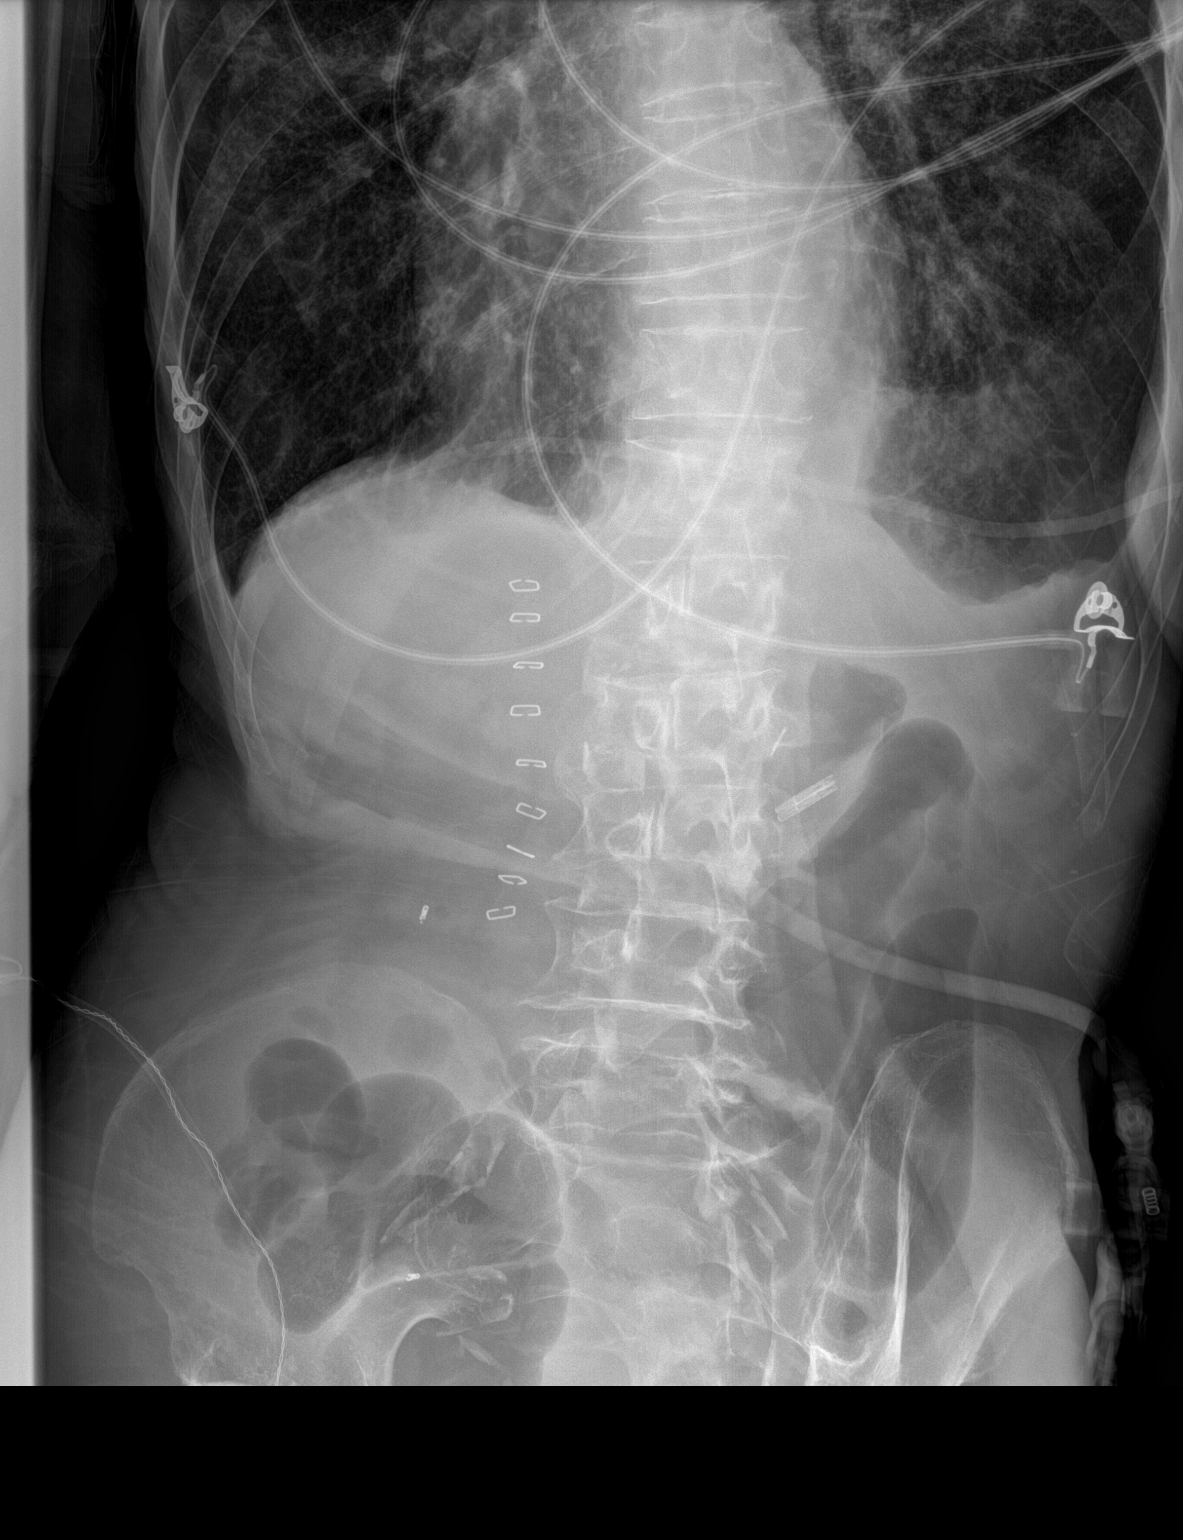

[abdomen supine (2 of 2)]
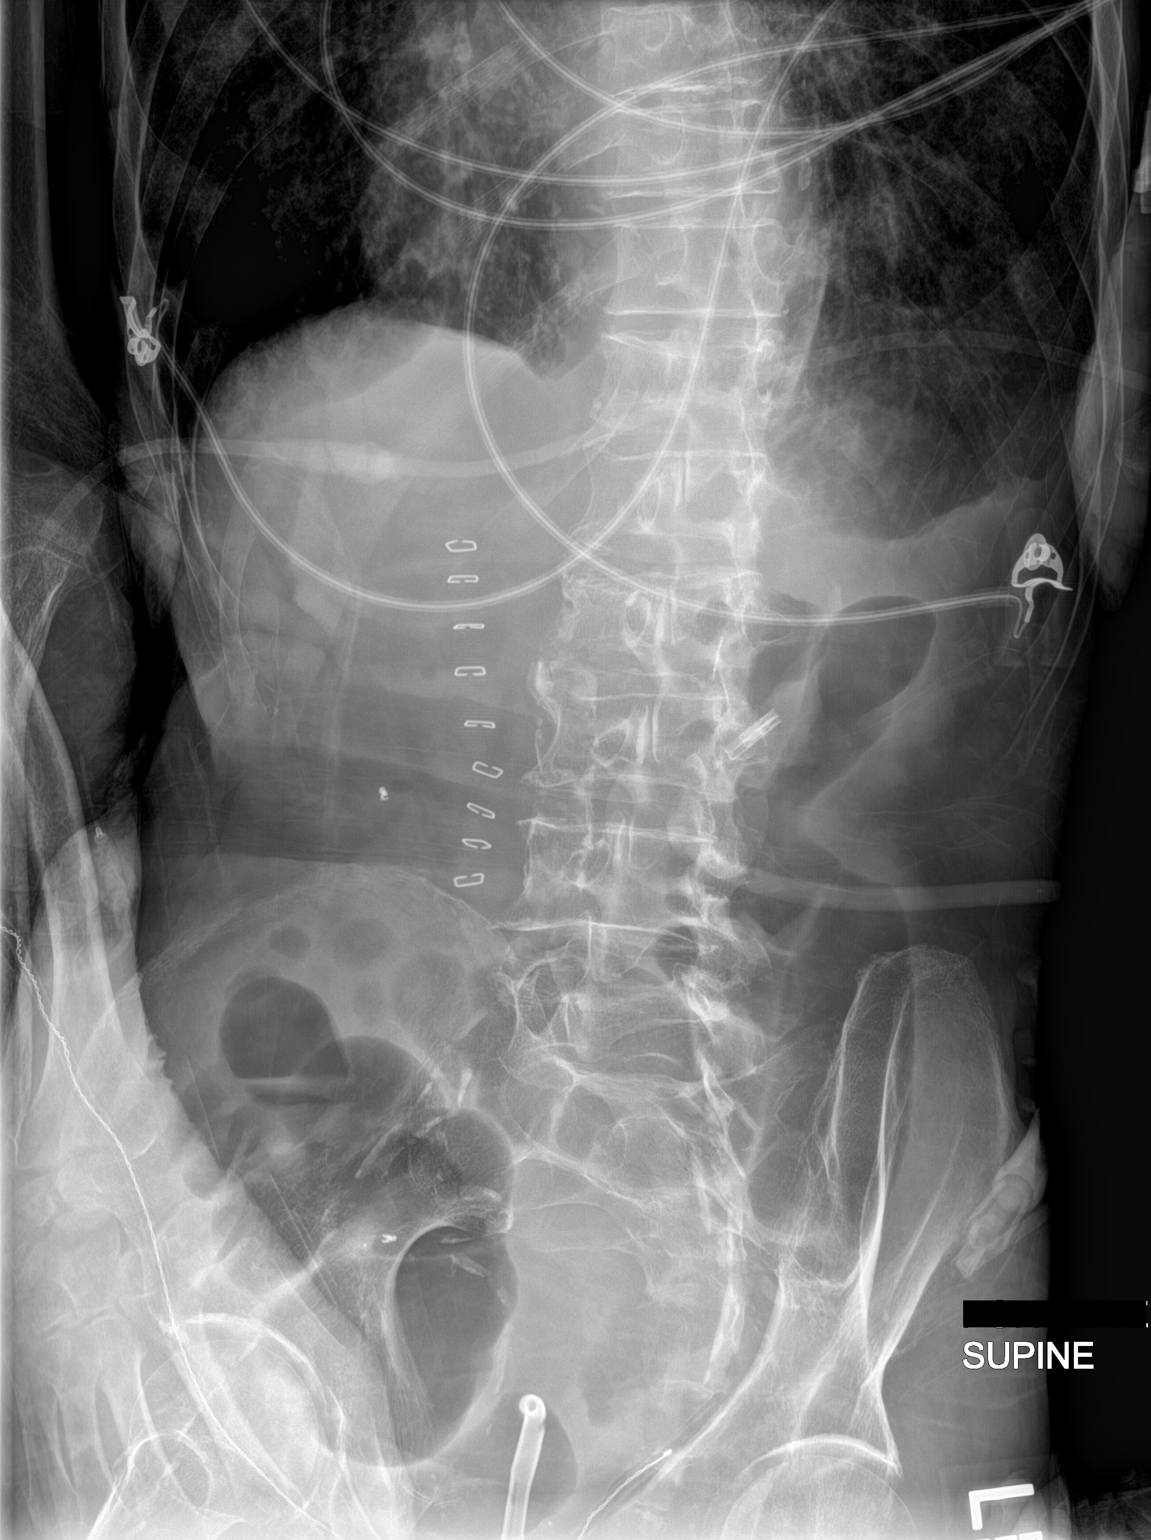

[2 of 2 positions shown; findings below may reference images not displayed]

FINDINGS: Re-demonstrated mild patulous distension of the colon, grossly
unchanged. No definite evidence of enteric obstruction. No supine
evidence of pneumoperitoneum. No pneumatosis or portal venous gas.

Gastrostomy tube overlies expected location of mid body of the
stomach. Rectal tube overlies expected location of the rectum.

No definitive abnormal intra-abdominal calcifications

Limited visualization of lower thorax suggests lung hyperexpansion
with potential trace bilateral effusions, incompletely evaluated.

Re-demonstrated age-indeterminate compression deformities of the T12
and L2 vertebral bodies, incompletely evaluated.
IMPRESSION: Similar findings most suggestive of ileus.

## 2019-10-11 IMAGING — DX DG ABD PORTABLE 1V
1 series · 1 of 1 positions shown · non-contrast
Comparison: 06/30/2018

CLINICAL DATA: Follow-up ileus

EXAM:
PORTABLE ABDOMEN - 1 VIEW

[abdomen supine]
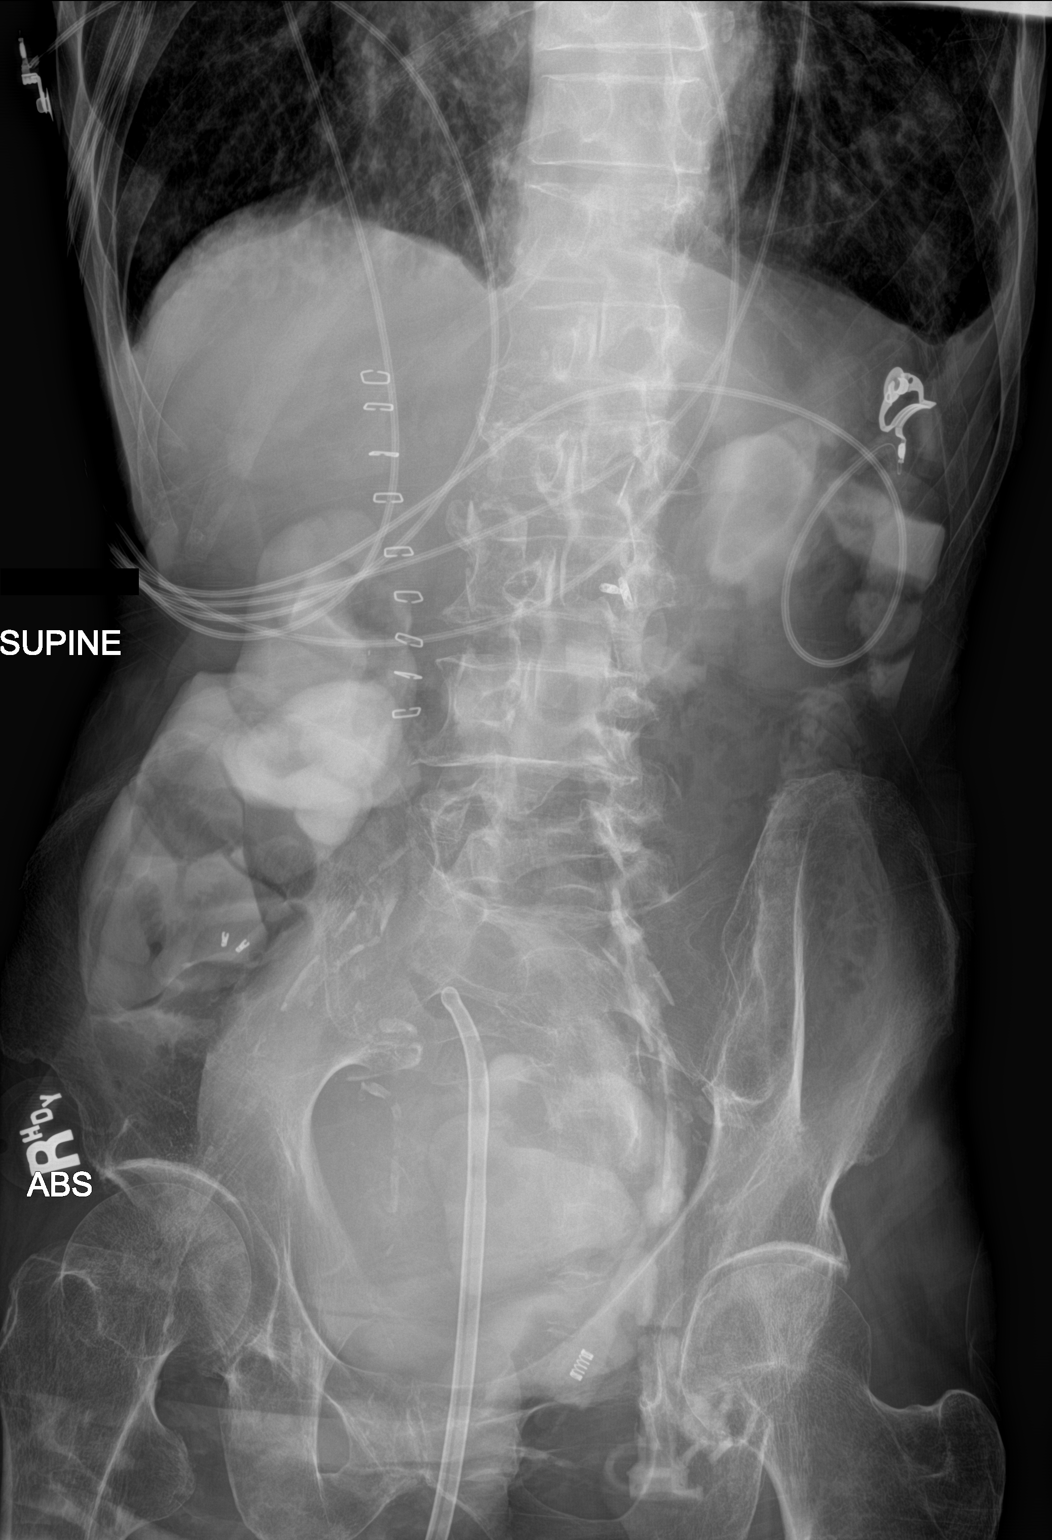

[1 of 1 positions shown; findings below may reference images not displayed]

FINDINGS: Scattered large and small bowel gas is noted. No obstructive changes
are seen. No free air is noted. Urinary catheter is noted in the
midline. Diffuse vascular calcifications are seen. Gastrostomy
catheter is noted over the midline.
IMPRESSION: No obstructive changes are noted.

## 2019-10-12 IMAGING — DX DG CHEST 1V PORT
1 series · 1 of 1 positions shown · non-contrast
Comparison: 06/30/2018 chest radiograph.

CLINICAL DATA: Cough

EXAM:
PORTABLE CHEST 1 VIEW

[chest ap]
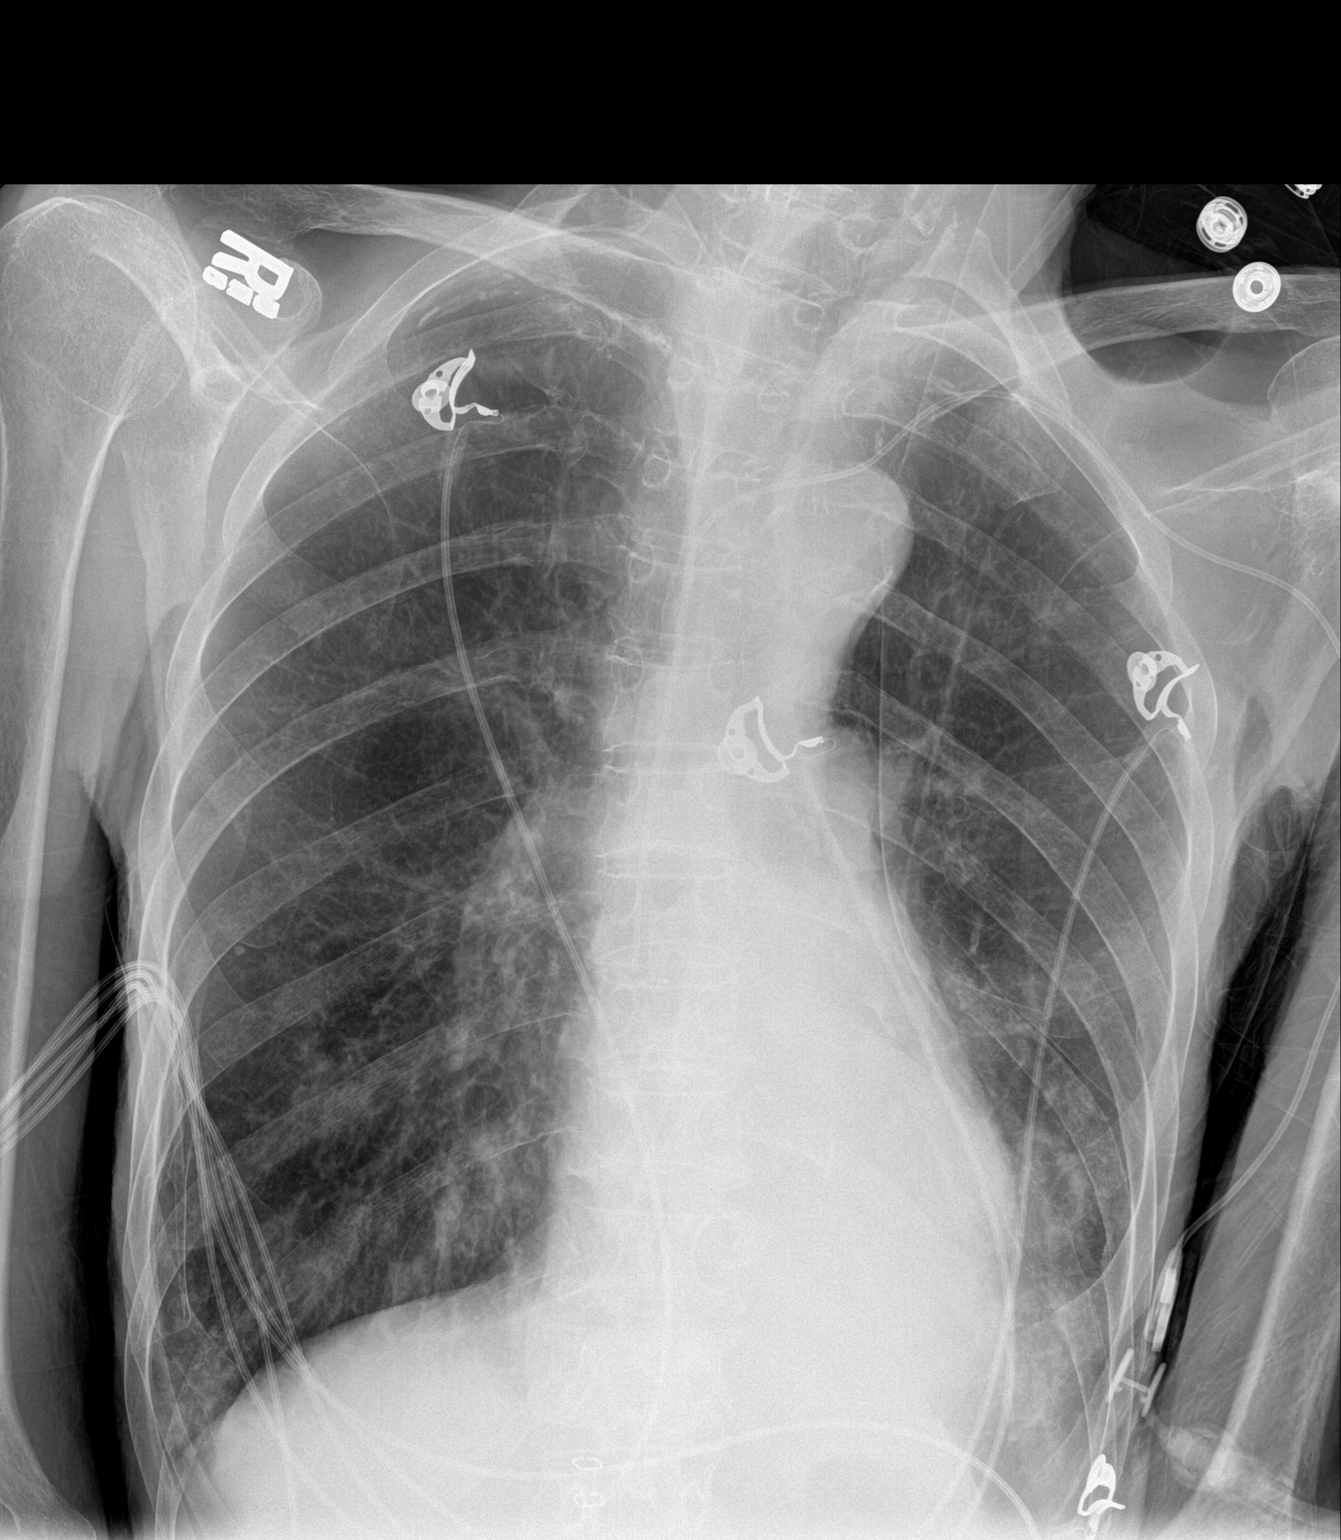

[1 of 1 positions shown; findings below may reference images not displayed]

FINDINGS: Left PICC terminates at the cavoatrial junction. Stable
cardiomediastinal silhouette with normal heart size. No
pneumothorax. Hyperinflated lungs. Small dependent left pleural
effusion is stable. Emphysema. No pulmonary edema. Worsened dense
left retrocardiac consolidation. Stable mild patchy right lung base
opacity.
IMPRESSION: 1. Worsened dense left retrocardiac consolidation and stable mild
patchy right lung base opacity suggesting worsening multilobar
pneumonia.
2. Stable small left pleural effusion.
3. Hyperinflated lungs and emphysema, suggesting COPD.
